# Patient Record
Sex: Female | Born: 1935
Health system: Southern US, Community
[De-identification: ages and names within clinical notes are randomized; demographics above are authoritative.]

## PROBLEM LIST (undated history)

## (undated) DIAGNOSIS — I4891 Unspecified atrial fibrillation: Secondary | ICD-10-CM

## (undated) DIAGNOSIS — I639 Cerebral infarction, unspecified: Secondary | ICD-10-CM

## (undated) DIAGNOSIS — K2981 Duodenitis with bleeding: Secondary | ICD-10-CM

## (undated) DIAGNOSIS — I63411 Cerebral infarction due to embolism of right middle cerebral artery: Secondary | ICD-10-CM

## (undated) DIAGNOSIS — I63422 Cerebral infarction due to embolism of left anterior cerebral artery: Secondary | ICD-10-CM

## (undated) DIAGNOSIS — I10 Essential (primary) hypertension: Secondary | ICD-10-CM

## (undated) DIAGNOSIS — C50912 Malignant neoplasm of unspecified site of left female breast: Secondary | ICD-10-CM

## (undated) DIAGNOSIS — S42201A Unspecified fracture of upper end of right humerus, initial encounter for closed fracture: Secondary | ICD-10-CM

## (undated) DIAGNOSIS — F0390 Unspecified dementia without behavioral disturbance: Secondary | ICD-10-CM

## (undated) DIAGNOSIS — I63433 Cerebral infarction due to embolism of bilateral posterior cerebral arteries: Secondary | ICD-10-CM

## (undated) DIAGNOSIS — N183 Chronic kidney disease, stage 3 unspecified: Secondary | ICD-10-CM

## (undated) DIAGNOSIS — A048 Other specified bacterial intestinal infections: Secondary | ICD-10-CM

## (undated) DIAGNOSIS — K254 Chronic or unspecified gastric ulcer with hemorrhage: Secondary | ICD-10-CM

## (undated) DIAGNOSIS — D649 Anemia, unspecified: Secondary | ICD-10-CM

## (undated) DIAGNOSIS — M1990 Unspecified osteoarthritis, unspecified site: Secondary | ICD-10-CM

## (undated) HISTORY — DX: Cerebral infarction, unspecified: I63.9

## (undated) HISTORY — DX: Cerebral infarction due to embolism of left anterior cerebral artery: I63.422

## (undated) HISTORY — DX: Unspecified atrial fibrillation: I48.91

## (undated) HISTORY — PX: HIP SURGERY: SHX245

## (undated) HISTORY — DX: Cerebral infarction due to embolism of bilateral posterior cerebral arteries: I63.433

## (undated) HISTORY — PX: KNEE ARTHROSCOPY: SUR90

## (undated) HISTORY — DX: Cerebral infarction due to embolism of right middle cerebral artery: I63.411

## (undated) HISTORY — DX: Unspecified fracture of upper end of right humerus, initial encounter for closed fracture: S42.201A

## (undated) HISTORY — DX: Other specified bacterial intestinal infections: A04.8

## (undated) HISTORY — PX: MASTECTOMY: SHX3

## (undated) HISTORY — PX: ABDOMINAL HYSTERECTOMY: SHX81

## (undated) HISTORY — DX: Malignant neoplasm of unspecified site of left female breast: C50.912

---

## 2001-09-07 DIAGNOSIS — C50912 Malignant neoplasm of unspecified site of left female breast: Secondary | ICD-10-CM

## 2001-09-07 HISTORY — DX: Malignant neoplasm of unspecified site of left female breast: C50.912

## 2002-03-03 ENCOUNTER — Other Ambulatory Visit: Admission: RE | Admit: 2002-03-03 | Discharge: 2002-03-03 | Payer: Self-pay | Admitting: Radiology

## 2002-03-03 ENCOUNTER — Encounter: Payer: Self-pay | Admitting: General Surgery

## 2002-03-03 ENCOUNTER — Encounter (INDEPENDENT_AMBULATORY_CARE_PROVIDER_SITE_OTHER): Payer: Self-pay | Admitting: Specialist

## 2002-03-03 ENCOUNTER — Encounter: Admission: RE | Admit: 2002-03-03 | Discharge: 2002-03-03 | Payer: Self-pay | Admitting: General Surgery

## 2002-03-14 ENCOUNTER — Encounter: Payer: Self-pay | Admitting: General Surgery

## 2002-03-16 ENCOUNTER — Inpatient Hospital Stay (HOSPITAL_COMMUNITY): Admission: RE | Admit: 2002-03-16 | Discharge: 2002-03-17 | Payer: Self-pay | Admitting: General Surgery

## 2002-03-16 ENCOUNTER — Encounter: Payer: Self-pay | Admitting: General Surgery

## 2002-03-16 ENCOUNTER — Encounter (INDEPENDENT_AMBULATORY_CARE_PROVIDER_SITE_OTHER): Payer: Self-pay | Admitting: *Deleted

## 2002-03-28 ENCOUNTER — Encounter (INDEPENDENT_AMBULATORY_CARE_PROVIDER_SITE_OTHER): Payer: Self-pay | Admitting: *Deleted

## 2002-03-28 ENCOUNTER — Ambulatory Visit (HOSPITAL_BASED_OUTPATIENT_CLINIC_OR_DEPARTMENT_OTHER): Admission: RE | Admit: 2002-03-28 | Discharge: 2002-03-29 | Payer: Self-pay | Admitting: General Surgery

## 2004-12-05 ENCOUNTER — Ambulatory Visit: Payer: Self-pay | Admitting: Oncology

## 2005-06-08 ENCOUNTER — Ambulatory Visit: Payer: Self-pay | Admitting: Oncology

## 2006-01-15 ENCOUNTER — Ambulatory Visit: Payer: Self-pay | Admitting: Oncology

## 2006-01-18 LAB — COMPREHENSIVE METABOLIC PANEL
ALT: 16 U/L (ref 0–40)
Alkaline Phosphatase: 44 U/L (ref 39–117)
CO2: 28 mEq/L (ref 19–32)
Creatinine, Ser: 1.1 mg/dL (ref 0.4–1.2)
Total Bilirubin: 0.4 mg/dL (ref 0.3–1.2)

## 2006-01-18 LAB — CBC WITH DIFFERENTIAL (CANCER CENTER ONLY)
BASO#: 0.1 10*3/uL (ref 0.0–0.2)
Eosinophils Absolute: 0.1 10*3/uL (ref 0.0–0.5)
HGB: 14.2 g/dL (ref 11.6–15.9)
LYMPH#: 2.4 10*3/uL (ref 0.9–3.3)
MONO#: 0.8 10*3/uL (ref 0.1–0.9)
MONO%: 11.2 % (ref 0.0–13.0)
NEUT#: 3.7 10*3/uL (ref 1.5–6.5)
Platelets: 245 10*3/uL (ref 145–400)
RBC: 5.11 10*6/uL (ref 3.70–5.32)
WBC: 7 10*3/uL (ref 3.9–10.0)

## 2006-01-18 LAB — LACTATE DEHYDROGENASE: LDH: 218 U/L (ref 94–250)

## 2006-01-26 ENCOUNTER — Ambulatory Visit: Payer: Self-pay | Admitting: Internal Medicine

## 2006-01-26 ENCOUNTER — Encounter: Admission: RE | Admit: 2006-01-26 | Discharge: 2006-01-26 | Payer: Self-pay | Admitting: Oncology

## 2006-02-09 ENCOUNTER — Ambulatory Visit: Payer: Self-pay | Admitting: Internal Medicine

## 2006-03-25 ENCOUNTER — Ambulatory Visit: Payer: Self-pay | Admitting: Internal Medicine

## 2006-07-22 ENCOUNTER — Ambulatory Visit: Payer: Self-pay | Admitting: Oncology

## 2006-07-26 LAB — COMPREHENSIVE METABOLIC PANEL
ALT: 17 U/L (ref 0–35)
Albumin: 4.4 g/dL (ref 3.5–5.2)
CO2: 22 mEq/L (ref 19–32)
Calcium: 9.5 mg/dL (ref 8.4–10.5)
Chloride: 108 mEq/L (ref 96–112)
Glucose, Bld: 90 mg/dL (ref 70–99)
Potassium: 4.7 mEq/L (ref 3.5–5.3)
Sodium: 140 mEq/L (ref 135–145)
Total Bilirubin: 0.4 mg/dL (ref 0.3–1.2)
Total Protein: 6.4 g/dL (ref 6.0–8.3)

## 2006-07-26 LAB — CBC WITH DIFFERENTIAL (CANCER CENTER ONLY)
Eosinophils Absolute: 0.1 10*3/uL (ref 0.0–0.5)
LYMPH%: 40.6 % (ref 14.0–48.0)
MCH: 27.3 pg (ref 26.0–34.0)
MCV: 84 fL (ref 81–101)
MONO%: 9.5 % (ref 0.0–13.0)
Platelets: 230 10*3/uL (ref 145–400)
RBC: 4.96 10*6/uL (ref 3.70–5.32)
RDW: 13.1 % (ref 10.5–14.6)

## 2006-07-26 LAB — LACTATE DEHYDROGENASE: LDH: 210 U/L (ref 94–250)

## 2006-07-28 ENCOUNTER — Ambulatory Visit: Payer: Self-pay | Admitting: Internal Medicine

## 2006-09-08 ENCOUNTER — Ambulatory Visit: Payer: Self-pay | Admitting: *Deleted

## 2006-11-09 ENCOUNTER — Ambulatory Visit: Payer: Self-pay | Admitting: *Deleted

## 2007-01-21 ENCOUNTER — Ambulatory Visit: Payer: Self-pay | Admitting: Oncology

## 2007-02-03 LAB — CBC WITH DIFFERENTIAL (CANCER CENTER ONLY)
BASO#: 0 10*3/uL (ref 0.0–0.2)
Eosinophils Absolute: 0.1 10*3/uL (ref 0.0–0.5)
HGB: 13.5 g/dL (ref 11.6–15.9)
LYMPH%: 37.8 % (ref 14.0–48.0)
MCH: 27 pg (ref 26.0–34.0)
MCV: 83 fL (ref 81–101)
MONO#: 0.6 10*3/uL (ref 0.1–0.9)
MONO%: 8.2 % (ref 0.0–13.0)
Platelets: 262 10*3/uL (ref 145–400)
RBC: 4.99 10*6/uL (ref 3.70–5.32)
WBC: 7 10*3/uL (ref 3.9–10.0)

## 2007-02-03 LAB — COMPREHENSIVE METABOLIC PANEL
ALT: 21 U/L (ref 0–35)
AST: 22 U/L (ref 0–37)
Albumin: 4.2 g/dL (ref 3.5–5.2)
BUN: 18 mg/dL (ref 6–23)
CO2: 25 mEq/L (ref 19–32)
Calcium: 9.3 mg/dL (ref 8.4–10.5)
Chloride: 107 mEq/L (ref 96–112)
Potassium: 4.4 mEq/L (ref 3.5–5.3)

## 2007-02-08 ENCOUNTER — Encounter (INDEPENDENT_AMBULATORY_CARE_PROVIDER_SITE_OTHER): Payer: Self-pay | Admitting: *Deleted

## 2007-08-02 ENCOUNTER — Ambulatory Visit: Payer: Self-pay | Admitting: Oncology

## 2007-08-03 ENCOUNTER — Encounter: Payer: Self-pay | Admitting: Family Medicine

## 2007-08-03 LAB — COMPREHENSIVE METABOLIC PANEL
AST: 22 U/L (ref 0–37)
Alkaline Phosphatase: 33 U/L — ABNORMAL LOW (ref 39–117)
BUN: 17 mg/dL (ref 6–23)
Creatinine, Ser: 1.11 mg/dL (ref 0.40–1.20)
Potassium: 4.3 mEq/L (ref 3.5–5.3)

## 2007-08-03 LAB — CBC WITH DIFFERENTIAL (CANCER CENTER ONLY)
Eosinophils Absolute: 0.1 10*3/uL (ref 0.0–0.5)
HCT: 39.8 % (ref 34.8–46.6)
LYMPH%: 33.8 % (ref 14.0–48.0)
MCH: 27.7 pg (ref 26.0–34.0)
MCV: 83 fL (ref 81–101)
MONO#: 0.6 10*3/uL (ref 0.1–0.9)
MONO%: 9 % (ref 0.0–13.0)
NEUT%: 54.6 % (ref 39.6–80.0)
RBC: 4.76 10*6/uL (ref 3.70–5.32)
RDW: 13.1 % (ref 10.5–14.6)
WBC: 6.2 10*3/uL (ref 3.9–10.0)

## 2007-08-16 ENCOUNTER — Encounter: Admission: RE | Admit: 2007-08-16 | Discharge: 2007-08-16 | Payer: Self-pay | Admitting: Orthopedic Surgery

## 2007-08-23 ENCOUNTER — Encounter: Admission: RE | Admit: 2007-08-23 | Discharge: 2007-08-23 | Payer: Self-pay | Admitting: Orthopedic Surgery

## 2007-08-26 ENCOUNTER — Ambulatory Visit (HOSPITAL_BASED_OUTPATIENT_CLINIC_OR_DEPARTMENT_OTHER): Admission: RE | Admit: 2007-08-26 | Discharge: 2007-08-26 | Payer: Self-pay | Admitting: Orthopedic Surgery

## 2007-10-13 ENCOUNTER — Encounter: Payer: Self-pay | Admitting: Family Medicine

## 2007-10-31 ENCOUNTER — Encounter: Admission: RE | Admit: 2007-10-31 | Discharge: 2007-10-31 | Payer: Self-pay | Admitting: Oncology

## 2007-10-31 ENCOUNTER — Encounter (INDEPENDENT_AMBULATORY_CARE_PROVIDER_SITE_OTHER): Payer: Self-pay | Admitting: Diagnostic Radiology

## 2008-01-31 ENCOUNTER — Ambulatory Visit: Payer: Self-pay | Admitting: Oncology

## 2008-02-01 ENCOUNTER — Encounter: Payer: Self-pay | Admitting: Family Medicine

## 2008-10-30 ENCOUNTER — Encounter: Payer: Self-pay | Admitting: Internal Medicine

## 2008-11-05 ENCOUNTER — Encounter (INDEPENDENT_AMBULATORY_CARE_PROVIDER_SITE_OTHER): Payer: Self-pay | Admitting: *Deleted

## 2009-01-28 ENCOUNTER — Ambulatory Visit: Payer: Self-pay | Admitting: Oncology

## 2009-01-31 ENCOUNTER — Encounter: Payer: Self-pay | Admitting: Family Medicine

## 2009-01-31 LAB — CBC WITH DIFFERENTIAL (CANCER CENTER ONLY)
BASO#: 0.1 10*3/uL (ref 0.0–0.2)
Eosinophils Absolute: 0.2 10*3/uL (ref 0.0–0.5)
HGB: 13.6 g/dL (ref 11.6–15.9)
LYMPH%: 34.9 % (ref 14.0–48.0)
MCH: 27.8 pg (ref 26.0–34.0)
MCV: 81 fL (ref 81–101)
MONO%: 8.9 % (ref 0.0–13.0)
RBC: 4.88 10*6/uL (ref 3.70–5.32)

## 2009-01-31 LAB — CMP (CANCER CENTER ONLY)
ALT(SGPT): 20 U/L (ref 10–47)
AST: 25 U/L (ref 11–38)
Alkaline Phosphatase: 40 U/L (ref 26–84)
Sodium: 144 mEq/L (ref 128–145)
Total Bilirubin: 0.6 mg/dl (ref 0.20–1.60)
Total Protein: 6.6 g/dL (ref 6.4–8.1)

## 2009-10-08 ENCOUNTER — Emergency Department (HOSPITAL_COMMUNITY): Admission: EM | Admit: 2009-10-08 | Discharge: 2009-10-08 | Payer: Self-pay | Admitting: Emergency Medicine

## 2009-10-08 ENCOUNTER — Emergency Department (HOSPITAL_COMMUNITY): Admission: EM | Admit: 2009-10-08 | Discharge: 2009-10-08 | Payer: Self-pay | Admitting: Family Medicine

## 2009-10-09 ENCOUNTER — Encounter: Payer: Self-pay | Admitting: Family Medicine

## 2009-10-31 ENCOUNTER — Encounter: Payer: Self-pay | Admitting: Family Medicine

## 2009-11-06 ENCOUNTER — Encounter (INDEPENDENT_AMBULATORY_CARE_PROVIDER_SITE_OTHER): Payer: Self-pay | Admitting: *Deleted

## 2010-01-24 ENCOUNTER — Ambulatory Visit: Payer: Self-pay | Admitting: Oncology

## 2010-01-30 ENCOUNTER — Encounter: Payer: Self-pay | Admitting: Family Medicine

## 2010-01-30 LAB — CBC WITH DIFFERENTIAL/PLATELET
Basophils Absolute: 0 10*3/uL (ref 0.0–0.1)
Eosinophils Absolute: 0.1 10*3/uL (ref 0.0–0.5)
HCT: 40.8 % (ref 34.8–46.6)
LYMPH%: 32.2 % (ref 14.0–49.7)
MCV: 84.2 fL (ref 79.5–101.0)
MONO%: 11.6 % (ref 0.0–14.0)
NEUT#: 4.4 10*3/uL (ref 1.5–6.5)
NEUT%: 54.9 % (ref 38.4–76.8)
Platelets: 230 10*3/uL (ref 145–400)
RBC: 4.85 10*6/uL (ref 3.70–5.45)

## 2010-01-30 LAB — CMP (CANCER CENTER ONLY)
Alkaline Phosphatase: 42 U/L (ref 26–84)
Creat: 1.1 mg/dl (ref 0.6–1.2)
Glucose, Bld: 93 mg/dL (ref 73–118)
Sodium: 136 mEq/L (ref 128–145)
Total Bilirubin: 0.6 mg/dl (ref 0.20–1.60)
Total Protein: 6.5 g/dL (ref 6.4–8.1)

## 2010-07-08 DIAGNOSIS — S42201A Unspecified fracture of upper end of right humerus, initial encounter for closed fracture: Secondary | ICD-10-CM

## 2010-07-08 HISTORY — PX: SHOULDER HEMI-ARTHROPLASTY: SHX5049

## 2010-07-08 HISTORY — DX: Unspecified fracture of upper end of right humerus, initial encounter for closed fracture: S42.201A

## 2010-07-24 ENCOUNTER — Emergency Department (HOSPITAL_COMMUNITY): Admission: EM | Admit: 2010-07-24 | Discharge: 2010-07-24 | Payer: Self-pay | Admitting: Emergency Medicine

## 2010-07-25 ENCOUNTER — Encounter: Admission: RE | Admit: 2010-07-25 | Discharge: 2010-07-25 | Payer: Self-pay | Admitting: Orthopedic Surgery

## 2010-07-26 ENCOUNTER — Inpatient Hospital Stay (HOSPITAL_COMMUNITY): Admission: RE | Admit: 2010-07-26 | Discharge: 2010-07-28 | Payer: Self-pay | Admitting: Orthopedic Surgery

## 2010-07-26 ENCOUNTER — Ambulatory Visit: Payer: Self-pay | Admitting: Internal Medicine

## 2010-07-26 ENCOUNTER — Ambulatory Visit: Payer: Self-pay | Admitting: Cardiology

## 2010-07-27 ENCOUNTER — Encounter (INDEPENDENT_AMBULATORY_CARE_PROVIDER_SITE_OTHER): Payer: Self-pay | Admitting: Internal Medicine

## 2010-08-13 ENCOUNTER — Encounter: Payer: Self-pay | Admitting: Family Medicine

## 2010-08-18 ENCOUNTER — Encounter
Admission: RE | Admit: 2010-08-18 | Discharge: 2010-08-18 | Payer: Self-pay | Source: Home / Self Care | Attending: Orthopedic Surgery | Admitting: Orthopedic Surgery

## 2010-09-03 ENCOUNTER — Encounter: Payer: Self-pay | Admitting: Family Medicine

## 2010-09-10 ENCOUNTER — Encounter
Admission: RE | Admit: 2010-09-10 | Discharge: 2010-10-07 | Payer: Self-pay | Source: Home / Self Care | Attending: Orthopedic Surgery | Admitting: Orthopedic Surgery

## 2010-09-24 ENCOUNTER — Ambulatory Visit: Admit: 2010-09-24 | Payer: Self-pay | Admitting: Internal Medicine

## 2010-09-28 ENCOUNTER — Encounter: Payer: Self-pay | Admitting: Sports Medicine

## 2010-10-01 ENCOUNTER — Encounter: Payer: Self-pay | Admitting: Family Medicine

## 2010-10-07 NOTE — Letter (Signed)
Summary: [facility]  [facility]   Imported By: Lanelle Bal 10/17/2009 10:33:00  _____________________________________________________________________  External Attachment:    Type:   Image     Comment:   External Document

## 2010-10-07 NOTE — Letter (Signed)
[facility]   Imported By: Lester Slaughters 02/22/2010 08:21:38  _____________________________________________________________________  External Attachment:    Type:   Image     Comment:   External Document

## 2010-10-07 NOTE — Letter (Signed)
Summary: Results Follow up Letter  [facility]  [address] [place], [place] [address]   Phone: [phone]  Fax: [phone]    11/06/2009 MRN: [id number]    [facility] [address] [place], [place]  [address]    Dear Katie Silva,  The following are the results of your recent test(s):  Test         Result    Pap Smear:        Normal _____  Not Normal _____ Comments: ______________________________________________________ Cholesterol: LDL(Bad cholesterol):         Your goal is less than:         HDL (Good cholesterol):       Your goal is more than: Comments:  ______________________________________________________ Mammogram:        Normal __X___  Not Normal _____ Comments:  Yearly follow up is recommended.   ___________________________________________________________________ Hemoccult:        Normal _____  Not normal _______ Comments:    _____________________________________________________________________ Other Tests:    We routinely do not discuss normal results over the telephone.  If you desire a copy of the results, or you have any questions about this information we can discuss them at your next office visit.   Sincerely,   Marne A. Milinda Antis, M.D.  MAT:lsf

## 2010-10-07 NOTE — Miscellaneous (Signed)
  Clinical Lists Changes  Observations: Added new observation of MAMMO DUE: 10/31/2010 (10/31/2009 16:45) Added new observation of MAMMOGRAM: Normal (10/31/2009 16:45)

## 2010-10-09 NOTE — Letter (Signed)
Summary: [facility]  [facility]   Imported By: Lanelle Bal 08/19/2010 14:04:45  _____________________________________________________________________  External Attachment:    Type:   Image     Comment:   External Document  Appended Document: [facility]    Clinical Lists Changes  Observations: Added new observation of PAST MED HX: R proximal humerus fx with surgery  [facility] (08/24/2010 20:51)       Past Medical History:    R proximal humerus fx with surgery        [facility]

## 2010-10-09 NOTE — Letter (Signed)
Summary: [facility]  [facility]   Imported By: Maryln Gottron 09/10/2010 10:12:08  _____________________________________________________________________  External Attachment:    Type:   Image     Comment:   External Document

## 2010-10-14 ENCOUNTER — Ambulatory Visit: Payer: Medicare PPO | Attending: Orthopedic Surgery | Admitting: Physical Therapy

## 2010-10-14 DIAGNOSIS — IMO0001 Reserved for inherently not codable concepts without codable children: Secondary | ICD-10-CM | POA: Insufficient documentation

## 2010-10-14 DIAGNOSIS — M25619 Stiffness of unspecified shoulder, not elsewhere classified: Secondary | ICD-10-CM | POA: Insufficient documentation

## 2010-10-14 DIAGNOSIS — M25519 Pain in unspecified shoulder: Secondary | ICD-10-CM | POA: Insufficient documentation

## 2010-10-16 ENCOUNTER — Encounter: Payer: Medicare PPO | Admitting: Physical Therapy

## 2010-10-21 ENCOUNTER — Ambulatory Visit: Payer: Medicare PPO | Admitting: Physical Therapy

## 2010-10-23 NOTE — Letter (Signed)
Summary: [facility]  [facility]   Imported By: Kassie Mends 10/10/2010 11:02:03  _____________________________________________________________________  External Attachment:    Type:   Image     Comment:   External Document

## 2010-10-28 ENCOUNTER — Ambulatory Visit: Payer: Medicare PPO | Admitting: Physical Therapy

## 2010-10-29 ENCOUNTER — Encounter: Payer: Self-pay | Admitting: Family Medicine

## 2010-11-07 ENCOUNTER — Encounter: Payer: Self-pay | Admitting: Family Medicine

## 2010-11-18 LAB — MAGNESIUM: Magnesium: 1.9 mg/dL (ref 1.5–2.5)

## 2010-11-18 LAB — BASIC METABOLIC PANEL
BUN: 11 mg/dL (ref 6–23)
BUN: 12 mg/dL (ref 6–23)
BUN: 16 mg/dL (ref 6–23)
CO2: 26 mEq/L (ref 19–32)
Calcium: 9.1 mg/dL (ref 8.4–10.5)
Chloride: 105 mEq/L (ref 96–112)
Chloride: 106 mEq/L (ref 96–112)
Chloride: 107 mEq/L (ref 96–112)
Creatinine, Ser: 0.9 mg/dL (ref 0.4–1.2)
Creatinine, Ser: 0.92 mg/dL (ref 0.4–1.2)
GFR calc Af Amer: >60 mL/min (ref >60)
GFR calc non Af Amer: 53 mL/min — ABNORMAL LOW (ref >60)
GFR calc non Af Amer: 58 mL/min — ABNORMAL LOW (ref >60)
Glucose, Bld: 102 mg/dL — ABNORMAL HIGH (ref 70–99)
Glucose, Bld: 113 mg/dL — ABNORMAL HIGH (ref 70–99)
Potassium: 4.2 mEq/L (ref 3.5–5.1)
Potassium: 4.2 mEq/L (ref 3.5–5.1)
Sodium: 137 mEq/L (ref 135–145)

## 2010-11-18 LAB — CARDIAC PANEL(CRET KIN+CKTOT+MB+TROPI)
CK, MB: 1.6 ng/mL (ref 0.3–4.0)
CK, MB: 2.3 ng/mL (ref 0.3–4.0)
CK, MB: 3.1 ng/mL (ref 0.3–4.0)
Relative Index: 0.5 (ref 0.0–2.5)
Relative Index: 0.6 (ref 0.0–2.5)
Relative Index: 0.7 (ref 0.0–2.5)
Total CK: 300 U/L — ABNORMAL HIGH (ref 7–177)
Total CK: 372 U/L — ABNORMAL HIGH (ref 7–177)
Total CK: 451 U/L — ABNORMAL HIGH (ref 7–177)
Troponin I: 0.02 ng/mL (ref 0.00–0.06)
Troponin I: 0.02 ng/mL (ref 0.00–0.06)
Troponin I: 0.03 ng/mL (ref 0.00–0.06)

## 2010-11-18 LAB — PROTIME-INR: Prothrombin Time: 14.4 seconds (ref 11.6–15.2)

## 2010-11-18 LAB — CBC
HCT: 34.1 % — ABNORMAL LOW (ref 36.0–46.0)
HCT: 34.3 % — ABNORMAL LOW (ref 36.0–46.0)
Hemoglobin: 10.7 g/dL — ABNORMAL LOW (ref 12.0–15.0)
MCH: 28.7 pg (ref 26.0–34.0)
MCHC: 31.7 g/dL (ref 30.0–36.0)
MCHC: 33.2 g/dL (ref 30.0–36.0)
MCV: 86.4 fL (ref 78.0–100.0)
MCV: 86.8 fL (ref 78.0–100.0)
MCV: 86.8 fL (ref 78.0–100.0)
Platelets: 204 10*3/uL (ref 150–400)
Platelets: 219 10*3/uL (ref 150–400)
RBC: 3.95 MIL/uL (ref 3.87–5.11)
RDW: 14.3 % (ref 11.5–15.5)
RDW: 14.3 % (ref 11.5–15.5)
WBC: 8.6 10*3/uL (ref 4.0–10.5)
WBC: 9.5 10*3/uL (ref 4.0–10.5)

## 2010-11-18 LAB — BRAIN NATRIURETIC PEPTIDE: Pro B Natriuretic peptide (BNP): 118 pg/mL — ABNORMAL HIGH (ref 0.0–100.0)

## 2010-11-18 LAB — TSH: TSH: 1.907 u[IU]/mL (ref 0.350–4.500)

## 2010-11-18 NOTE — Letter (Signed)
Summary: [facility]  [facility]   Imported By: Kassie Mends 11/04/2010 10:26:37  _____________________________________________________________________  External Attachment:    Type:   Image     Comment:   External Document

## 2010-11-26 LAB — URINALYSIS, ROUTINE W REFLEX MICROSCOPIC
Specific Gravity, Urine: 1.005 (ref 1.005–1.030)
Urobilinogen, UA: 0.2 mg/dL (ref 0.0–1.0)
pH: 5.5 (ref 5.0–8.0)

## 2010-11-26 LAB — COMPREHENSIVE METABOLIC PANEL
ALT: 39 U/L — ABNORMAL HIGH (ref 0–35)
Alkaline Phosphatase: 44 U/L (ref 39–117)
CO2: 26 mEq/L (ref 19–32)
GFR calc non Af Amer: 56 mL/min — ABNORMAL LOW (ref >60)
Glucose, Bld: 100 mg/dL — ABNORMAL HIGH (ref 70–99)
Potassium: 4.1 mEq/L (ref 3.5–5.1)
Sodium: 139 mEq/L (ref 135–145)

## 2010-11-26 LAB — URINE MICROSCOPIC-ADD ON

## 2010-11-26 LAB — CBC
Hemoglobin: 13.5 g/dL (ref 12.0–15.0)
MCHC: 33.1 g/dL (ref 30.0–36.0)
MCV: 83.9 fL (ref 78.0–100.0)
RBC: 4.84 MIL/uL (ref 3.87–5.11)
RDW: 14 % (ref 11.5–15.5)

## 2010-11-26 LAB — DIFFERENTIAL
Basophils Absolute: 0 10*3/uL (ref 0.0–0.1)
Basophils Relative: 0 % (ref 0–1)
Eosinophils Absolute: 0 10*3/uL (ref 0.0–0.7)
Neutrophils Relative %: 61 % (ref 43–77)

## 2010-11-26 LAB — PROTIME-INR
INR: 1.04 (ref 0.00–1.49)
Prothrombin Time: 13.5 seconds (ref 11.6–15.2)

## 2010-11-26 LAB — URINE CULTURE: Colony Count: 5000

## 2010-11-26 LAB — CK TOTAL AND CKMB (NOT AT ARMC): Relative Index: INVALID (ref 0.0–2.5)

## 2011-01-20 NOTE — Op Note (Signed)
NAMEMICHELINA, Katie Silva              ACCOUNT NO.:  [id number]   MEDICAL RECORD NO.:  [id number]          PATIENT TYPE:  AMB   LOCATION:  DSC                          FACILITY:  [facility]   PHYSICIAN:  John L. Rendall, M.D.  DATE OF BIRTH:  1925-11-25   DATE OF PROCEDURE:  08/26/2007  DATE OF DISCHARGE:                               OPERATIVE REPORT   PREOPERATIVE DIAGNOSIS:  Torn lateral meniscus with loose bodies.   POSTOPERATIVE DIAGNOSIS:  Torn lateral meniscus with loose bodies.   SURGICAL PROCEDURE:  Lateral meniscectomy and removal of calcified loose  body.   SURGEON:  John L. Rendall, M.D.   ANESTHESIA:  General.   PROCEDURE:  Under brief general anesthetic, the left leg was prepared  with DuraPrep and draped as a sterile field.  Portals were injected with  0.25% Xylocaine with epinephrine.  Diagnostic arthroscopy reveals a  complex tear of the lateral meniscus. Using a combination of basket  forceps and intra-articular shaver, this was resected back to a stable  meniscal rim.  The medial meniscus was intact.  The patellofemoral  articulation shows very little degenerative change, loose bodies found  in the inner condylar notch in front of the ACL, this was grasped with  the pituitary and withdrawn from the knee.  The remainder of the knee is  carefully examined and the recesses looking for other loose bodies, none  were seen.  The knee is then infiltrated with Marcaine -.25% plain with  epinephrine, 10 mL, and a sterile compression bandage was applied.  The  patient returned to recovery in good condition.      John L. Rendall, M.D.  Electronically Signed     JLR/MEDQ  D:  08/26/2007  T:  11-28-2006  Job:  [id number]

## 2011-01-23 NOTE — Op Note (Signed)
NAMEJUANITA, STREIGHT              ACCOUNT NO.:  [id number]   MEDICAL RECORD NO.:  [id number]          PATIENT TYPE:  AMB   LOCATION:  DSC                          FACILITY:  [facility]   PHYSICIAN:  John L. Rendall, M.D.  DATE OF BIRTH:  09/18/1925   DATE OF PROCEDURE:  DATE OF DISCHARGE:  08/26/2007                               OPERATIVE REPORT   ADDENDUM   OPERATIVE REPORT:  Multiple loose bodies were encountered in the knee.  Majority of them were removed with the irrigation system and intra-  articular shaver.  The largest was grasped with a pituitary and  withdrawn from the knee measuring approximately 6-7 mm in size.      John L. Rendall, M.D.  Electronically Signed     JLR/MEDQ  D:  01/05/2008  T:  01/06/2008  Job:  [id number]

## 2011-01-23 NOTE — Assessment & Plan Note (Signed)
[facility]                                 ON-CALL NOTE   JOHNICA, ARMWOOD                       MRN:          [id number]  DATE:10/22/2006                            DOB:          [birth year]    Phone number [phone].   SUBJECTIVE:  [age]-year-old female with headache, stuffed nose,  some shortness of breath earlier today, but is somewhat improved now.  She does have cough and subjective fever.  She feels very ill.   ASSESSMENT AND PLAN:  Given shortness of breath earlier as well as  patient's age, I recommended she at least be seen by an urgent care  clinic this evening.  If she has more shortness of breath she would go  to the emergency room.     Kerby Nora, MD  Electronically Signed    AB/MedQ  DD: 10/22/2006  DT: 10/23/2006  Job #: [id number]

## 2011-01-30 ENCOUNTER — Encounter: Payer: Self-pay | Admitting: Family Medicine

## 2011-01-30 ENCOUNTER — Ambulatory Visit: Payer: Medicare PPO | Admitting: Family Medicine

## 2011-01-30 ENCOUNTER — Ambulatory Visit (INDEPENDENT_AMBULATORY_CARE_PROVIDER_SITE_OTHER): Payer: Medicare PPO | Admitting: Family Medicine

## 2011-01-30 VITALS — BP 150/90 | HR 64 | Temp 98.2°F | Wt 149.8 lb

## 2011-01-30 DIAGNOSIS — I10 Essential (primary) hypertension: Secondary | ICD-10-CM | POA: Insufficient documentation

## 2011-01-30 DIAGNOSIS — R634 Abnormal weight loss: Secondary | ICD-10-CM

## 2011-01-30 DIAGNOSIS — Z136 Encounter for screening for cardiovascular disorders: Secondary | ICD-10-CM

## 2011-01-30 DIAGNOSIS — S42201A Unspecified fracture of upper end of right humerus, initial encounter for closed fracture: Secondary | ICD-10-CM

## 2011-01-30 DIAGNOSIS — C50912 Malignant neoplasm of unspecified site of left female breast: Secondary | ICD-10-CM

## 2011-01-30 DIAGNOSIS — M810 Age-related osteoporosis without current pathological fracture: Secondary | ICD-10-CM | POA: Insufficient documentation

## 2011-01-30 DIAGNOSIS — C50919 Malignant neoplasm of unspecified site of unspecified female breast: Secondary | ICD-10-CM

## 2011-01-30 DIAGNOSIS — S42209A Unspecified fracture of upper end of unspecified humerus, initial encounter for closed fracture: Secondary | ICD-10-CM

## 2011-01-30 LAB — LIPID PANEL
HDL: 59 mg/dL (ref >39)
LDL Cholesterol: 99 mg/dL (ref 0–99)
Total CHOL/HDL Ratio: 3.2 Ratio

## 2011-01-30 LAB — BASIC METABOLIC PANEL
Calcium: 9.7 mg/dL (ref 8.4–10.5)
Glucose, Bld: 83 mg/dL (ref 70–99)
Sodium: 141 mEq/L (ref 135–145)

## 2011-01-30 NOTE — Progress Notes (Signed)
77 yo pleasant female here to establish care. Here with her daughter today.  Only complaint is weight loss. Since she got false teeth several months ago, she has lost approximately 20 pounds. She says the fit is good, does not hurt to chew but she has to eat slower. Feels like her weight is better now but daughter is concerned. Denies fatigue, SOB, or LE edema.    H/o left breast CA- s/p mastectomy and chemotherapy. Followed by Dr. Park Breed. Had neg mammogram in 11/2010.  Very independent. Lives alone.  Larey Seat and fractured her humerus in 11/11 and since then, her daughter lives behind her. She is very involved in her care but Ms. Goodpasture is independent for all ADLs. On Fosamax since her fall.  The PMH, PSH, Social History, Family History, Medications, and allergies have been reviewed in [facility], and have been updated if relevant.  ROS: See HPI Patient reports no  vision/ hearing changes,anorexia, fever ,adenopathy, persistant / recurrent hoarseness, swallowing issues, chest pain, edema,persistant / recurrent cough, hemoptysis, dyspnea(rest, exertional, paroxysmal nocturnal), gastrointestinal  bleeding (melena, rectal bleeding), abdominal pain, excessive heart burn, GU symptoms(dysuria, hematuria, pyuria, voiding/incontinence  Issues) syncope, focal weakness, severe memory loss, concerning skin lesions, depression, anxiety, abnormal bruising/bleeding, major joint swelling, breast masses or abnormal vaginal bleeding.    Physical exam: BP 150/90  Pulse 64  Temp(Src) 98.2 F (36.8 C) (Oral)  Wt 149 lb 12.8 oz (67.949 kg) Gen:  Alert, very pleasant, appears younger than stated age. HEENT:  Supple, nontender, no adenopathy Dentures appear to fit well Resp:  CTA bilaterally CVS:  RRR, no MRG Ext:  No edema.  Decreased ROM of right shoulder, no pain.  A/P- 1.  Weight loss- appears to be secondary to her dentures, although she denies that her dentures are a poor fit or causing discomfort. Will  order some labs today to rule out other possible causes of weight loss. Orders Placed This Encounter  . TSH  . Basic Metabolic Panel (BMET)  . Lipid panel   2.  Breast CA- stable, followed by Dr. Park Breed.  3.  HTN- BP elevated today but admits to white coat HTN. Reviewed BPs from her hospitalization in November 2011 and they appeared normal. Will not start antihypertensives today but I did suggest purchasing a home blood pressure cuff.  4.  S/p fracture of right humerus- doing well, no pain and reasonable ROM. Released from Dr. Shelba Flake care this week.

## 2011-01-30 NOTE — Patient Instructions (Signed)
Great to meet you. Please make an appointment for you medicare physical at your convenience.

## 2011-01-30 NOTE — Progress Notes (Signed)
Addended by: Jobie Quaker on: 01/30/2011 04:05 PM   Modules accepted: Orders

## 2011-03-30 ENCOUNTER — Encounter (HOSPITAL_BASED_OUTPATIENT_CLINIC_OR_DEPARTMENT_OTHER): Payer: Medicare PPO | Admitting: Oncology

## 2011-03-30 ENCOUNTER — Other Ambulatory Visit: Payer: Self-pay | Admitting: Oncology

## 2011-03-30 DIAGNOSIS — C50919 Malignant neoplasm of unspecified site of unspecified female breast: Secondary | ICD-10-CM

## 2011-03-30 DIAGNOSIS — Z170 Estrogen receptor positive status [ER+]: Secondary | ICD-10-CM

## 2011-03-30 LAB — COMPREHENSIVE METABOLIC PANEL
ALT: 15 U/L (ref 0–35)
Albumin: 4.2 g/dL (ref 3.5–5.2)
CO2: 25 mEq/L (ref 19–32)
Calcium: 9.3 mg/dL (ref 8.4–10.5)
Chloride: 105 mEq/L (ref 96–112)
Creatinine, Ser: 0.99 mg/dL (ref 0.50–1.10)
Sodium: 139 mEq/L (ref 135–145)
Total Protein: 6.6 g/dL (ref 6.0–8.3)

## 2011-03-30 LAB — CBC WITH DIFFERENTIAL/PLATELET
BASO%: 0.3 % (ref 0.0–2.0)
HCT: 41.4 % (ref 34.8–46.6)
MCHC: 32.5 g/dL (ref 31.5–36.0)
MONO#: 0.7 10*3/uL (ref 0.1–0.9)
NEUT%: 57.4 % (ref 38.4–76.8)
WBC: 7.4 10*3/uL (ref 3.9–10.3)
lymph#: 2.3 10*3/uL (ref 0.9–3.3)

## 2011-08-05 ENCOUNTER — Ambulatory Visit (INDEPENDENT_AMBULATORY_CARE_PROVIDER_SITE_OTHER): Payer: Medicare PPO | Admitting: Family Medicine

## 2011-08-05 ENCOUNTER — Encounter: Payer: Self-pay | Admitting: Family Medicine

## 2011-08-05 VITALS — BP 142/90 | HR 80 | Temp 97.8°F | Wt 146.0 lb

## 2011-08-05 DIAGNOSIS — J069 Acute upper respiratory infection, unspecified: Secondary | ICD-10-CM

## 2011-08-05 DIAGNOSIS — I10 Essential (primary) hypertension: Secondary | ICD-10-CM

## 2011-08-05 MED ORDER — AZITHROMYCIN 250 MG PO TABS: ORAL_TABLET | ORAL | Status: AC

## 2011-08-05 NOTE — Patient Instructions (Addendum)
Take antibiotic as directed.  Drink lots of fluids.  Treat sympotmatically with Mucinex, nasal saline irrigation, and Tylenol/Ibuprofen. Call if not improving as expected in 5-7 days.   Keep me posted with your BP readings- BP looks good today.

## 2011-08-05 NOTE — Progress Notes (Signed)
SUBJECTIVE:  Katie Silva is a 77 y.o. female who complains of coryza, congestion, sneezing, sore throat and bilateral sinus pain for 14 days. She denies a history of anorexia, chest pain, fatigue and fevers and denies a history of asthma. Patient denies smoke cigarettes.   Has been under great deal of stress lately. Her oldest daughter is dying of Stomach cancer. Her BP was a little elevated over the weekend- systolic in 160s but feels better now. No CP, SOB, LE edema, or blurred vision.  Patient Active Problem List  Diagnoses  . Breast cancer, left breast  . Fracture of humerus, proximal, right, closed  . Osteoporosis  . HTN (hypertension)  . URI (upper respiratory infection)   Past Medical History  Diagnosis Date  . Breast cancer, left breast 2003    s/p mastectomy, chemo.  Dr. Park Breed oncologist  . Fracture of humerus, proximal, right, closed 07/2010    s/p hemiarthroplasty  . Osteoporosis    Past Surgical History  Procedure Date  . Shoulder hemi-arthroplasty 07/2010    Dr. Dion Saucier   History  Substance Use Topics  . Smoking status: Never Smoker   . Smokeless tobacco: Not on file  . Alcohol Use: Not on file   Family History  Problem Relation Age of Onset  . Myasthenia gravis Mother    Allergies  Allergen Reactions  . Allegra Other (See Comments)    Doesn't agree with patient   Current Outpatient Prescriptions on File Prior to Visit  Medication Sig Dispense Refill  . alendronate (FOSAMAX) 70 MG tablet Take 70 mg by mouth every 7 (seven) days. Take with a full glass of water on an empty stomach.       [name] aspirin 325 MG tablet Take 325 mg by mouth daily.        . Calcium Carbonate-Vitamin D (CALTRATE 600+D) 600-400 MG-UNIT per tablet Take 1 tablet by mouth daily.        [name] L-Lysine 500 MG CAPS Take 1 capsule by mouth daily.        . vitamin B-12 (CYANOCOBALAMIN) 100 MCG tablet Take 50 mcg by mouth daily.        . vitamin C (ASCORBIC ACID) 500 MG tablet Take 500 mg by  mouth daily.         The PMH, PSH, Social History, Family History, Medications, and allergies have been reviewed in [facility], and have been updated if relevant.  OBJECTIVE: BP 142/90  Pulse 80  Temp(Src) 97.8 F (36.6 C) (Oral)  Wt 146 lb (66.225 kg)  She appears well, vital signs are as noted. Ears normal.  Throat and pharynx normal.  Neck supple. No adenopathy in the neck. Nose is congested. Sinuses TTP throughout.  The chest is clear, without wheezes or rales.  ASSESSMENT:  sinusitis  PLAN: Given duration and progression of symptoms, will treat for bacterial sinusitis. Symptomatic therapy suggested: push fluids, rest and return office visit prn if symptoms persist or worsen.Call or return to clinic prn if these symptoms worsen or fail to improve as anticipated.

## 2011-08-22 ENCOUNTER — Telehealth: Payer: Self-pay | Admitting: Oncology

## 2011-08-22 NOTE — Telephone Encounter (Signed)
S/w the pt and she is aware of her July 2013 appts °

## 2011-09-21 ENCOUNTER — Ambulatory Visit (INDEPENDENT_AMBULATORY_CARE_PROVIDER_SITE_OTHER): Payer: Medicare PPO | Admitting: Family Medicine

## 2011-09-21 ENCOUNTER — Encounter: Payer: Self-pay | Admitting: Family Medicine

## 2011-09-21 VITALS — BP 142/84 | HR 56 | Temp 97.9°F | Wt 152.5 lb

## 2011-09-21 DIAGNOSIS — J069 Acute upper respiratory infection, unspecified: Secondary | ICD-10-CM

## 2011-09-21 MED ORDER — AZITHROMYCIN 250 MG PO TABS: ORAL_TABLET | ORAL | Status: DC

## 2011-09-21 NOTE — Patient Instructions (Signed)
Take antibiotic as directed.  Drink lots of fluids.  Treat sympotmatically with Mucinex, nasal saline irrigation, and Tylenol/Ibuprofen. Also try claritin D or zyrtec D over the counter- two times a day as needed ( have to sign for them at pharmacy). You can use warm compresses.  Cough suppressant at night. Call if not improving as expected in 5-7 days.    

## 2011-09-21 NOTE — Progress Notes (Signed)
SUBJECTIVE:  Katie Silva is a 78 y.o. female who complains of coryza, congestion and dry cough for 12 days. She denies a history of anorexia, chest pain and fatigue and denies a history of asthma. Patient denies smoke cigarettes.   Patient Active Problem List  Diagnoses  . Breast cancer, left breast  . Fracture of humerus, proximal, right, closed  . Osteoporosis  . HTN (hypertension)  . URI (upper respiratory infection)   Past Medical History  Diagnosis Date  . Breast cancer, left breast 2003    s/p mastectomy, chemo.  Dr. Park Breed oncologist  . Fracture of humerus, proximal, right, closed 07/2010    s/p hemiarthroplasty  . Osteoporosis    Past Surgical History  Procedure Date  . Shoulder hemi-arthroplasty 07/2010    Dr. Dion Saucier   History  Substance Use Topics  . Smoking status: Never Smoker   . Smokeless tobacco: Not on file  . Alcohol Use: Not on file   Family History  Problem Relation Age of Onset  . Myasthenia gravis Mother    Allergies  Allergen Reactions  . Allegra Other (See Comments)    Doesn't agree with patient   Current Outpatient Prescriptions on File Prior to Visit  Medication Sig Dispense Refill  . alendronate (FOSAMAX) 70 MG tablet Take 70 mg by mouth every 7 (seven) days. Take with a full glass of water on an empty stomach.       [name] aspirin 325 MG tablet Take 325 mg by mouth daily.        . Calcium Carbonate-Vitamin D (CALTRATE 600+D) 600-400 MG-UNIT per tablet Take 1 tablet by mouth daily.        [name] L-Lysine 500 MG CAPS Take 1 capsule by mouth daily.        . vitamin B-12 (CYANOCOBALAMIN) 100 MCG tablet Take 50 mcg by mouth daily.        . vitamin C (ASCORBIC ACID) 500 MG tablet Take 500 mg by mouth daily.         The PMH, PSH, Social History, Family History, Medications, and allergies have been reviewed in [facility], and have been updated if relevant.  OBJECTIVE: BP 142/84  Pulse 56  Temp(Src) 97.9 F (36.6 C) (Oral)  Wt 152 lb 8 oz (69.174 kg)  She  appears well, vital signs are as noted. Ears normal.  Throat and pharynx normal.  Neck supple. No adenopathy in the neck. Nose is congested. Sinuses tender. The chest is clear, without wheezes or rales.  ASSESSMENT:  sinusitis  PLAN: Symptomatic therapy suggested: push fluids, rest and return office visit prn if symptoms persist or worsen.  Call or return to clinic prn if these symptoms worsen or fail to improve as anticipated.

## 2011-09-26 ENCOUNTER — Ambulatory Visit (INDEPENDENT_AMBULATORY_CARE_PROVIDER_SITE_OTHER): Payer: Medicare PPO

## 2011-09-26 DIAGNOSIS — R233 Spontaneous ecchymoses: Secondary | ICD-10-CM

## 2011-09-26 DIAGNOSIS — S1093XA Contusion of unspecified part of neck, initial encounter: Secondary | ICD-10-CM

## 2011-09-26 DIAGNOSIS — S61409A Unspecified open wound of unspecified hand, initial encounter: Secondary | ICD-10-CM

## 2011-09-26 DIAGNOSIS — S0003XA Contusion of scalp, initial encounter: Secondary | ICD-10-CM

## 2011-10-06 ENCOUNTER — Ambulatory Visit (INDEPENDENT_AMBULATORY_CARE_PROVIDER_SITE_OTHER): Payer: Medicare PPO | Admitting: Family Medicine

## 2011-10-06 VITALS — BP 120/76 | HR 80 | Temp 98.0°F | Resp 16 | Ht 63.0 in | Wt 150.0 lb

## 2011-10-06 DIAGNOSIS — IMO0002 Reserved for concepts with insufficient information to code with codable children: Secondary | ICD-10-CM

## 2011-10-06 DIAGNOSIS — Z4802 Encounter for removal of sutures: Secondary | ICD-10-CM

## 2011-10-06 NOTE — Progress Notes (Signed)
  Subjective:    Patient ID: Katie Silva, female    DOB: [birth year], 78 y.o.   MRN: [id number]  Suture / Staple Removal The sutures were placed 7 to 10 days ago. She tried regular soap and water washings since the wound repair. The treatment provided significant relief. The maximum temperature noted was less than 100.4 F. There has been no drainage from the wound. The redness has improved. There is no swelling present. The pain has no pain. She has no difficulty moving the affected extremity or digit.      Review of Systems  Constitutional: Negative.   Respiratory: Negative.   Cardiovascular: Negative.   Skin: Positive for wound. Negative for color change, pallor and rash.       Objective:   Physical Exam  Constitutional: She is oriented to person, place, and time. She appears well-developed and well-nourished.  HENT:  Head: Normocephalic.  Eyes: Pupils are equal, round, and reactive to light.  Neck: Normal range of motion. Neck supple. No thyromegaly present.  Cardiovascular: Normal rate and regular rhythm.   Murmur heard. Pulmonary/Chest: Effort normal.  Abdominal: Soft. Bowel sounds are normal.  Musculoskeletal: Normal range of motion.  Neurological: She is alert and oriented to person, place, and time.  Skin: Skin is warm. No rash noted. There is erythema. No pallor.  Psychiatric: She has a normal mood and affect.   Systolic heart murmur at upper right and left sternal border       Assessment & Plan:  1. Left hand wound laceration  S/p suture placement-wound check good, good approximation, no signs of infection, cont with wound care soap and water BID. 3 Suture removal without complication.  2. H/o heart murmur-no treatment.  3. F/u prn

## 2011-10-06 NOTE — Patient Instructions (Signed)
Wound care BID

## 2011-11-09 ENCOUNTER — Other Ambulatory Visit: Payer: Self-pay | Admitting: Oncology

## 2011-11-09 DIAGNOSIS — C50919 Malignant neoplasm of unspecified site of unspecified female breast: Secondary | ICD-10-CM

## 2011-11-09 DIAGNOSIS — M810 Age-related osteoporosis without current pathological fracture: Secondary | ICD-10-CM

## 2011-11-10 ENCOUNTER — Encounter: Payer: Self-pay | Admitting: Family Medicine

## 2011-11-12 ENCOUNTER — Encounter: Payer: Self-pay | Admitting: Family Medicine

## 2011-11-12 ENCOUNTER — Encounter: Payer: Self-pay | Admitting: *Deleted

## 2012-03-30 ENCOUNTER — Other Ambulatory Visit: Payer: Self-pay | Admitting: Medical Oncology

## 2012-03-30 DIAGNOSIS — C50919 Malignant neoplasm of unspecified site of unspecified female breast: Secondary | ICD-10-CM

## 2012-03-31 ENCOUNTER — Encounter: Payer: Self-pay | Admitting: Oncology

## 2012-03-31 ENCOUNTER — Other Ambulatory Visit (HOSPITAL_BASED_OUTPATIENT_CLINIC_OR_DEPARTMENT_OTHER): Payer: Medicare PPO | Admitting: Lab

## 2012-03-31 ENCOUNTER — Telehealth: Payer: Self-pay | Admitting: Oncology

## 2012-03-31 ENCOUNTER — Ambulatory Visit (HOSPITAL_BASED_OUTPATIENT_CLINIC_OR_DEPARTMENT_OTHER): Payer: Medicare PPO | Admitting: Oncology

## 2012-03-31 VITALS — BP 192/82 | HR 58 | Temp 97.9°F | Ht 63.0 in | Wt 142.1 lb

## 2012-03-31 DIAGNOSIS — C50912 Malignant neoplasm of unspecified site of left female breast: Secondary | ICD-10-CM

## 2012-03-31 DIAGNOSIS — C50919 Malignant neoplasm of unspecified site of unspecified female breast: Secondary | ICD-10-CM

## 2012-03-31 LAB — COMPREHENSIVE METABOLIC PANEL
ALT: 16 U/L (ref 0–35)
AST: 23 U/L (ref 0–37)
CO2: 26 mEq/L (ref 19–32)
Calcium: 9.7 mg/dL (ref 8.4–10.5)
Chloride: 106 mEq/L (ref 96–112)
Creatinine, Ser: 0.99 mg/dL (ref 0.50–1.10)
Potassium: 4.3 mEq/L (ref 3.5–5.3)
Sodium: 140 mEq/L (ref 135–145)
Total Protein: 6.1 g/dL (ref 6.0–8.3)

## 2012-03-31 LAB — CBC WITH DIFFERENTIAL/PLATELET
BASO%: 0.3 % (ref 0.0–2.0)
EOS%: 1.2 % (ref 0.0–7.0)
HCT: 39.6 % (ref 34.8–46.6)
LYMPH%: 36.9 % (ref 14.0–49.7)
MCH: 26 pg (ref 25.1–34.0)
MCHC: 32.8 g/dL (ref 31.5–36.0)
NEUT%: 51.1 % (ref 38.4–76.8)
RBC: 5 10*6/uL (ref 3.70–5.45)
WBC: 6.6 10*3/uL (ref 3.9–10.3)
lymph#: 2.4 10*3/uL (ref 0.9–3.3)
nRBC: 0 % (ref 0–0)

## 2012-03-31 NOTE — Telephone Encounter (Signed)
gve the pt her July 2014 appt calendar °

## 2012-03-31 NOTE — Progress Notes (Signed)
OFFICE PROGRESS NOTE  CC  Katie Mannan, MD [address]  DIAGNOSIS: 78 year old female with history of stage II invasive breast cancer of the left breast status post mastectomy in 2003.  PRIOR THERAPY:  #1 patient was originally diagnosed with stage II invasive ductal carcinoma of the left breast she underwent a mastectomy in 2003.  #2 after her mastectomy she received 6 cycles of adjuvant chemotherapy consisting of CMF.  #3 she was then treated with tamoxifen 20 mg daily for 2 years and then switch to letrozole for a total of 5 years of therapy. She completed all of her active treatment in January 2009.  CURRENT THERAPY:Observation  INTERVAL HISTORY: Katie Silva 78 y.o. female returns for Followup visit today she is accompanied by her daughter. She was last seen by me about a year ago. Clinically she seems to be doing quite well. She does have arthritic type of pains. She otherwise denies any headaches double vision blurring of vision no fevers chills night sweats she does have aches and pains and myalgias and arthralgias she has no bleeding problems she has not noticed any masses in her right breast. Remainder of the 10 point review of systems is unremarkable.  MEDICAL HISTORY: Past Medical History  Diagnosis Date  . Breast cancer, left breast 2003    s/p mastectomy, chemo.  Dr. Park Breed oncologist  . Fracture of humerus, proximal, right, closed 07/2010    s/p hemiarthroplasty  . Osteoporosis   . Cancer     ALLERGIES:  is allergic to fexofenadine hcl and penicillins.  MEDICATIONS:  Current Outpatient Prescriptions  Medication Sig Dispense Refill  . alendronate (FOSAMAX) 70 MG tablet TAKE 1 TABLET BY MOUTH ONCE WEEKLY  4 tablet  5  . aspirin 325 MG tablet Take 325 mg by mouth daily.        . Calcium Carbonate-Vitamin D (CALTRATE 600+D) 600-400 MG-UNIT per tablet Take 1 tablet by mouth daily.        [name] glucosamine-chondroitin 500-400 MG tablet Take 1 tablet by mouth  1 day or 1 dose.      [name] L-Lysine 500 MG CAPS Take 1 capsule by mouth daily.        . vitamin B-12 (CYANOCOBALAMIN) 100 MCG tablet Take 50 mcg by mouth daily.        . vitamin C (ASCORBIC ACID) 500 MG tablet Take 500 mg by mouth daily.          SURGICAL HISTORY:  Past Surgical History  Procedure Date  . Shoulder hemi-arthroplasty 07/2010    Dr. Dion Saucier  . Abdominal hysterectomy   . Knee arthroscopy     [facility]    REVIEW OF SYSTEMS:  Pertinent items are noted in HPI.   PHYSICAL EXAMINATION:  Gen.: Patient is awake alert in no acute distress elderly female HEENT exam the: EOMI PERRLA sclerae anicteric no conjunctival pallor oral mucosa is moist neck is supple no palpable adenopathy. Lungs: Clear to auscultation and percussion Cardiovascular: Regular rate rhythm Abdomen: Soft nontender nondistended bowel sounds are present no palpable masses or hepatosplenomegaly Extremities: No edema Neuro: Alert oriented otherwise nonfocal Breast exam: Right breast in no masses nipple discharge or skin changes, left mastectomy scar well healed no evidence of local recurrence  ECOG PERFORMANCE STATUS: 1 - Symptomatic but completely ambulatory  Blood pressure 192/82, pulse 58, temperature 97.9 F (36.6 C), temperature source Oral, height 5\' 3"  (1.6 m), weight 142 lb 1.6 oz (64.456 kg).  LABORATORY  DATA: Lab Results  Component Value Date   WBC 6.6 03/31/2012   HGB 13.0 03/31/2012   HCT 39.6 03/31/2012   MCV 79.2* 03/31/2012   PLT 221 03/31/2012      Chemistry      Component Value Date/Time   NA 139 03/30/2011 1013   NA 139 03/30/2011 1013   NA 136 01/30/2010 1126   K 4.2 03/30/2011 1013   K 4.2 03/30/2011 1013   K 4.5 01/30/2010 1126   CL 105 03/30/2011 1013   CL 105 03/30/2011 1013   CL 98 01/30/2010 1126   CO2 25 03/30/2011 1013   CO2 25 03/30/2011 1013   CO2 28 01/30/2010 1126   BUN 19 03/30/2011 1013   BUN 19 03/30/2011 1013   BUN 20 01/30/2010 1126   CREATININE 0.99 03/30/2011  1013   CREATININE 0.99 03/30/2011 1013   CREATININE 1.05 01/30/2011 1605      Component Value Date/Time   CALCIUM 9.3 03/30/2011 1013   CALCIUM 9.3 03/30/2011 1013   CALCIUM 9.4 01/30/2010 1126   ALKPHOS 34* 03/30/2011 1013   ALKPHOS 34* 03/30/2011 1013   ALKPHOS 42 01/30/2010 1126   AST 19 03/30/2011 1013   AST 19 03/30/2011 1013   AST 25 01/30/2010 1126   ALT 15 03/30/2011 1013   ALT 15 03/30/2011 1013   BILITOT 0.5 03/30/2011 1013   BILITOT 0.5 03/30/2011 1013   BILITOT 0.60 01/30/2010 1126       RADIOGRAPHIC STUDIES:  No results found.  ASSESSMENT: 78 year old female with history of stage II ER positive PR positive HER-2/neu negative breast cancer of the left breast. She initially underwent a mastectomy in 2003 followed by adjuvant chemotherapy consisting of 6 cycles of CMF followed by antiestrogen therapy. She completed all of her treatments January 2009 she is now on observation only she has no evidence of recurrent disease.Patient continues to take Fosamax for osteoporosis/osteopenia   PLAN: I will continue to see the patient on a yearly basis. She certainly could be followed by Dr. Clifton Custard as well but patient would like to at least continue seeing me on a yearly timeframe.   All questions were answered. The patient knows to call the clinic with any problems, questions or concerns. We can certainly see the patient much sooner if necessary.  I spent 15 minutes counseling the patient face to face. The total time spent in the appointment was 30 minutes.    Drue Second, MD Medical/Oncology [facility] [phone] (beeper) [phone] (Office)  03/31/2012, 10:26 AM

## 2012-03-31 NOTE — Patient Instructions (Addendum)
1. You are doing great!  2. I will see you back in 1 year

## 2012-04-13 ENCOUNTER — Encounter (HOSPITAL_COMMUNITY)
Admission: EM | Disposition: A | Discharge status: Skilled Nursing Facility | Payer: Self-pay | Source: Home / Self Care | Attending: Family Medicine

## 2012-04-13 ENCOUNTER — Emergency Department (HOSPITAL_COMMUNITY): Payer: Medicare PPO

## 2012-04-13 ENCOUNTER — Inpatient Hospital Stay (HOSPITAL_COMMUNITY): Payer: Medicare PPO

## 2012-04-13 ENCOUNTER — Encounter (HOSPITAL_COMMUNITY): Payer: Self-pay | Admitting: *Deleted

## 2012-04-13 ENCOUNTER — Encounter (HOSPITAL_COMMUNITY): Payer: Self-pay | Admitting: Emergency Medicine

## 2012-04-13 ENCOUNTER — Inpatient Hospital Stay (HOSPITAL_COMMUNITY)
Admission: EM | Admit: 2012-04-13 | Discharge: 2012-04-18 | DRG: 481 | Disposition: A | Discharge status: Skilled Nursing Facility | Payer: Medicare PPO | Attending: Family Medicine | Admitting: Family Medicine

## 2012-04-13 ENCOUNTER — Inpatient Hospital Stay (HOSPITAL_COMMUNITY): Payer: Medicare PPO | Admitting: *Deleted

## 2012-04-13 DIAGNOSIS — S72002A Fracture of unspecified part of neck of left femur, initial encounter for closed fracture: Secondary | ICD-10-CM | POA: Diagnosis present

## 2012-04-13 DIAGNOSIS — Z9221 Personal history of antineoplastic chemotherapy: Secondary | ICD-10-CM

## 2012-04-13 DIAGNOSIS — R52 Pain, unspecified: Secondary | ICD-10-CM | POA: Insufficient documentation

## 2012-04-13 DIAGNOSIS — I10 Essential (primary) hypertension: Secondary | ICD-10-CM

## 2012-04-13 DIAGNOSIS — I498 Other specified cardiac arrhythmias: Secondary | ICD-10-CM | POA: Diagnosis present

## 2012-04-13 DIAGNOSIS — D649 Anemia, unspecified: Secondary | ICD-10-CM

## 2012-04-13 DIAGNOSIS — S7292XA Unspecified fracture of left femur, initial encounter for closed fracture: Secondary | ICD-10-CM

## 2012-04-13 DIAGNOSIS — M810 Age-related osteoporosis without current pathological fracture: Secondary | ICD-10-CM

## 2012-04-13 DIAGNOSIS — I471 Supraventricular tachycardia, unspecified: Secondary | ICD-10-CM

## 2012-04-13 DIAGNOSIS — S72143A Displaced intertrochanteric fracture of unspecified femur, initial encounter for closed fracture: Principal | ICD-10-CM | POA: Diagnosis present

## 2012-04-13 DIAGNOSIS — Z96619 Presence of unspecified artificial shoulder joint: Secondary | ICD-10-CM

## 2012-04-13 DIAGNOSIS — W010XXA Fall on same level from slipping, tripping and stumbling without subsequent striking against object, initial encounter: Secondary | ICD-10-CM | POA: Diagnosis present

## 2012-04-13 DIAGNOSIS — Z9010 Acquired absence of unspecified breast and nipple: Secondary | ICD-10-CM

## 2012-04-13 DIAGNOSIS — Z79899 Other long term (current) drug therapy: Secondary | ICD-10-CM

## 2012-04-13 DIAGNOSIS — S72009A Fracture of unspecified part of neck of unspecified femur, initial encounter for closed fracture: Secondary | ICD-10-CM | POA: Diagnosis present

## 2012-04-13 DIAGNOSIS — Z853 Personal history of malignant neoplasm of breast: Secondary | ICD-10-CM

## 2012-04-13 DIAGNOSIS — W19XXXA Unspecified fall, initial encounter: Secondary | ICD-10-CM | POA: Diagnosis present

## 2012-04-13 DIAGNOSIS — Z7982 Long term (current) use of aspirin: Secondary | ICD-10-CM

## 2012-04-13 DIAGNOSIS — D62 Acute posthemorrhagic anemia: Secondary | ICD-10-CM | POA: Diagnosis not present

## 2012-04-13 HISTORY — PX: FEMUR IM NAIL: SHX1597

## 2012-04-13 LAB — CBC WITH DIFFERENTIAL/PLATELET
Eosinophils Absolute: 0.1 10*3/uL (ref 0.0–0.7)
Eosinophils Relative: 1 % (ref 0–5)
HCT: 38.4 % (ref 36.0–46.0)
Hemoglobin: 12.5 g/dL (ref 12.0–15.0)
Lymphocytes Relative: 35 % (ref 12–46)
Lymphs Abs: 2.9 10*3/uL (ref 0.7–4.0)
MCH: 26.3 pg (ref 26.0–34.0)
MCV: 80.7 fL (ref 78.0–100.0)
Monocytes Absolute: 0.9 10*3/uL (ref 0.1–1.0)
Monocytes Relative: 11 % (ref 3–12)
RBC: 4.76 MIL/uL (ref 3.87–5.11)
WBC: 8.3 10*3/uL (ref 4.0–10.5)

## 2012-04-13 LAB — BASIC METABOLIC PANEL
CO2: 25 mEq/L (ref 19–32)
Calcium: 9.1 mg/dL (ref 8.4–10.5)
Chloride: 105 mEq/L (ref 96–112)
Glucose, Bld: 96 mg/dL (ref 70–99)
Potassium: 4 mEq/L (ref 3.5–5.1)
Sodium: 141 mEq/L (ref 135–145)

## 2012-04-13 LAB — PROTIME-INR: INR: 1.04 (ref 0.00–1.49)

## 2012-04-13 SURGERY — INSERTION, INTRAMEDULLARY ROD, FEMUR
Anesthesia: General | Site: Hip | Laterality: Left | Wound class: Clean

## 2012-04-13 MED ORDER — ETOMIDATE 2 MG/ML IV SOLN
INTRAVENOUS | Status: DC | PRN
  Administered 2012-04-13: 14 mg via INTRAVENOUS

## 2012-04-13 MED ORDER — SUCCINYLCHOLINE CHLORIDE 20 MG/ML IJ SOLN
INTRAMUSCULAR | Status: DC | PRN
  Administered 2012-04-13: 100 mg via INTRAVENOUS

## 2012-04-13 MED ORDER — LACTATED RINGERS IV SOLN
INTRAVENOUS | Status: DC | PRN
  Administered 2012-04-13 (×2): via INTRAVENOUS

## 2012-04-13 MED ORDER — HYDROCODONE-ACETAMINOPHEN 5-325 MG PO TABS: 1.0000 | ORAL_TABLET | Freq: Four times a day (QID) | ORAL | Status: DC | PRN

## 2012-04-13 MED ORDER — MORPHINE SULFATE 2 MG/ML IJ SOLN: 0.5000 mg | INTRAMUSCULAR | Status: DC | PRN

## 2012-04-13 MED ORDER — ACETAMINOPHEN 10 MG/ML IV SOLN
INTRAVENOUS | Status: AC
  Filled 2012-04-13: qty 100

## 2012-04-13 MED ORDER — LIDOCAINE HCL (CARDIAC) 20 MG/ML IV SOLN
INTRAVENOUS | Status: DC | PRN
  Administered 2012-04-13: 50 mg via INTRAVENOUS

## 2012-04-13 MED ORDER — PHENYLEPHRINE HCL 10 MG/ML IJ SOLN
INTRAMUSCULAR | Status: DC | PRN
  Administered 2012-04-13 (×5): 20 ug via INTRAVENOUS

## 2012-04-13 MED ORDER — VANCOMYCIN HCL IN DEXTROSE 1-5 GM/200ML-% IV SOLN
INTRAVENOUS | Status: AC
  Filled 2012-04-13: qty 200

## 2012-04-13 MED ORDER — VANCOMYCIN HCL 1000 MG IV SOLR
1000.0000 mg | INTRAVENOUS | Status: DC | PRN
  Administered 2012-04-13: 1000 mg via INTRAVENOUS

## 2012-04-13 MED ORDER — FENTANYL CITRATE 0.05 MG/ML IJ SOLN
INTRAMUSCULAR | Status: DC | PRN
  Administered 2012-04-13 (×2): 100 ug via INTRAVENOUS
  Administered 2012-04-13: 50 ug via INTRAVENOUS

## 2012-04-13 MED ORDER — ACETAMINOPHEN 10 MG/ML IV SOLN
INTRAVENOUS | Status: DC | PRN
  Administered 2012-04-13: 1000 mg via INTRAVENOUS

## 2012-04-13 MED ORDER — ONDANSETRON HCL 4 MG/2ML IJ SOLN
4.0000 mg | Freq: Once | INTRAMUSCULAR | Status: AC
  Administered 2012-04-13: 4 mg via INTRAVENOUS
  Filled 2012-04-13: qty 2

## 2012-04-13 MED ORDER — BUPIVACAINE HCL (PF) 0.25 % IJ SOLN
INTRAMUSCULAR | Status: AC
  Filled 2012-04-13: qty 30

## 2012-04-13 MED ORDER — EPHEDRINE SULFATE 50 MG/ML IJ SOLN
INTRAMUSCULAR | Status: DC | PRN
  Administered 2012-04-13: 2.5 mg via INTRAVENOUS

## 2012-04-13 MED ORDER — 0.9 % SODIUM CHLORIDE (POUR BTL) OPTIME
TOPICAL | Status: DC | PRN
  Administered 2012-04-13: 1000 mL

## 2012-04-13 MED ORDER — FENTANYL CITRATE 0.05 MG/ML IJ SOLN
50.0000 ug | INTRAMUSCULAR | Status: AC | PRN
  Administered 2012-04-13 (×2): 50 ug via NASAL
  Filled 2012-04-13 (×2): qty 2

## 2012-04-13 MED ORDER — DEXAMETHASONE SODIUM PHOSPHATE 10 MG/ML IJ SOLN
INTRAMUSCULAR | Status: DC | PRN
  Administered 2012-04-13: 10 mg via INTRAVENOUS

## 2012-04-13 MED ORDER — ONDANSETRON HCL 4 MG/2ML IJ SOLN
INTRAMUSCULAR | Status: DC | PRN
  Administered 2012-04-13 (×2): 2 mg via INTRAVENOUS

## 2012-04-13 MED ORDER — MORPHINE SULFATE 4 MG/ML IJ SOLN
4.0000 mg | Freq: Once | INTRAMUSCULAR | Status: AC
  Administered 2012-04-13: 4 mg via INTRAVENOUS
  Filled 2012-04-13: qty 1

## 2012-04-13 MED ORDER — BUPIVACAINE HCL (PF) 0.25 % IJ SOLN
INTRAMUSCULAR | Status: DC | PRN
  Administered 2012-04-13: 10 mL

## 2012-04-13 SURGICAL SUPPLY — 59 items
BAG SPEC THK2 15X12 ZIP CLS (MISCELLANEOUS)
BAG ZIPLOCK 12X15 (MISCELLANEOUS) ×1 IMPLANT
BIT DRILL 4.3MMS DISTAL GRDTED (BIT) IMPLANT
BIT DRILL CANN LG 4.3MM (BIT) IMPLANT
BNDG COHESIVE 4X5 TAN STRL (GAUZE/BANDAGES/DRESSINGS) ×2 IMPLANT
CLOTH BEACON ORANGE TIMEOUT ST (SAFETY) ×2 IMPLANT
COVER MAYO STAND STRL (DRAPES) ×2 IMPLANT
COVER SURGICAL LIGHT HANDLE (MISCELLANEOUS) ×2 IMPLANT
DRAPE BACK TABLE (DRAPES) ×1 IMPLANT
DRAPE C-ARM 42X72 X-RAY (DRAPES) ×1 IMPLANT
DRAPE INCISE IOBAN 66X45 STRL (DRAPES) ×1 IMPLANT
DRAPE STERI IOBAN 125X83 (DRAPES) ×2 IMPLANT
DRILL 4.3MMS DISTAL GRADUATED (BIT) ×2
DRILL BIT CANN LG 4.3MM (BIT) ×2
DRSG ADAPTIC 3X8 NADH LF (GAUZE/BANDAGES/DRESSINGS) ×1 IMPLANT
DRSG MEPILEX BORDER 4X4 (GAUZE/BANDAGES/DRESSINGS) ×1 IMPLANT
DRSG MEPILEX BORDER 4X8 (GAUZE/BANDAGES/DRESSINGS) IMPLANT
DRSG PAD ABDOMINAL 8X10 ST (GAUZE/BANDAGES/DRESSINGS) ×6 IMPLANT
DURAPREP 26ML APPLICATOR (WOUND CARE) ×2 IMPLANT
ELECT REM PT RETURN 9FT ADLT (ELECTROSURGICAL) ×2
ELECTRODE REM PT RTRN 9FT ADLT (ELECTROSURGICAL) ×1 IMPLANT
GAUZE SPONGE 2X2 8PLY STRL LF (GAUZE/BANDAGES/DRESSINGS) IMPLANT
GLOVE BIOGEL PI IND STRL 6.5 (GLOVE) IMPLANT
GLOVE BIOGEL PI IND STRL 8.5 (GLOVE) IMPLANT
GLOVE BIOGEL PI INDICATOR 6.5 (GLOVE) ×1
GLOVE BIOGEL PI INDICATOR 8.5 (GLOVE) ×1
GLOVE ECLIPSE 7.0 STRL STRAW (GLOVE) ×1 IMPLANT
GLOVE ECLIPSE 8.5 STRL (GLOVE) ×2 IMPLANT
GLOVE SURG ORTHO 8.5 STRL (GLOVE) ×1 IMPLANT
GLOVE SURG SS PI 6.0 STRL IVOR (GLOVE) ×1 IMPLANT
GLOVE SURG SS PI 8.5 STRL IVOR (GLOVE) ×2
GLOVE SURG SS PI 8.5 STRL STRW (GLOVE) IMPLANT
GOWN STRL NON-REIN LRG LVL3 (GOWN DISPOSABLE) ×1 IMPLANT
GOWN STRL REIN XL XLG (GOWN DISPOSABLE) ×3 IMPLANT
GUIDE PIN 3.2MM (PIN) ×1 IMPLANT
GUIDEPIN 3.2X17.5 THRD DISP (PIN) ×2 IMPLANT
GUIDEWIRE BALL NOSE 80CM (WIRE) ×1 IMPLANT
HIP FRAC NAIL LEFT 11X360MM (Orthopedic Implant) ×2 IMPLANT
KIT BASIN OR (CUSTOM PROCEDURE TRAY) ×2 IMPLANT
MANIFOLD NEPTUNE II (INSTRUMENTS) ×2 IMPLANT
NAIL HIP FRAC LEFT 11X360MM (Orthopedic Implant) IMPLANT
NEEDLE HYPO 22GX1.5 SAFETY (NEEDLE) ×1 IMPLANT
PACK GENERAL/GYN (CUSTOM PROCEDURE TRAY) ×2 IMPLANT
PAD CAST 4YDX4 CTTN HI CHSV (CAST SUPPLIES) ×1 IMPLANT
PADDING CAST COTTON 4X4 STRL (CAST SUPPLIES) ×2
PADDING CAST SYN 6 (CAST SUPPLIES) ×1
PADDING CAST SYNTHETIC 6X4 NS (CAST SUPPLIES) ×1 IMPLANT
POSITIONER SURGICAL ARM (MISCELLANEOUS) ×4 IMPLANT
SCREW BONE CORTICAL 5.0X40 (Screw) ×1 IMPLANT
SCREW LAG 10.5MMX105MM HFN (Screw) ×1 IMPLANT
SPONGE GAUZE 2X2 STER 10/PKG (GAUZE/BANDAGES/DRESSINGS) ×2
SPONGE GAUZE 4X4 12PLY (GAUZE/BANDAGES/DRESSINGS) ×1 IMPLANT
STAPLER VISISTAT 35W (STAPLE) ×2 IMPLANT
SUT VIC AB 1 CT1 36 (SUTURE) ×2 IMPLANT
SUT VIC AB 2-0 CT1 18 (SUTURE) ×1 IMPLANT
SUT VIC AB 2-0 CT1 27 (SUTURE) ×4
SUT VIC AB 2-0 CT1 TAPERPNT 27 (SUTURE) IMPLANT
SYR CONTROL 10ML LL (SYRINGE) ×1 IMPLANT
TOWEL OR 17X26 10 PK STRL BLUE (TOWEL DISPOSABLE) ×6 IMPLANT

## 2012-04-13 NOTE — Consult Note (Signed)
Katie Mannan, MD Chief Complaint: S/p fall with left hip pain History: 78 yr old otherwise healthy woman who fell earlier today at church.  Complains on significant hip pain and unable to ambulate.  Brought to ER for further evaluation.  Xrays demonstrate left IT hip fx - ortho consult requested.  Past Medical History  Diagnosis Date  . Breast cancer, left breast 2003    s/p mastectomy, chemo.  Katie Silva oncologist  . Fracture of humerus, proximal, right, closed 07/2010    s/p hemiarthroplasty  . Osteoporosis   . Cancer     Allergies  Allergen Reactions  . Fexofenadine Hcl Other (See Comments)    Doesn't agree with patient  . Penicillins     No current facility-administered medications on file prior to encounter.   Current Outpatient Prescriptions on File Prior to Encounter  Medication Sig Dispense Refill  . Calcium Carbonate-Vitamin D (CALTRATE 600+D) 600-400 MG-UNIT per tablet Take 1 tablet by mouth 2 (two) times daily.       [name] glucosamine-chondroitin 500-400 MG tablet Take 2 tablets by mouth daily.       [name] L-Lysine 500 MG CAPS Take 1 capsule by mouth daily.        . vitamin B-12 (CYANOCOBALAMIN) 100 MCG tablet Take 50 mcg by mouth daily.        . vitamin C (ASCORBIC ACID) 500 MG tablet Take 500 mg by mouth daily.          Physical Exam: Filed Vitals:   04/13/12 1923  BP: 147/74  Pulse: 54  Temp: 98.8 F (37.1 C)  Resp: 13  A+O X3 No sob/cp abd soft/NT NVI - 1+ DP/PT pulses.  EHL/GA/TA intact - 5/5   Sensation to LT intact Compartments soft/NT Positive left hip pain.  No abrasion/laceration noted   Image: Dg Shoulder Right  04/13/2012  *RADIOLOGY REPORT*  Clinical Data: Fall, right shoulder pain  RIGHT SHOULDER - 2+ VIEW  Comparison: 07/26/2010  Findings: Four views of the right shoulder submitted. No acute fracture is noted.  Again noted right humeral prosthesis.  There is high-riding right humerus suspicious for superior subluxation from the glenohumeral joint.  Rotator cuff injury cannot be excluded. Stable probable post surgical resection of distal right clavicle.  IMPRESSION: No acute fracture is noted.  Again noted right humeral prosthesis. There is high-riding right humerus suspicious for superior subluxation from the glenohumeral joint. Rotator cuff injury cannot be excluded.  Stable probable post surgical resection of distal right clavicle.  Original Report Authenticated By: Katie Silva, M.D.   Dg Hip Complete Left  04/13/2012  *RADIOLOGY REPORT*  Clinical Data: Fall, hip pain  LEFT HIP - COMPLETE 2+ VIEW  Comparison: None.  Findings: Four views of the left hip submitted.  There is displaced intertrochanteric fracture of the proximal left femur.  Diffuse osteopenia is noted.  IMPRESSION: Displaced fracture of proximal left femur.  Diffuse osteopenia.  Per CMS PQRS reporting requirements (PQRS Measure 24): Given the patient's age of greater than 50 and the fracture site (hip, distal radius, or spine), the patient should be tested for osteoporosis using DXA, and the appropriate treatment considered based on the DXA results.  Original Report Authenticated By: Katie Silva, M.D.   Ct Head Wo Contrast  04/13/2012  *RADIOLOGY REPORT*  Clinical Data: Hypertension, fall  CT HEAD WITHOUT CONTRAST  Technique:  Contiguous axial images were obtained from the base of the skull through the vertex without contrast.  Comparison: 10/08/2009  Findings: Age-related brain  atrophy and white matter microvascular ischemic changes.  No acute intracranial hemorrhage, definite fraction, mass lesion, midline shift, herniation, hydrocephalus, or extra-axial fluid collection.  Cisterns patent.  No cerebellar abnormality.  Mastoid sinuses clear.  Calvarial thickening noted.  IMPRESSION: Stable exam.  No acute intracranial finding  Original Report Authenticated By: Katie Silva, M.D.    A/P: 78 year old s/p fall at church. Denies LOC, HA, nausea.  Complains primarily of left hip/groin pain.   Unable to ambulate.  Xrays demonstrate inter-troch hip fracture. Plan:  Medical evaluation completed - cleared for surgery  Admit to medicine per hip fracture protocol  Surgery tonight - IM nail fixation  Reviewed risks/benifits with patient and her 2 daughters  All questions addressed

## 2012-04-13 NOTE — Transfer of Care (Signed)
Immediate Anesthesia Transfer of Care Note  Patient: Katie Silva  Procedure(s) Performed: Procedure(s) (LRB): INTRAMEDULLARY (IM) NAIL FEMORAL (Left)  Patient Location: PACU  Anesthesia Type: General  Level of Consciousness: awake, oriented, patient cooperative, lethargic and responds to stimulation  Airway & Oxygen Therapy: Patient Spontanous Breathing and Patient connected to face mask oxygen  Post-op Assessment: Report given to PACU RN, Post -op Vital signs reviewed and stable and Patient moving all extremities  Post vital signs: Reviewed and stable  Complications: cardiovascular complications

## 2012-04-13 NOTE — Anesthesia Procedure Notes (Signed)
Procedure Name: Intubation Date/Time: 04/13/2012 9:36 PM Performed by: Edison Pace Pre-anesthesia Checklist: Patient identified, Timeout performed, Emergency Drugs available, Suction available and Patient being monitored Patient Re-evaluated:Patient Re-evaluated prior to inductionOxygen Delivery Method: Circle system utilized Preoxygenation: Pre-oxygenation with 100% oxygen Intubation Type: IV induction and Rapid sequence Laryngoscope Size: Mac and 3 Grade View: Grade I Tube type: Oral Tube size: 7.5 mm Number of attempts: 1 Airway Equipment and Method: Stylet Secured at: 21 cm Tube secured with: Tape Dental Injury: Teeth and Oropharynx as per pre-operative assessment

## 2012-04-13 NOTE — Anesthesia Postprocedure Evaluation (Signed)
  Anesthesia Post-op Note  Patient: Katie Silva  Procedure(s) Performed: Procedure(s) (LRB): INTRAMEDULLARY (IM) NAIL FEMORAL (Left)  Patient Location: PACU  Anesthesia Type: General  Level of Consciousness: oriented and sedated  Airway and Oxygen Therapy: Patient Spontanous Breathing and Patient connected to nasal cannula oxygen  Post-op Pain: mild  Post-op Assessment: Post-op Vital signs reviewed, Patient's Cardiovascular Status Stable, Respiratory Function Stable and Patent Airway  Post-op Vital Signs: stable  Complications: No apparent anesthesia complications, step-down unit for monitoring

## 2012-04-13 NOTE — H&P (Signed)
[name] History and Physical  GENAVIVE KUBICKI ZOX:[id number] DOB: 1926/07/14 DOA: 04/13/2012  Referring physician: Derwood Kaplan PCP: [name], MD   Chief Complaint: Left hip fracture  HPI:  Katie Silva is 78 year old Caucasian female with past medical history of left sided breast cancer, patient has osteoporosis and history of right shoulder hemiarthroplasty secondary to proximal humeral fracture. Patient brought to the hospital after she fell and broke her left hip. Patient reported that she was walking out of her church, she tripped on the sidewalk and she fell, she was not able to stand after that and she felt a lot of pain. Patient brought to the hospital, in the emergency department x-rays showed left hip fracture. Patient has CT scan of the head showed no acute findings.  Review of Systems:  Constitutional: negative for anorexia, fevers and sweats Eyes: negative for irritation, redness and visual disturbance Ears, nose, mouth, throat, and face: negative for earaches, epistaxis, nasal congestion and sore throat Respiratory: negative for cough, dyspnea on exertion, sputum and wheezing Cardiovascular: negative for chest pain, dyspnea, lower extremity edema, orthopnea, palpitations and syncope Gastrointestinal: negative for abdominal pain, constipation, diarrhea, melena, nausea and vomiting Genitourinary:negative for dysuria, frequency and hematuria Hematologic/lymphatic: negative for bleeding, easy bruising and lymphadenopathy Musculoskeletal:negative for arthralgias, muscle weakness and stiff joints Neurological: negative for coordination problems, gait problems, headaches and weakness Endocrine: negative for diabetic symptoms including polydipsia, polyuria and weight loss Allergic/Immunologic: negative for anaphylaxis, hay fever and urticaria  Past Medical History  Diagnosis Date  . Breast cancer, left breast 2003    s/p mastectomy, chemo.  Dr. Park Breed oncologist    . Fracture of humerus, proximal, right, closed 07/2010    s/p hemiarthroplasty  . Osteoporosis   . Cancer    Past Surgical History  Procedure Date  . Shoulder hemi-arthroplasty 07/2010    Dr. Dion Saucier  . Abdominal hysterectomy   . Knee arthroscopy     [facility] knees [facility]   Social History:  reports that she has never smoked. She has never used smokeless tobacco. She reports that she does not drink alcohol or use illicit drugs. Patient lives at home, her daughter reported that she fell 3 times this summer.   Allergies  Allergen Reactions  . Fexofenadine Hcl Other (See Comments)    Doesn't agree with patient  . Penicillins     Family History  Problem Relation Age of Onset  . Myasthenia gravis Mother   . Coronary artery disease Father     Prior to Admission medications   Medication Sig Start Date End Date Taking? Authorizing Provider  alendronate (FOSAMAX) 70 MG tablet Take 70 mg by mouth every 7 (seven) days. Take with a full glass of water on an empty stomach every Friday.   Yes Historical Provider, MD  aspirin 81 MG tablet Take 81 mg by mouth daily.   Yes Historical Provider, MD  Calcium Carbonate-Vitamin D (CALTRATE 600+D) 600-400 MG-UNIT per tablet Take 1 tablet by mouth 2 (two) times daily.    Yes Historical Provider, MD  glucosamine-chondroitin 500-400 MG tablet Take 2 tablets by mouth daily.  10/05/98  Yes Historical Provider, MD  L-Lysine 500 MG CAPS Take 1 capsule by mouth daily.     Yes Historical Provider, MD  Misc Natural Products (APPLE CIDER VINEGAR DIET PO) Take 4 oz by mouth daily as needed. As needed for pain. Mix 16 oz grape juice,  4 oz apple cider vinegar, 16 oz apple juice, and honey to taste.  Drink 4 ounces daily.   Yes Historical Provider, MD  vitamin B-12 (CYANOCOBALAMIN) 100 MCG tablet Take 50 mcg by mouth daily.     Yes Historical Provider, MD  vitamin C (ASCORBIC ACID) 500 MG tablet Take 500 mg by mouth daily.     Yes Historical Provider, MD  vitamin  E 400 UNIT capsule Take 400 Units by mouth daily.   Yes Historical Provider, MD   Physical Exam: Filed Vitals:   04/13/12 1658 04/13/12 1923  BP: 190/88 147/74  Pulse: 80 54  Temp: 97.7 F (36.5 C) 98.8 F (37.1 C)  TempSrc: Oral Oral  Resp: 20 13  SpO2: 100% 95%   General appearance: alert, cooperative and no distress  Head: Normocephalic, without obvious abnormality, atraumatic  Eyes: conjunctivae/corneas clear. PERRL, EOM's intact. Fundi benign.  Nose: Nares normal. Septum midline. Mucosa normal. No drainage or sinus tenderness.  Throat: lips, mucosa, and tongue normal; teeth and gums normal  Neck: Supple, no masses, no cervical lymphadenopathy, no JVD appreciated, no meningeal signs Resp: clear to auscultation bilaterally  Chest wall: no tenderness  Cardio: regular rate and rhythm, S1, S2 normal, no murmur, click, rub or gallop  GI: soft, non-tender; bowel sounds normal; no masses, no organomegaly  Extremities: extremities normal, atraumatic, no cyanosis or edema  Skin: Skin color, texture, turgor normal. No rashes or lesions  Neurologic: Alert and oriented X 3, normal strength and tone. Normal symmetric reflexes. Normal coordination and gait  Labs on Admission:  Basic Metabolic Panel:  Lab 04/13/12 1610  NA 141  K 4.0  CL 105  CO2 25  GLUCOSE 96  BUN 19  CREATININE 1.03  CALCIUM 9.1  MG --  PHOS --   Liver Function Tests: No results found for this basename: AST:5,ALT:5,ALKPHOS:5,BILITOT:5,PROT:5,ALBUMIN:5 in the last 168 hours No results found for this basename: LIPASE:5,AMYLASE:5 in the last 168 hours No results found for this basename: AMMONIA:5 in the last 168 hours CBC:  Lab 04/13/12 1902  WBC 8.3  NEUTROABS 4.4  HGB 12.5  HCT 38.4  MCV 80.7  PLT 260   Cardiac Enzymes: No results found for this basename: CKTOTAL:5,CKMB:5,CKMBINDEX:5,TROPONINI:5 in the last 168 hours  BNP (last 3 results) No results found for this basename: PROBNP:3 in the last  8760 hours CBG: No results found for this basename: GLUCAP:5 in the last 168 hours  Radiological Exams on Admission: Dg Shoulder Right  04/13/2012  *RADIOLOGY REPORT*  Clinical Data: Fall, right shoulder pain  RIGHT SHOULDER - 2+ VIEW  Comparison: 07/26/2010  Findings: Four views of the right shoulder submitted. No acute fracture is noted.  Again noted right humeral prosthesis.  There is high-riding right humerus suspicious for superior subluxation from the glenohumeral joint. Rotator cuff injury cannot be excluded. Stable probable post surgical resection of distal right clavicle.  IMPRESSION: No acute fracture is noted.  Again noted right humeral prosthesis. There is high-riding right humerus suspicious for superior subluxation from the glenohumeral joint. Rotator cuff injury cannot be excluded.  Stable probable post surgical resection of distal right clavicle.  Original Report Authenticated By: Natasha Mead, M.D.   Dg Hip Complete Left  04/13/2012  *RADIOLOGY REPORT*  Clinical Data: Fall, hip pain  LEFT HIP - COMPLETE 2+ VIEW  Comparison: None.  Findings: Four views of the left hip submitted.  There is displaced intertrochanteric fracture of the proximal left femur.  Diffuse osteopenia is noted.  IMPRESSION: Displaced fracture of proximal left femur.  Diffuse osteopenia.  Per CMS PQRS reporting  requirements (PQRS Measure 24): Given the patient's age of greater than 50 and the fracture site (hip, distal radius, or spine), the patient should be tested for osteoporosis using DXA, and the appropriate treatment considered based on the DXA results.  Original Report Authenticated By: Natasha Mead, M.D.   Ct Head Wo Contrast  04/13/2012  *RADIOLOGY REPORT*  Clinical Data: Hypertension, fall  CT HEAD WITHOUT CONTRAST  Technique:  Contiguous axial images were obtained from the base of the skull through the vertex without contrast.  Comparison: 10/08/2009  Findings: Age-related brain atrophy and white matter microvascular  ischemic changes.  No acute intracranial hemorrhage, definite fraction, mass lesion, midline shift, herniation, hydrocephalus, or extra-axial fluid collection.  Cisterns patent.  No cerebellar abnormality.  Mastoid sinuses clear.  Calvarial thickening noted.  IMPRESSION: Stable exam.  No acute intracranial finding  Original Report Authenticated By: Judie Petit. Ruel Favors, M.D.    EKG: Independently reviewed.  Assessment/Plan Principal Problem:  *Closed left hip fracture Active Problems:  Osteoporosis  Acute pain  Fall   Left hip fracture Dr. Shon Baton with orthopedics notified, he evaluated the patient already, patient is to go for surgery tonight. Patient is on SCDs for DVT prophylaxis, orthopedics will specify what type of DVT prophylaxis after surgery. Pain control with IV narcotics. For cardiac risk stratification, patient does not have DM, CVA, CKD, CHF or arrhythmia. She can walk a block without being short of breath. She is low to moderate risk to go for surgery, I recommended to proceed with surgery without any aggressive testing.  Fall Appears to be a mechanical fall, patient said she tripped over the sidewalk, she denies any type of prodrome before the fall, denies loss of consciousness. Patient will be on telemetry to rule out any arrhythmias or ACS.  Osteoporosis Patient is taking a bisphosphonate, she had previous right proximal humeral fracture.  Remote history of his cancer Is left-sided mastectomy with adjuvant chemotherapy and hormonal therapy afterwards.  Code Status: Full Family Communication: 2 daughters at bedside, updated on the plan. Disposition Plan: To telemetry, likely she will need skilled nursing facility after the surgery.  Time spent: 60 minutes  [name] Pager [phone]  If 7PM-7AM, please contact night-coverage [email or web address] Password [email or web address] 04/13/2012, 8:40 PM

## 2012-04-13 NOTE — Brief Op Note (Signed)
04/13/2012  11:20 PM  PATIENT:  Katie Silva  78 y.o. female  PRE-OPERATIVE DIAGNOSIS:  fractured left hip  POST-OPERATIVE DIAGNOSIS:  fractured left hip  PROCEDURE:  Procedure(s) (LRB): INTRAMEDULLARY (IM) NAIL FEMORAL (Left)  SURGEON:  Surgeon(s) and Role:    * Venita Lick, MD - Primary  PHYSICIAN ASSISTANT:   ASSISTANTS: Norval Gable   ANESTHESIA:   general  EBL:  Total I/O In: 1000 [I.V.:1000] Out: 300 [Urine:250; Blood:50]  BLOOD ADMINISTERED:none  DRAINS: none   LOCAL MEDICATIONS USED:  MARCAINE     SPECIMEN:  No Specimen  DISPOSITION OF SPECIMEN:  N/A  COUNTS:  YES  TOURNIQUET:  * No tourniquets in log *  DICTATION: .Other Dictation: Dictation Number [id number]  PLAN OF CARE: Admit to inpatient   PATIENT DISPOSITION:  PACU - guarded condition.  SVT during case - admit to telemetry/ICU

## 2012-04-13 NOTE — Anesthesia Preprocedure Evaluation (Signed)
Anesthesia Evaluation  Patient identified by MRN, date of birth, ID band Patient awake  General Assessment Comment:Ate lunch 13:30 Advanced yrs  Reviewed: Allergy & Precautions, H&P , NPO status , Patient's Chart, lab work & pertinent test results, reviewed documented beta blocker date and time   Airway  TM Distance: >3 FB Neck ROM: Full    Dental  (+) Upper Dentures and Lower Dentures   Pulmonary neg pulmonary ROS,  breath sounds clear to auscultation        Cardiovascular negative cardio ROS  Rhythm:Regular Rate:Normal  Denies HTN Denies cardiac symptoms   Neuro/Psych negative neurological ROS  negative psych ROS   GI/Hepatic negative GI ROS, Neg liver ROS,   Endo/Other  negative endocrine ROS  Renal/GU negative Renal ROS  negative genitourinary   Musculoskeletal   Abdominal   Peds negative pediatric ROS (+)  Hematology negative hematology ROS (+)   Anesthesia Other Findings   Reproductive/Obstetrics negative OB ROS Hx breast cancer                           Anesthesia Physical Anesthesia Plan  ASA: III  Anesthesia Plan: General   Post-op Pain Management:    Induction: Intravenous, Rapid sequence and Cricoid pressure planned  Airway Management Planned: Oral ETT  Additional Equipment:   Intra-op Plan:   Post-operative Plan: Extubation in OR  Informed Consent: I have reviewed the patients History and Physical, chart, labs and discussed the procedure including the risks, benefits and alternatives for the proposed anesthesia with the patient or authorized representative who has indicated his/her understanding and acceptance.     Plan Discussed with: CRNA and Surgeon  Anesthesia Plan Comments:         Anesthesia Quick Evaluation

## 2012-04-13 NOTE — ED Notes (Signed)
Family at bedside. 

## 2012-04-13 NOTE — Preoperative (Signed)
Beta Blockers   Reason not to administer Beta Blockers:Not Applicable 

## 2012-04-13 NOTE — ED Provider Notes (Addendum)
History     CSN: [id number]  Arrival date & time 04/13/12  1651   First MD Initiated Contact with Patient 04/13/12 1802      Chief Complaint  Patient presents with  . Fall    (Consider location/radiation/quality/duration/timing/severity/associated sxs/prior treatment) HPI Comments: Pt comes in after a fall, on to a concrete surface.  Complains primarily of hip pain. No ambulation since the fall. No n/v/f/c. Not on blood thinner.  The history is provided by the patient.    Past Medical History  Diagnosis Date  . Breast cancer, left breast 2003    s/p mastectomy, chemo.  Dr. Park Breed oncologist  . Fracture of humerus, proximal, right, closed 07/2010    s/p hemiarthroplasty  . Osteoporosis   . Cancer     Past Surgical History  Procedure Date  . Shoulder hemi-arthroplasty 07/2010    Dr. Dion Saucier  . Abdominal hysterectomy   . Knee arthroscopy     [facility] at [facility]    Family History  Problem Relation Age of Onset  . Myasthenia gravis Mother   . Coronary artery disease Father     History  Substance Use Topics  . Smoking status: Never Smoker   . Smokeless tobacco: Never Used  . Alcohol Use: No    OB History    Grav Para Term Preterm Abortions TAB SAB Ect Mult Living                  Review of Systems  Constitutional: Positive for activity change.  HENT: Negative for neck pain.   Respiratory: Negative for shortness of breath.   Cardiovascular: Negative for chest pain.  Gastrointestinal: Negative for nausea, vomiting and abdominal pain.  Genitourinary: Negative for dysuria.  Musculoskeletal: Positive for myalgias and arthralgias.  Neurological: Negative for headaches.    Allergies  Fexofenadine hcl and Penicillins  Home Medications   Current Outpatient Rx  Name Route Sig Dispense Refill  . ALENDRONATE SODIUM 70 MG PO TABS Oral Take 70 mg by mouth every 7 (seven) days. Take with a full glass of water on an empty stomach every Friday.    . ASPIRIN 81  MG PO TABS Oral Take 81 mg by mouth daily.    [name] CALCIUM CARBONATE-VITAMIN D 600-400 MG-UNIT PO TABS Oral Take 1 tablet by mouth 2 (two) times daily.     [name] GLUCOSAMINE-CHONDROITIN 500-400 MG PO TABS Oral Take 2 tablets by mouth daily.     [name] L-LYSINE 500 MG PO CAPS Oral Take 1 capsule by mouth daily.      . APPLE CIDER VINEGAR DIET PO Oral Take 4 oz by mouth daily as needed. As needed for pain. Mix 16 oz grape juice,  4 oz apple cider vinegar, 16 oz apple juice, and honey to taste.  Drink 4 ounces daily.    [name] VITAMIN B-12 100 MCG PO TABS Oral Take 50 mcg by mouth daily.      [name] VITAMIN C 500 MG PO TABS Oral Take 500 mg by mouth daily.      [name] VITAMIN E 400 UNITS PO CAPS Oral Take 400 Units by mouth daily.      BP 147/74  Pulse 54  Temp 98.8 F (37.1 C) (Oral)  Resp 13  SpO2 95%  Physical Exam  Nursing note and vitals reviewed. Constitutional: She is oriented to person, place, and time. She appears well-developed and well-nourished.  HENT:  Head: Normocephalic and atraumatic.  Eyes: EOM are normal. Pupils are equal, round,  and reactive to light.  Neck: Neck supple.  Cardiovascular: Normal rate, regular rhythm and normal heart sounds.   No murmur heard. Pulmonary/Chest: Effort normal. No respiratory distress.  Abdominal: Soft. She exhibits no distension. There is no tenderness. There is no rebound and no guarding.  Musculoskeletal:       Gross swelling of the affected femur, with tenderness to palpation and mild deformity. Neurovascularly intact distally.  Neurological: She is alert and oriented to person, place, and time.  Skin: Skin is warm and dry.    ED Course  Procedures (including critical care time)  Labs Reviewed  CBC WITH DIFFERENTIAL - Abnormal; Notable for the following:    RDW 17.7 (*)     All other components within normal limits  APTT  PROTIME-INR  BASIC METABOLIC PANEL   Dg Hip Complete Left  04/13/2012  *RADIOLOGY REPORT*  Clinical Data: Fall, hip pain  LEFT  HIP - COMPLETE 2+ VIEW  Comparison: None.  Findings: Four views of the left hip submitted.  There is displaced intertrochanteric fracture of the proximal left femur.  Diffuse osteopenia is noted.  IMPRESSION: Displaced fracture of proximal left femur.  Diffuse osteopenia.  Per CMS PQRS reporting requirements (PQRS Measure 24): Given the patient's age of greater than 50 and the fracture site (hip, distal radius, or spine), the patient should be tested for osteoporosis using DXA, and the appropriate treatment considered based on the DXA results.  Original Report Authenticated By: Natasha Mead, M.D.     1. Femur fracture, left   2. Fall       MDM   Date: 04/13/2012  Rate: 75  Rhythm: normal sinus rhythm  QRS Axis: normal  Intervals: normal  ST/T Wave abnormalities: nonspecific ST/T changes  Conduction Disutrbances:none  Narrative Interpretation:   Old EKG Reviewed: unchanged  Pt comes in post mechanical fall onto concrete surface. Pt has left femur pain. No obvious deformity, but she has some swelling. Pt is neurovascularly intact. Xray shows displaced femur fracuture. Dr. Shon Baton from orthopedics informed, and medicine service consulted for admission and clearance.  Additionally, pt is having some shoulder pain and has a mld hematoma on the face, will get CT head and shoulder radiographs as well.           Derwood Kaplan, MD 04/13/12 1945  Derwood Kaplan, MD 06/19/12 1610

## 2012-04-13 NOTE — ED Notes (Signed)
Pt fell on concrete, struck L hip and R elbow.

## 2012-04-14 DIAGNOSIS — D649 Anemia, unspecified: Secondary | ICD-10-CM

## 2012-04-14 DIAGNOSIS — I498 Other specified cardiac arrhythmias: Secondary | ICD-10-CM

## 2012-04-14 DIAGNOSIS — I471 Supraventricular tachycardia: Secondary | ICD-10-CM

## 2012-04-14 LAB — BASIC METABOLIC PANEL
GFR calc Af Amer: 70 mL/min — ABNORMAL LOW (ref >90)
GFR calc non Af Amer: 60 mL/min — ABNORMAL LOW (ref >90)
Potassium: 4.2 mEq/L (ref 3.5–5.1)
Sodium: 137 mEq/L (ref 135–145)

## 2012-04-14 LAB — CBC
Hemoglobin: 10.1 g/dL — ABNORMAL LOW (ref 12.0–15.0)
MCHC: 32.3 g/dL (ref 30.0–36.0)
Platelets: 201 10*3/uL (ref 150–400)
RDW: 17.8 % — ABNORMAL HIGH (ref 11.5–15.5)

## 2012-04-14 LAB — MRSA PCR SCREENING: MRSA by PCR: NEGATIVE

## 2012-04-14 LAB — TROPONIN I
Troponin I: <0.3 ng/mL (ref <0.30)
Troponin I: <0.3 ng/mL (ref <0.30)

## 2012-04-14 LAB — PROTIME-INR
INR: 1.12 (ref 0.00–1.49)
Prothrombin Time: 14.6 seconds (ref 11.6–15.2)

## 2012-04-14 LAB — ABO/RH: ABO/RH(D): A POS

## 2012-04-14 MED ORDER — LACTATED RINGERS IV SOLN: INTRAVENOUS | Status: DC

## 2012-04-14 MED ORDER — HYDROCODONE-ACETAMINOPHEN 5-325 MG PO TABS
1.0000 | ORAL_TABLET | Freq: Four times a day (QID) | ORAL | Status: DC | PRN
  Administered 2012-04-14 – 2012-04-18 (×12): 1 via ORAL
  Filled 2012-04-14 (×14): qty 1

## 2012-04-14 MED ORDER — MENTHOL 3 MG MT LOZG
1.0000 | LOZENGE | OROMUCOSAL | Status: DC | PRN
  Filled 2012-04-14: qty 9

## 2012-04-14 MED ORDER — WARFARIN VIDEO
Freq: Once | Status: AC
  Administered 2012-04-15: 13:00:00

## 2012-04-14 MED ORDER — ONDANSETRON HCL 4 MG PO TABS: 4.0000 mg | ORAL_TABLET | Freq: Four times a day (QID) | ORAL | Status: DC | PRN

## 2012-04-14 MED ORDER — METHOCARBAMOL 100 MG/ML IJ SOLN
500.0000 mg | Freq: Four times a day (QID) | INTRAVENOUS | Status: DC | PRN
  Filled 2012-04-14: qty 5

## 2012-04-14 MED ORDER — ENOXAPARIN SODIUM 40 MG/0.4ML ~~LOC~~ SOLN
40.0000 mg | SUBCUTANEOUS | Status: DC
  Administered 2012-04-14 – 2012-04-17 (×4): 40 mg via SUBCUTANEOUS
  Filled 2012-04-14 (×5): qty 0.4

## 2012-04-14 MED ORDER — METHOCARBAMOL 500 MG PO TABS: 500.0000 mg | ORAL_TABLET | Freq: Four times a day (QID) | ORAL | Status: DC | PRN

## 2012-04-14 MED ORDER — WARFARIN - PHARMACIST DOSING INPATIENT: Freq: Every day | Status: DC

## 2012-04-14 MED ORDER — HYDROMORPHONE HCL PF 1 MG/ML IJ SOLN: 0.2500 mg | INTRAMUSCULAR | Status: DC | PRN

## 2012-04-14 MED ORDER — DOCUSATE SODIUM 100 MG PO CAPS
100.0000 mg | ORAL_CAPSULE | Freq: Two times a day (BID) | ORAL | Status: DC
Start: 2012-04-14 — End: 2012-04-18
  Administered 2012-04-14 – 2012-04-18 (×8): 100 mg via ORAL
  Filled 2012-04-14 (×2): qty 1

## 2012-04-14 MED ORDER — ACETAMINOPHEN 650 MG RE SUPP: 650.0000 mg | Freq: Four times a day (QID) | RECTAL | Status: DC | PRN

## 2012-04-14 MED ORDER — ONDANSETRON HCL 4 MG/2ML IJ SOLN: 4.0000 mg | Freq: Four times a day (QID) | INTRAMUSCULAR | Status: DC | PRN

## 2012-04-14 MED ORDER — PHENOL 1.4 % MT LIQD
1.0000 | OROMUCOSAL | Status: DC | PRN
  Filled 2012-04-14: qty 177

## 2012-04-14 MED ORDER — MORPHINE SULFATE 2 MG/ML IJ SOLN: 0.5000 mg | INTRAMUSCULAR | Status: DC | PRN

## 2012-04-14 MED ORDER — COUMADIN BOOK
1.0000 | Freq: Once | Status: DC
  Filled 2012-04-14: qty 1

## 2012-04-14 MED ORDER — FLEET ENEMA 7-19 GM/118ML RE ENEM: 1.0000 | ENEMA | Freq: Once | RECTAL | Status: AC | PRN

## 2012-04-14 MED ORDER — HYDROCODONE-ACETAMINOPHEN 5-325 MG PO TABS
1.0000 | ORAL_TABLET | Freq: Four times a day (QID) | ORAL | Status: DC | PRN
  Administered 2012-04-14 (×2): 1 via ORAL
  Filled 2012-04-14 (×2): qty 1

## 2012-04-14 MED ORDER — ACETAMINOPHEN 325 MG PO TABS: 650.0000 mg | ORAL_TABLET | Freq: Four times a day (QID) | ORAL | Status: DC | PRN

## 2012-04-14 MED ORDER — VANCOMYCIN HCL IN DEXTROSE 1-5 GM/200ML-% IV SOLN: 1000.0000 mg | INTRAVENOUS | Status: DC

## 2012-04-14 MED ORDER — METOCLOPRAMIDE HCL 10 MG PO TABS: 5.0000 mg | ORAL_TABLET | Freq: Three times a day (TID) | ORAL | Status: DC | PRN

## 2012-04-14 MED ORDER — VANCOMYCIN HCL IN DEXTROSE 1-5 GM/200ML-% IV SOLN
1000.0000 mg | Freq: Two times a day (BID) | INTRAVENOUS | Status: AC
Start: 2012-04-14 — End: 2012-04-14
  Administered 2012-04-14: 1000 mg via INTRAVENOUS
  Filled 2012-04-14: qty 200

## 2012-04-14 MED ORDER — WARFARIN SODIUM 5 MG PO TABS
5.0000 mg | ORAL_TABLET | Freq: Once | ORAL | Status: DC
  Filled 2012-04-14: qty 1

## 2012-04-14 MED ORDER — METOCLOPRAMIDE HCL 5 MG/ML IJ SOLN: 5.0000 mg | Freq: Three times a day (TID) | INTRAMUSCULAR | Status: DC | PRN

## 2012-04-14 MED ORDER — WARFARIN SODIUM 5 MG PO TABS
5.0000 mg | ORAL_TABLET | Freq: Once | ORAL | Status: AC
  Administered 2012-04-14: 5 mg via ORAL
  Filled 2012-04-14: qty 1

## 2012-04-14 MED ORDER — METHOCARBAMOL 100 MG/ML IJ SOLN: 500.0000 mg | Freq: Four times a day (QID) | INTRAMUSCULAR | Status: DC | PRN

## 2012-04-14 MED ORDER — METHOCARBAMOL 500 MG PO TABS
500.0000 mg | ORAL_TABLET | Freq: Four times a day (QID) | ORAL | Status: DC | PRN
  Administered 2012-04-14 – 2012-04-18 (×8): 500 mg via ORAL
  Filled 2012-04-14 (×8): qty 1

## 2012-04-14 MED ORDER — SODIUM CHLORIDE 0.9 % IV SOLN: INTRAVENOUS | Status: DC

## 2012-04-14 MED ORDER — SODIUM CHLORIDE 0.9 % IV SOLN
INTRAVENOUS | Status: DC
  Administered 2012-04-14: 03:00:00 via INTRAVENOUS

## 2012-04-14 NOTE — Progress Notes (Signed)
INITIAL ADULT NUTRITION ASSESSMENT Date: 04/14/2012   Time: 11:09 AM Reason for Assessment: Consult for hip fracture  ASSESSMENT: Female 78 y.o.  Dx: Closed left hip fracture  Hx:  Past Medical History  Diagnosis Date  . Breast cancer, left breast 2003    s/p mastectomy, chemo.  Dr. Park Breed oncologist  . Fracture of humerus, proximal, right, closed 07/2010    s/p hemiarthroplasty  . Osteoporosis   . Cancer     Related Meds:  Scheduled Meds:   . coumadin book  1 each Does not apply Once  . docusate sodium  100 mg Oral BID  . enoxaparin (LOVENOX) injection  40 mg Subcutaneous Q24H  .  morphine injection  4 mg Intravenous Once  . ondansetron  4 mg Intravenous Once  . vancomycin  1,000 mg Intravenous Q12H  . warfarin  5 mg Oral ONCE-1800  . warfarin   Does not apply Once  . Warfarin - Pharmacist Dosing Inpatient   Does not apply q1800  . DISCONTD: vancomycin  1,000 mg Intravenous 60 min Pre-Op   Continuous Infusions:   . sodium chloride    . sodium chloride    . DISCONTD: sodium chloride 75 mL/hr at 04/14/12 0307  . DISCONTD: lactated ringers     PRN Meds:.acetaminophen, acetaminophen, fentaNYL, HYDROcodone-acetaminophen, menthol-cetylpyridinium, methocarbamol (ROBAXIN) IV, methocarbamol, metoCLOPramide (REGLAN) injection, metoCLOPramide, morphine injection, ondansetron (ZOFRAN) IV, ondansetron, phenol, sodium phosphate, DISCONTD: 0.9 % irrigation (POUR BTL), DISCONTD: bupivacaine, DISCONTD: HYDROcodone-acetaminophen, DISCONTD:  HYDROmorphone (DILAUDID) injection, DISCONTD: methocarbamol (ROBAXIN) IV DISCONTD: methocarbamol, DISCONTD:  morphine injection   Ht: 5\' 2"  (157.5 cm)  Wt: 140 lb (63.504 kg)  Ideal Wt: 50.1 kg % Ideal Wt: 127% Wt Readings from Last 10 Encounters:  04/14/12 140 lb (63.504 kg)  04/14/12 140 lb (63.504 kg)  03/31/12 142 lb 1.6 oz (64.456 kg)  10/06/11 150 lb (68.04 kg)  09/21/11 152 lb 8 oz (69.174 kg)  08/05/11 146 lb (66.225 kg)  01/30/11 149  lb 12.8 oz (67.949 kg)    Usual Wt: 140 lb per pt.  % Usual Wt: 100%  Body mass index is 25.61 kg/(m^2). (Overweight)  Food/Nutrition Related Hx: Patient reported her appetite and intake have been well. She reported good PO intake PTA. Patient reported she sometimes has trouble chewing her food but denies the need for softer foods. Patient voiced her snack preferences.   Labs:  CMP     Component Value Date/Time   NA 137 04/14/2012 0319   NA 136 01/30/2010 1126   K 4.2 04/14/2012 0319   K 4.5 01/30/2010 1126   CL 104 04/14/2012 0319   CL 98 01/30/2010 1126   CO2 24 04/14/2012 0319   CO2 28 01/30/2010 1126   GLUCOSE 173* 04/14/2012 0319   GLUCOSE 93 01/30/2010 1126   BUN 19 04/14/2012 0319   BUN 20 01/30/2010 1126   CREATININE 0.86 04/14/2012 0319   CREATININE 1.05 01/30/2011 1605   CALCIUM 8.0* 04/14/2012 0319   CALCIUM 9.4 01/30/2010 1126   PROT 6.1 03/31/2012 0914   PROT 6.5 01/30/2010 1126   ALBUMIN 4.1 03/31/2012 0914   AST 23 03/31/2012 0914   AST 25 01/30/2010 1126   ALT 16 03/31/2012 0914   ALKPHOS 30* 03/31/2012 0914   ALKPHOS 42 01/30/2010 1126   BILITOT 0.6 03/31/2012 0914   BILITOT 0.60 01/30/2010 1126   GFRNONAA 60* 04/14/2012 0319   GFRAA 70* 04/14/2012 0319    Intake/Output Summary (Last 24 hours) at 04/14/12 1110 Last data filed  at 04/14/12 4098  Gross per 24 hour  Intake 2691.25 ml  Output    840 ml  Net 1851.25 ml     Diet Order: General  Supplements/Tube Feeding: none at this time  IVF:    sodium chloride   sodium chloride   DISCONTD: sodium chloride Last Rate: 75 mL/hr at 04/14/12 0307  DISCONTD: lactated ringers     Estimated Nutritional Needs:   Kcal: 1250-1500 Protein: 76-95 grams Fluid: 1 ml per kcal intake  NUTRITION DIAGNOSIS: -Increased nutrient needs.  Status: Ongoing  RELATED TO: healing  AS EVIDENCE BY: pt with recent hip fracture  MONITORING/EVALUATION(Goals): PO intake, weights, labs 1. PO intake > 75% at meals and snacks.   EDUCATION  NEEDS: -No education needs identified at this time  INTERVENTION: 1. Will order patient high protein snacks BID.  2. RD to follow for nutrition plan of care.   Dietitian [phone]  DOCUMENTATION CODES Per approved criteria  -Not Applicable    Iven Finn [facility] 04/14/2012, 11:09 AM

## 2012-04-14 NOTE — Progress Notes (Signed)
    Subjective: 1 Day Post-Op Procedure(s) (LRB): INTRAMEDULLARY (IM) NAIL FEMORAL (Left) Patient reports pain as 5 on 0-10 scale.   Denies CP or SOB.  Foley in place with positive output. Positive flatus.  Objective: Vital signs in last 24 hours: Temp:  [97.4 F (36.3 C)-98.8 F (37.1 C)] 98.4 F (36.9 C) (08/08 1200) Pulse Rate:  [54-89] 69  (08/08 1240) Resp:  [10-20] 18  (08/08 1240) BP: (91-190)/(45-88) 99/51 mmHg (08/08 1240) SpO2:  [95 %-100 %] 97 % (08/08 1240) Weight:  [63.504 kg (140 lb)] 63.504 kg (140 lb) (08/08 0220)  Intake/Output from previous day: 08/07 0701 - 08/08 0700 In: 2691.3 [I.V.:2691.3] Out: 740 [Urine:690; Blood:50] Intake/Output this shift: Total I/O In: -  Out: 260 [Urine:260]  Labs:  [facility] 04/14/12 0319 04/13/12 1902  HGB 10.1* 12.5    Basename 04/14/12 0319 04/13/12 1902  WBC 14.5* 8.3  RBC 3.90 4.76  HCT 31.3* 38.4  PLT 201 260    Basename 04/14/12 0319 04/13/12 1902  NA 137 141  K 4.2 4.0  CL 104 105  CO2 24 25  BUN 19 19  CREATININE 0.86 1.03  GLUCOSE 173* 96  CALCIUM 8.0* 9.1    Basename 04/14/12 0319 04/13/12 1902  LABPT -- --  INR 1.12 1.04    Physical Exam: Neurologically intact Neurovascular intact Dorsiflexion/Plantar flexion intact Incision: moderate drainage from most proximal incision; not active bleeding but blood soaked dressing Moderate amount of bruising about the left hip Compartment soft  Assessment/Plan: 1 Day Post-Op Procedure(s) (LRB): INTRAMEDULLARY (IM) NAIL FEMORAL (Left) Patient doing well s/p surgery Will have nursing change bandage Medicine has written her for bed rest; will clarify reason otherwise she can begin therapy as tolerated per protocol Patients family requests SNF at discharge: [facility] being their first choice.  Have informed them that she needs to clear therapy prior to DC. Continue care at this time.  Gwinda Maine for Dr. Venita Lick [facility] [phone] 04/14/2012, 2:05 PM

## 2012-04-14 NOTE — Care Management Note (Addendum)
    Page 1 of 2   04/15/2012     4:30:31 PM   CARE MANAGEMENT NOTE 04/15/2012  Patient:  Katie Silva, Katie Silva   Account Number:  [id number]  Date Initiated:  04/14/2012  Documentation initiated by:  Lorenda Ishihara  Subjective/Objective Assessment:   78 yo female admitted s/p fall with hip fracture, ORIF with SVT post op. PTA lived at home alone.     Action/Plan:   Awaiting PT/OT input   Anticipated DC Date:  04/18/2012   Anticipated DC Plan:  SKILLED NURSING FACILITY  In-house referral  Clinical Social Worker      DC Planning Services  CM consult      [facility] Choice  NA   Choice offered to / List presented to:  NA   DME arranged  NA      DME agency  NA     HH arranged  NA      HH agency  NA   Status of service:  Completed, signed off Medicare Important Message given?   (If response is "NO", the following Medicare IM given date fields will be blank) Date Medicare IM given:   Date Additional Medicare IM given:    Discharge Disposition:    Per UR Regulation:  Reviewed for med. necessity/level of care/duration of stay  If discussed at Long Length of Stay Meetings, dates discussed:    Comments:  04/15/2012 Raynelle Bring BSN CCM [phone] Pt is currently receiving blood today. CSW working on [employer] [facility]. CM signing off.

## 2012-04-14 NOTE — Progress Notes (Signed)
Report to cheryl denny rn. Lt hip dsgs remain cdi

## 2012-04-14 NOTE — Op Note (Signed)
PHYSICIAN:  Alvy Beal, MD    DATE OF BIRTH:  05/31/1956  DATE OF PROCEDURE: DATE OF DISCHARGE:                              OPERATIVE REPORT   PREOPERATIVE DIAGNOSIS:  Left intertrochanteric hip fracture, comminuted.  POSTOPERATIVE DIAGNOSIS:  Left intertrochanteric hip fracture, comminuted.  OPERATIVE PROCEDURE:  Troch Entry IM nail fixation of the left hip fracture.  FIRST ASSISTANT:  Norval Gable, [place]  INTRAOPERATIVE FINDINGS:  Significant combination of the fracture site with displacement of the lesser and greater troch.  Utilized Curator Entry nail with a 105 lag screw and a 40 mm distal static locking screw.  Intraoperative findings were the patient developed a runs of SVT.  As a result of this, the decision was made to admit the patient either to tele or to the ICU.  Minimal blood loss.  HISTORY:  This is a very pleasant elderly woman who is quite active and otherwise healthy, who fell earlier today at church.  She notes significant left hip pain and inability to ambulate.  As a result, the decision was made to take her to the operating room for an IM nail fixation after appropriate preoperative medical clearance was obtained.  All appropriate risks, benefits, and alternatives were explained to the patient and her daughter and consent was obtained.  OPERATIVE NOTE:  The patient was brought to the operating room, placed supine on the operating room table.  After successful induction of general anesthesia and endotracheal intubation, the patient was placed onto the fracture table.  The left lower extremity which was identified and marked by me in the holding area was placed into the traction device and the right was placed in the flexed and gently abducted position.  A gentle reduction maneuver was performed.  In the lateral plane I noted the inferior displacement of the distal fragment.  This was a highly comminuted fracture.  Because of that, I  elected to use a long standard nail instead of a short cephalomedullary nail.  After the extremity was prepped and draped, a time-out was done confirming patient, procedure, and all other pertinent important data.  An inch incision was made just proximal to the greater troch and a guidepin was advanced down to the tip of the troch.  Using a crutch and gentle traction, I reduced the fracture and then advanced the guidepin across the fracture site into the distal frag.  I confirmed satisfactory position in both the AP and lateral planes.  At this point, I used the all in 1 step drill to ream proximally.  I then sequentially reamed up to a 13.  There was chatter in the isthmus and in the proximal fragment. Once I had the reaming done, I then obtained a 360 mm long 11 cephalomedullary nail and advanced it over the guidepin into the distal fragment.  The fracture itself reduced adequately.  Once the nail was properly down, I removed the central guidepin.  I then made a second incision in order to place the lag screw.  I placed the trocar and then drilled across the femur through the nail and into the proximal frag.  I had satisfactory position of this guidepin.  Because I was concerned about the proximal fragment rotating, I placed a derotation pin as well. At this point with the derotation pin in place, I then measured and elected to use  a 105 screw.  I drilled and tapped and then gently started inserting a lag screw.  Despite holding a retraction and the derotational pain, there were still some rotations of the proximal fragment.  In the AP plane now I had locked in and was well reduced.  I then decided that I have the best reduction that I could get and I released the traction on the foot and then applied a compression at the fracture site.  I then locked the nail via lag screw and backed off a quarter turn.  I then removed the inserting device of the nail and obtained a lateral view of  the distal locking screw.  I then made an incision and then drilled across the femur through the distal static locking hole and into the contralateral cortex.  I measured to be 40, obtained 40 distal locking screw, and I then placed into position.  I then took final x-rays, AP and laterals at the hip and at the knee and the alignment was satisfactory.  All wounds were copiously irrigated. The deep fascia was closed with interrupted #1 Vicryl suture and superficial with 2-0 Vicryl sutures and staples were used for the skin and the other 2 wounds were washed out.  Deep layers were closed with interrupted #1 Vicryl suture and 2-0 Vicryl sutures and staples.  Dry dressings were applied.  The patient was extubated, transferred to PACU without incident.  At the end of the case, needle and sponge counts were correct.  The patient did have runs of SVT and so the anesthesiologist and CRNA requested either tele or ICU monitoring.  This will be discussed with the medical admission team.     Alvy Beal, MD     DDB/MEDQ  D:  04/13/2012  T:  04/14/2012  Job:  [id number]  cc:   [facility]

## 2012-04-14 NOTE — Progress Notes (Signed)
ANTICOAGULATION CONSULT NOTE - Initial Consult  Pharmacy Consult for warfarin Indication: VTE prophylaxis  Allergies  Allergen Reactions  . Fexofenadine Hcl Other (See Comments)    Doesn't agree with patient  . Penicillins     Patient Measurements: Height: 5\' 2"  (157.5 cm) Weight: 140 lb (63.504 kg) IBW/kg (Calculated) : 50.1  Heparin Dosing Weight:   Vital Signs: Temp: 97.8 F (36.6 C) (08/08 0208) Temp src: Oral (08/07 1923) BP: 120/75 mmHg (08/08 0208) Pulse Rate: 77  (08/08 0220)  Labs:  Basename 04/14/12 0319 04/13/12 1902  HGB 10.1* 12.5  HCT 31.3* 38.4  PLT 201 260  APTT -- 31  LABPROT 14.6 13.8  INR 1.12 1.04  HEPARINUNFRC -- --  CREATININE 0.86 1.03  CKTOTAL -- --  CKMB -- --  TROPONINI <0.30 --    Estimated Creatinine Clearance: 42.7 ml/min (by C-G formula based on Cr of 0.86).   Medical History: Past Medical History  Diagnosis Date  . Breast cancer, left breast 2003    s/p mastectomy, chemo.  Dr. Park Breed oncologist  . Fracture of humerus, proximal, right, closed 07/2010    s/p hemiarthroplasty  . Osteoporosis   . Cancer     Medications:  Scheduled:    . coumadin book  1 each Does not apply Once  . docusate sodium  100 mg Oral BID  . enoxaparin (LOVENOX) injection  40 mg Subcutaneous Q24H  .  morphine injection  4 mg Intravenous Once  . ondansetron  4 mg Intravenous Once  . vancomycin  1,000 mg Intravenous Q12H  . warfarin  5 mg Oral ONCE-1800  . warfarin   Does not apply Once  . Warfarin - Pharmacist Dosing Inpatient   Does not apply q1800  . DISCONTD: vancomycin  1,000 mg Intravenous 60 min Pre-Op    Assessment: Patient is s/p Left intertrochanteric hip fracture repair.  Warfarin per pharmacy ordered for VTE prophylaxis.  Goal of Therapy:  INR 2-3    Plan:  Daily PT/INR, Coumadin Book/video Warfarin 5mg  po x1 at [address] [place] 04/14/2012,4:25 AM

## 2012-04-14 NOTE — Progress Notes (Signed)
Clinical Social Work Department CLINICAL SOCIAL WORK PLACEMENT NOTE 04/14/2012  Patient:  Katie Silva, Katie Silva  Account Number:  [id number] Admit date:  04/13/2012  Clinical Social Worker:  Cori Razor, LCSW  Date/time:  04/14/2012 05:02 PM  Clinical Social Work is seeking post-discharge placement for this patient at the following level of care:   SKILLED NURSING   (*CSW will update this form in Epic as items are completed)   04/14/2012  Patient/family provided with [facility] Department of Clinical Social Work's list of facilities offering this level of care within the geographic area requested by the patient (or if unable, by the patient's family).  04/14/2012  Patient/family informed of their freedom to choose among providers that offer the needed level of care, that participate in Medicare, Medicaid or managed care program needed by the patient, have an available bed and are willing to accept the patient.    Patient/family informed of [facility]' ownership interest in [facility], as well as of the fact that they are under no obligation to receive care at this facility.  PASARR submitted to EDS on 04/14/2012 PASARR number received from EDS on 04/14/2012  FL2 transmitted to all facilities in geographic area requested by pt/family on  04/14/2012 FL2 transmitted to all facilities within larger geographic area on   Patient informed that his/her managed care company has contracts with or will negotiate with  certain facilities, including the following:     Patient/family informed of bed offers received:  04/14/2012 Patient chooses bed at  Physician recommends and patient chooses bed at    Patient to be transferred to  on   Patient to be transferred to facility by   The following physician request were entered in Epic:   Additional Comments:  Cori Razor LCSW [phone]

## 2012-04-14 NOTE — Progress Notes (Signed)
[name] PROGRESS NOTE  Katie Silva WUJ:[id number] DOB: 07-May-1926 DOA: 04/13/2012 PCP: [name], MD  Assessment/Plan: 1. Left hip fracture/mechanical fall: Doing well. Status-post surgery 8/7. VTE prophylaxis, activity per orthopedics. Case management and CSW consulted. 2. Post-operative anemia: Asymptomatic. Check CBC in AM. No bleeding noted. 3. SVT: Long-standing history of same. Patient well-acquainted with vagal maneuvers. Occurs at home in context of severe pain. Check TSH. No other evaluation is suggested. No need for further telemetry or cardiology consultation.  4. History of left breast cancer  Code Status: Full Family Communication: discussed with daughters at bedside Disposition Plan: Pending therapy evaluations  [name], MD  [name] Team 4 Pager [phone]. If 8PM-8AM, please contact night-coverage at [email or web address], password [facility] 04/14/2012, 8:28 AM  LOS: 1 day   Brief narrative: 78 year old woman suffered left hip fracture s/p mechanical fall. Brief episodes of SVT perioperatively.  Consultants:  Orthopedics  PT  OT  Procedures:  Troch Entry IM nail fixation of the left hip fracture.  HPI/Subjective: Feels well. No pain. No n/v.  Objective: Filed Vitals:   04/14/12 0600 04/14/12 0625 04/14/12 0700 04/14/12 0800  BP:  104/45 105/60   Pulse: 66 64 62   Temp:    97.4 F (36.3 C)  TempSrc:    Oral  Resp: 15 13 15    Height:      Weight:      SpO2: 99% 100% 100%     Intake/Output Summary (Last 24 hours) at 04/14/12 0828 Last data filed at 04/14/12 0700  Gross per 24 hour  Intake 2691.25 ml  Output    740 ml  Net 1951.25 ml   Wt Readings from Last 3 Encounters:  04/14/12 63.504 kg (140 lb)  04/14/12 63.504 kg (140 lb)  03/31/12 64.456 kg (142 lb 1.6 oz)    Exam:   General:  Appears well.  Cardiovascular: RRR, no m/r/g. No LE edema.  Telemetry: SR, brief episodes of SVT  Respiratory: CTA bilaterally, no  w/r/r. Normal respiratory effort.  Data Reviewed: Basic Metabolic Panel:  Lab 04/14/12 8657 04/13/12 1902  NA 137 141  K 4.2 4.0  CL 104 105  CO2 24 25  GLUCOSE 173* 96  BUN 19 19  CREATININE 0.86 1.03  CALCIUM 8.0* 9.1  MG -- --  PHOS -- --   CBC:  Lab 04/14/12 0319 04/13/12 1902  WBC 14.5* 8.3  NEUTROABS -- 4.4  HGB 10.1* 12.5  HCT 31.3* 38.4  MCV 80.3 80.7  PLT 201 260   Cardiac Enzymes:  Lab 04/14/12 0319  CKTOTAL --  CKMB --  CKMBINDEX --  TROPONINI <0.30   Recent Results (from the past 240 hour(s))  MRSA PCR SCREENING     Status: Normal   Collection Time   04/14/12  2:15 AM      Component Value Range Status Comment   MRSA by PCR NEGATIVE  NEGATIVE Final     Studies: Dg Femur Left  04/13/2012  *RADIOLOGY REPORT*  Clinical Data: Left hip intertrochanteric fracture, operative fixation  LEFT FEMUR - 2 VIEW  Comparison: 04/13/2012  Findings: Left hip IM rod and screw fixation through the intertrochanteric fracture with improved alignment.  Fracture line remains visible medially.  Single distal fixation pin for the IM rod.  IMPRESSION: Status post left femur IM rod and screw fixation for a the left hip intertrochanteric fracture.  Improved alignment.  Original Report Authenticated By: Judie Petit. Ruel Favors, M.D.   Ct Head Wo Contrast  04/13/2012  *  RADIOLOGY REPORT*  Clinical Data: Hypertension, fall  CT HEAD WITHOUT CONTRAST  Technique:  Contiguous axial images were obtained from the base of the skull through the vertex without contrast.  Comparison: 10/08/2009  Findings: Age-related brain atrophy and white matter microvascular ischemic changes.  No acute intracranial hemorrhage, definite fraction, mass lesion, midline shift, herniation, hydrocephalus, or extra-axial fluid collection.  Cisterns patent.  No cerebellar abnormality.  Mastoid sinuses clear.  Calvarial thickening noted.  IMPRESSION: Stable exam.  No acute intracranial finding  Original Report Authenticated By: Judie Petit.  Ruel Favors, M.D.   Scheduled Meds:   . coumadin book  1 each Does not apply Once  . docusate sodium  100 mg Oral BID  . enoxaparin (LOVENOX) injection  40 mg Subcutaneous Q24H  .  morphine injection  4 mg Intravenous Once  . ondansetron  4 mg Intravenous Once  . vancomycin  1,000 mg Intravenous Q12H  . warfarin  5 mg Oral ONCE-1800  . warfarin   Does not apply Once  . Warfarin - Pharmacist Dosing Inpatient   Does not apply q1800  . DISCONTD: vancomycin  1,000 mg Intravenous 60 min Pre-Op   Continuous Infusions:   . sodium chloride    . sodium chloride    . DISCONTD: sodium chloride 75 mL/hr at 04/14/12 0307  . DISCONTD: lactated ringers      Principal Problem:  *Closed left hip fracture Active Problems:  Fall  Postoperative anemia  SVT (supraventricular tachycardia)     [name], MD  [name] Team 4 Pager [phone]. If 8PM-8AM, please contact night-coverage at [email or web address], password [facility] 04/14/2012, 8:28 AM  LOS: 1 day   Time spent: 20 minutes

## 2012-04-14 NOTE — Evaluation (Signed)
Physical Therapy Evaluation Patient Details Name: Katie Silva MRN: [id number] DOB: 1926-04-20 Today's Date: 04/14/2012 Time: 5621-3086 PT Time Calculation (min): 23 min  PT Assessment / Plan / Recommendation Clinical Impression  pt is s/p IM nail left femur and will benefit from PT in acut venue as well as post acute therapies at some level    PT Assessment  Patient needs continued PT services    Follow Up Recommendations  Skilled nursing facility    Barriers to Discharge        Equipment Recommendations  Defer to next venue    Recommendations for Other Services     Frequency Min 4X/week    Precautions / Restrictions Precautions Precautions: Fall Restrictions Weight Bearing Restrictions: Yes LLE Weight Bearing: Partial weight bearing LLE Partial Weight Bearing Percentage or Pounds: 50%   Pertinent Vitals/Pain       Mobility  Bed Mobility Bed Mobility: Sit to Supine Sit to Supine: 1: +2 Total assist Sit to Supine: Patient Percentage: 60% Details for Bed Mobility Assistance: cues for hand placement, sequence Transfers Transfers: Sit to Stand;Stand to Sit;Stand Pivot Transfers Sit to Stand: 1: +2 Total assist Sit to Stand: Patient Percentage: 50% Stand to Sit: 3: Mod assist Stand Pivot Transfers: 1: +2 Total assist Stand Pivot Transfers: Patient Percentage: 40% Details for Transfer Assistance: multi modal cues for hand placement and safety and PWB Ambulation/Gait Ambulation/Gait Assistance: Not tested (comment)    Exercises     PT Diagnosis: Difficulty walking  PT Problem List: Decreased strength;Decreased range of motion;Decreased activity tolerance;Decreased balance;Decreased mobility;Decreased knowledge of use of DME;Decreased knowledge of precautions PT Treatment Interventions: DME instruction;Gait training;Functional mobility training;Therapeutic activities;Therapeutic exercise;Patient/family education   PT Goals Acute Rehab PT Goals PT Goal  Formulation: With patient Time For Goal Achievement: 04/26/12 Potential to Achieve Goals: Good Pt will go Supine/Side to Sit: with supervision PT Goal: Supine/Side to Sit - Progress: Goal set today Pt will go Sit to Stand: with supervision PT Goal: Sit to Stand - Progress: Goal set today Pt will Ambulate: 51 - 150 feet;with supervision;with rolling walker PT Goal: Ambulate - Progress: Goal set today  Visit Information  Last PT Received On: 04/14/12    Subjective Data  Subjective: I don't hurt unless I move. Patient Stated Goal: get stronger   Prior Functioning  Home Living Lives With: Alone Available Help at Discharge: Available PRN/intermittently Type of Home: House Home Access: Stairs to enter Secretary/administrator of Steps: 5 Entrance Stairs-Rails: Right Home Layout: One level Home Adaptive Equipment: Bedside commode/3-in-1 Additional Comments: doesn't use  Prior Function Level of Independence: Independent Able to Take Stairs?: Yes Communication Communication: No difficulties    Cognition  Overall Cognitive Status: Appears within functional limits for tasks assessed/performed Arousal/Alertness: Awake/alert Orientation Level: Appears intact for tasks assessed Behavior During Session: [facility] for tasks performed    Extremity/Trunk Assessment Right Upper Extremity Assessment RUE ROM/Strength/Tone: [facility] for tasks assessed;Deficits RUE ROM/Strength/Tone Deficits: AROM shoulder  flexion  to 90degrees, able to get increased ROM with self assist  Left Upper Extremity Assessment LUE ROM/Strength/Tone: [facility] for tasks assessed Right Lower Extremity Assessment RLE ROM/Strength/Tone: [facility] for tasks assessed Left Lower Extremity Assessment LLE ROM/Strength/Tone: Deficits;Due to pain;Unable to fully assess   Balance    End of Session PT - End of Session Activity Tolerance: Patient tolerated treatment well Patient left: in chair;with call bell/phone within reach;with family/visitor  present Nurse Communication: Mobility status  GP     [facility] 04/14/2012, 4:07 PM

## 2012-04-14 NOTE — Progress Notes (Signed)
Clinical Social Work Department BRIEF PSYCHOSOCIAL ASSESSMENT 04/14/2012  Patient:  Katie Silva, Katie Silva     Account Number:  [id number]     Admit date:  04/13/2012  Clinical Social Worker:  Candie Chroman  Date/Time:  04/14/2012 04:52 PM  Referred by:  Physician  Date Referred:  04/14/2012 Referred for  SNF Placement   Other Referral:   Interview type:   Other interview type:    PSYCHOSOCIAL DATA Living Status:  ALONE Admitted from facility:   Level of care:   Primary support name:  Freddi Starr Primary support relationship to patient:  CHILD, ADULT Degree of support available:   supportive    CURRENT CONCERNS Current Concerns  Post-Acute Placement   Other Concerns:    SOCIAL WORK ASSESSMENT / PLAN Pt is an 78 yr old female living at home prior to hospitalization. CSW met with pt/family to assist with d/c planning.PT has recommended ST SNF placement and pt/family are in agreement with this plan. Pt has [facility] which requires prior approval. SNF search has been initiated and bed offers provided to pt/family. [facility] will contact CSW in am with placement decision. PT/family are considering other SNF's in case [facility] is unable to offer SNF bed.   Assessment/plan status:  Psychosocial Support/Ongoing Assessment of Needs Other assessment/ plan:   Information/referral to community resources:   SNF list provided    PATIENT'S/FAMILY'S RESPONSE TO PLAN OF CARE: Pt would like ST Rehab at [facility] but will consider all SNF options in network with insurance.    Cori Razor LCSW [phone]

## 2012-04-15 ENCOUNTER — Encounter (HOSPITAL_COMMUNITY): Payer: Self-pay | Admitting: Orthopedic Surgery

## 2012-04-15 LAB — CBC
Platelets: 167 10*3/uL (ref 150–400)
RBC: 2.95 MIL/uL — ABNORMAL LOW (ref 3.87–5.11)
RDW: 18.1 % — ABNORMAL HIGH (ref 11.5–15.5)
WBC: 9.2 10*3/uL (ref 4.0–10.5)

## 2012-04-15 LAB — PROTIME-INR
INR: 1.13 (ref 0.00–1.49)
Prothrombin Time: 14.7 seconds (ref 11.6–15.2)

## 2012-04-15 LAB — TSH: TSH: 1.781 u[IU]/mL (ref 0.350–4.500)

## 2012-04-15 LAB — PREPARE RBC (CROSSMATCH)

## 2012-04-15 MED ORDER — WARFARIN SODIUM 5 MG PO TABS
5.0000 mg | ORAL_TABLET | Freq: Once | ORAL | Status: AC
  Administered 2012-04-15: 5 mg via ORAL
  Filled 2012-04-15: qty 1

## 2012-04-15 NOTE — Clinical Documentation Improvement (Signed)
Anemia Blood Loss Clarification  THIS DOCUMENT IS NOT A PERMANENT PART OF THE MEDICAL RECORD  RESPOND TO THE THIS QUERY, FOLLOW THE INSTRUCTIONS BELOW:  1. If needed, update documentation for the patient's encounter via the notes activity.  2. Access this query again and click edit on the In [employer].  3. After updating, or not, click F2 to complete all highlighted (required) fields concerning your review. Select "additional documentation in the medical record" OR "no additional documentation provided".  4. Click Sign note button.  5. The deficiency will fall out of your In Basket *Please let us know if you are not able to complete this workflow by phone or e-mail (listed below).        04/15/12  Dear, Katie Silva, [name]  In an effort to better capture your patient's severity of illness, reflect appropriate length of stay and utilization of resources, a review of the patient medical record has revealed the following indicators.    Based on your clinical judgment, please clarify and document in a progress note and/or discharge summary the clinical condition associated with the following supporting information:  In responding to this query please exercise your independent judgment.  The fact that a query is asked, does not imply that any particular answer is desired or expected.  Pt admitted for INTRAMEDULLARY (IM) NAIL FEMORAL (Left) due to mech fall.  According to [facility] pt with "Postoperative anemia"   Please clarify if the "Postoperative anemia" can be further specified as one of the diagnoses listed below and document in pn or d/c summary.    Possible Clinical Conditions?   " Acute Blood Loss Anemia (expected)  " Acute Blood Loss Anemia  " Acute on chronic blood loss anemia   " Other Condition________________  " Cannot Clinically Determine  Risk Factors: (recent surgery, pre op anemia, EBL in OR)  Supporting Information:  Signs and Symptoms  Closed hip  fracture, Osteoporosis/Post op anemia  Diagnostics: Component     Latest Ref Rng 04/14/2012 04/15/2012         3:19 AM   Hemoglobin     12.0 - 15.0 g/dL 95.6 (L) 7.8 (L)  HCT     36.0 - 46.0 % 31.3 (L) 23.6 (L)   Treatments: IV fluids / NS Lactated ringers Serial H&H monitoring Per PN 04/15/12 Hemoglobin down from 10.1 to 7.8 will type and cross match and transfuse 2 units Order to Transfuse RBC     Reviewed: additional documentation in the medical record  Thank You,  Enis Slipper  RN, BSN, MSN/Inf, CCDS Clinical Documentation Specialist [facility] HIM Dept Pager: [phone] / E-mail: [email or web address]  [facility] [employer]

## 2012-04-15 NOTE — Progress Notes (Signed)
    Subjective: 2 Days Post-Op Procedure(s) (LRB): INTRAMEDULLARY (IM) NAIL FEMORAL (Left) Patient reports pain as 5 on 0-10 scale.   Denies CP or SOB.  Voiding without difficulty. Positive flatus. Reports weakness.  Objective: Vital signs in last 24 hours: Temp:  [98 F (36.7 C)-98.4 F (36.9 C)] 98 F (36.7 C) (08/09 0539) Pulse Rate:  [60-75] 73  (08/09 0539) Resp:  [14-18] 14  (08/09 0739) BP: (99-133)/(51-71) 133/71 mmHg (08/09 0539) SpO2:  [97 %-98 %] 97 % (08/09 0539)  Intake/Output from previous day: 08/08 0701 - 08/09 0700 In: -  Out: 910 [Urine:910] Intake/Output this shift: Total I/O In: 240 [P.O.:240] Out: 150 [Urine:150]  Labs:  [facility] 04/15/12 0401 04/14/12 0319 04/13/12 1902  HGB 7.8* 10.1* 12.5    Basename 04/15/12 0401 04/14/12 0319  WBC 9.2 14.5*  RBC 2.95* 3.90  HCT 23.6* 31.3*  PLT 167 201    Basename 04/14/12 0319 04/13/12 1902  NA 137 141  K 4.2 4.0  CL 104 105  CO2 24 25  BUN 19 19  CREATININE 0.86 1.03  GLUCOSE 173* 96  CALCIUM 8.0* 9.1    Basename 04/15/12 0401 04/14/12 0319  LABPT -- --  INR 1.13 1.12    Physical Exam: Neurologically intact ABD soft Neurovascular intact Dorsiflexion/Plantar flexion intact Incision: dressing C/D/I Compartment soft  Assessment/Plan: 2 Days Post-Op Procedure(s) (LRB): INTRAMEDULLARY (IM) NAIL FEMORAL (Left) Hemoglobin down from 10.1 to 7.8 will type and cross match and transfuse 2 units Up with therapy as she can tolerate May begin planning to discharge to SNF likely Monday  [place]a [place] for Dr. Venita Lick [facility] [phone] 04/15/2012, 8:50 AM

## 2012-04-15 NOTE — Progress Notes (Signed)
Physical Therapy Treatment Patient Details Name: Katie Silva MRN: [id number] DOB: 1926/03/25 Today's Date: 04/15/2012 Time: 1040-1100 PT Time Calculation (min): 20 min  PT Assessment / Plan / Recommendation Comments on Treatment Session  pt to get blood today    Follow Up Recommendations  Skilled nursing facility    Barriers to Discharge        Equipment Recommendations  Defer to next venue    Recommendations for Other Services    Frequency Min 4X/week   Plan Discharge plan remains appropriate;Frequency remains appropriate    Precautions / Restrictions Precautions Precautions: Fall Restrictions LLE Weight Bearing: Partial weight bearing LLE Partial Weight Bearing Percentage or Pounds: 50%   Pertinent Vitals/Pain     Mobility       Exercises Total Joint Exercises Ankle Circles/Pumps: AROM;Both;10 reps Quad Sets: AROM;Both;10 reps Short Arc Quad: AAROM;10 reps;Left Heel Slides: AAROM;Left;10 reps   PT Diagnosis:    PT Problem List:   PT Treatment Interventions:     PT Goals Acute Rehab PT Goals Time For Goal Achievement: 04/26/12 Potential to Achieve Goals: Good  Visit Information  Last PT Received On: 04/15/12    Subjective Data  Subjective: i can only do one thing at once   Cognition  Overall Cognitive Status: Appears within functional limits for tasks assessed/performed Arousal/Alertness: Awake/alert Orientation Level: Appears intact for tasks assessed Behavior During Session: [facility] for tasks performed    Balance     End of Session PT - End of Session Activity Tolerance: Patient tolerated treatment well Patient left: in bed;with call bell/phone within reach;with family/visitor present   GP     [facility] 04/15/2012, 11:53 AM

## 2012-04-15 NOTE — Progress Notes (Signed)
CSW assisting with d/c planning. Pt/family have chosen [facility] ( [facility] ) for ST SNF placement. Pt/family realize [facility] is out of network with insurance which will result in additional out of pocket costs. SNF began [facility] authorization process earlier this afternoon. Approval has not yet been received. Pt/family/MD have been updated. CSW will continue to follow to assist with d/c planning to SNF.  Cori Razor LCSW [phone]

## 2012-04-15 NOTE — Progress Notes (Signed)
ANTICOAGULATION CONSULT NOTE   Pharmacy Consult for warfarin Indication: VTE prophylaxis  Allergies  Allergen Reactions  . Fexofenadine Hcl Other (See Comments)    Doesn't agree with patient  . Penicillins     Patient Measurements: Height: 5\' 2"  (157.5 cm) Weight: 140 lb (63.504 kg) IBW/kg (Calculated) : 50.1   Vital Signs: Temp: 98 F (36.7 C) (08/09 0539) Temp src: Oral (08/09 0539) BP: 133/71 mmHg (08/09 0539) Pulse Rate: 73  (08/09 0539)  Labs:  Basename 04/15/12 0401 04/14/12 1031 04/14/12 0319 04/13/12 1902  HGB 7.8* -- 10.1* --  HCT 23.6* -- 31.3* 38.4  PLT 167 -- 201 260  APTT -- -- -- 31  LABPROT 14.7 -- 14.6 13.8  INR 1.13 -- 1.12 1.04  HEPARINUNFRC -- -- -- --  CREATININE -- -- 0.86 1.03  CKTOTAL -- -- -- --  CKMB -- -- -- --  TROPONINI -- <0.30 <0.30 --    Estimated Creatinine Clearance: 42.7 ml/min (by C-G formula based on Cr of 0.86).   Medical History: Past Medical History  Diagnosis Date  . Breast cancer, left breast 2003    s/p mastectomy, chemo.  Dr. Park Breed oncologist  . Fracture of humerus, proximal, right, closed 07/2010    s/p hemiarthroplasty  . Osteoporosis   . Cancer     Medications:  Scheduled:     . coumadin book  1 each Does not apply Once  . docusate sodium  100 mg Oral BID  . enoxaparin (LOVENOX) injection  40 mg Subcutaneous Q24H  . vancomycin  1,000 mg Intravenous Q12H  . warfarin  5 mg Oral Once  . warfarin   Does not apply Once  . Warfarin - Pharmacist Dosing Inpatient   Does not apply q1800  . DISCONTD: warfarin  5 mg Oral ONCE-1800    Assessment:  86 YOF s/p Left intertrochanteric hip fracture repair 8/7.  Warfarin and Lovenox for VTE prophylaxis ordered 8/8.  Post-op anemia, Hgb down to 7.8, no bleeding reported, 2 units PRBC ordered  INR remains subtherapeutic as expected after only one dose, increasing slowly  Goal of Therapy:  INR 2-3   Plan:   Warfarin 5mg  PO at 1800 x1  Continue Lovenox as  ordered until INR 1.8  Daily PT/INR  Warfarin education prior to discharge  Lynann Beaver PharmD, BCPS Pager [phone] 04/15/2012 9:45 AM

## 2012-04-15 NOTE — Progress Notes (Signed)
04/15/12 1500  PT Visit Information  Last PT Received On 04/15/12  Assistance Needed +2  PT Time Calculation  PT Start Time 1445  PT Stop Time 1518  PT Time Calculation (min) 33 min  Subjective Data  Subjective i was taking a nap  Precautions  Precautions Fall  Restrictions  LLE Weight Bearing PWB  LLE Partial Weight Bearing Percentage or Pounds 50%  Cognition  Overall Cognitive Status Appears within functional limits for tasks assessed/performed  Arousal/Alertness Awake/alert  Orientation Level Appears intact for tasks assessed  Behavior During Session [facility] for tasks performed  Bed Mobility  Bed Mobility Sit to Supine  Sit to Supine 1: +2 Total assist  Sit to Supine: Patient Percentage 50%  Details for Bed Mobility Assistance cues for hand placement, sequence  Transfers  Transfers Sit to Stand;Stand to Sit  Sit to Stand 1: +2 Total assist  Sit to Stand: Patient Percentage 50%  Stand to Sit 1: +2 Total assist  Stand to Sit: Patient Percentage 50%  Details for Transfer Assistance multi modal cues for hand placement and safety and PWB  Ambulation/Gait  Ambulation/Gait Assistance 1: +2 Total assist  Ambulation/Gait: Patient Percentage 50%  Ambulation Distance (Feet) 10 Feet  Assistive device Rolling walker  Ambulation/Gait Assistance Details multi modal cues for sequence, wt shift, PWB, posture;  General Gait Details pt still dizzy with amb, between units of blood; BP 150/66  PT - End of Session  Equipment Utilized During Treatment Gait belt  Activity Tolerance Patient limited by fatigue  Patient left in chair;with call bell/phone within reach;with family/visitor present;with nursing in room  Nurse Communication Patient requests pain meds  PT - Assessment/Plan  Comments on Treatment Session progressing slowly, will benefit from SNF post acute  PT Plan Discharge plan remains appropriate;Frequency remains appropriate  PT Frequency Min 4X/week  Follow Up Recommendations  Skilled nursing facility  Equipment Recommended Defer to next venue  Acute Rehab PT Goals  Time For Goal Achievement 04/26/12  Potential to Achieve Goals Good  Pt will go Supine/Side to Sit with supervision  PT Goal: Supine/Side to Sit - Progress Progressing toward goal  Pt will go Sit to Stand with supervision  PT Goal: Sit to Stand - Progress Progressing toward goal  Pt will Ambulate 51 - 150 feet;with supervision;with rolling walker  PT Goal: Ambulate - Progress Progressing toward goal

## 2012-04-15 NOTE — Progress Notes (Signed)
[name] PROGRESS NOTE  Katie Silva UXL:[id number] DOB: 03/15/1926 DOA: 04/13/2012 PCP: [name], MD  Assessment/Plan: 1. Left hip fracture/mechanical fall: Doing well status-post surgery 8/7. VTE prophylaxis, activity per orthopedics. SNF  8/9. 2. Post-operative anemia: Asymptomatic but significantly lower today. Receiving 2 units PRBC today. Check CBC in AM. 3. SVT: Long-standing history of same. Patient well-acquainted with vagal maneuvers. Occurs at home in context of severe pain. Check TSH. No other evaluation is suggested. No need for further telemetry or cardiology consultation.  4. History of left breast cancer  Code Status: Full Family Communication: discussed with daughter at bedside Disposition Plan: SNF when stable.  [name], MD  [name] Team 4 Pager [phone]. If 8PM-8AM, please contact night-coverage at [email or web address], password [facility] 04/15/2012, 5:06 PM  LOS: 2 days   Brief narrative: 78 year old woman suffered left hip fracture s/p mechanical fall. Brief episodes of SVT perioperatively.  Consultants:  Orthopedics  PT--SNF  OT  Procedures:  Troch Entry IM nail fixation of the left hip fracture.  HPI/Subjective: Hemoglobin down to 7.8. Being transfused two units PRBCs. No complaints.  Objective: Filed Vitals:   04/15/12 1516 04/15/12 1527 04/15/12 1602 04/15/12 1648  BP:  146/65 146/62 159/79  Pulse:  74 78 76  Temp:  98.4 F (36.9 C) 98.6 F (37 C) 98.7 F (37.1 C)  TempSrc:  Oral Oral Oral  Resp: 18 20 18 20   Height:      Weight:      SpO2:        Intake/Output Summary (Last 24 hours) at 04/15/12 1706 Last data filed at 04/15/12 1653  Gross per 24 hour  Intake    505 ml  Output   1375 ml  Net   -870 ml   Wt Readings from Last 3 Encounters:  04/14/12 63.504 kg (140 lb)  04/14/12 63.504 kg (140 lb)  03/31/12 64.456 kg (142 lb 1.6 oz)    Exam:   General:  Appears well, weak.  Cardiovascular: RRR, no m/r/g.  No LE edema.  Respiratory: CTA bilaterally, no w/r/r. Normal respiratory effort.  Data Reviewed: Basic Metabolic Panel:  Lab 04/14/12 3474 04/13/12 1902  NA 137 141  K 4.2 4.0  CL 104 105  CO2 24 25  GLUCOSE 173* 96  BUN 19 19  CREATININE 0.86 1.03  CALCIUM 8.0* 9.1  MG -- --  PHOS -- --   CBC:  Lab 04/15/12 0401 04/14/12 0319 04/13/12 1902  WBC 9.2 14.5* 8.3  NEUTROABS -- -- 4.4  HGB 7.8* 10.1* 12.5  HCT 23.6* 31.3* 38.4  MCV 80.0 80.3 80.7  PLT 167 201 260   Cardiac Enzymes:  Lab 04/14/12 1031 04/14/12 0319  CKTOTAL -- --  CKMB -- --  CKMBINDEX -- --  TROPONINI <0.30 <0.30   Recent Results (from the past 240 hour(s))  MRSA PCR SCREENING     Status: Normal   Collection Time   04/14/12  2:15 AM      Component Value Range Status Comment   MRSA by PCR NEGATIVE  NEGATIVE Final     Studies: Dg Femur Left  04/13/2012  *RADIOLOGY REPORT*  Clinical Data: Left hip intertrochanteric fracture, operative fixation  LEFT FEMUR - 2 VIEW  Comparison: 04/13/2012  Findings: Left hip IM rod and screw fixation through the intertrochanteric fracture with improved alignment.  Fracture line remains visible medially.  Single distal fixation pin for the IM rod.  IMPRESSION: Status post left femur IM rod and screw fixation for  a the left hip intertrochanteric fracture.  Improved alignment.  Original Report Authenticated By: Judie Petit. Ruel Favors, M.D.   Ct Head Wo Contrast  04/13/2012  *RADIOLOGY REPORT*  Clinical Data: Hypertension, fall  CT HEAD WITHOUT CONTRAST  Technique:  Contiguous axial images were obtained from the base of the skull through the vertex without contrast.  Comparison: 10/08/2009  Findings: Age-related brain atrophy and white matter microvascular ischemic changes.  No acute intracranial hemorrhage, definite fraction, mass lesion, midline shift, herniation, hydrocephalus, or extra-axial fluid collection.  Cisterns patent.  No cerebellar abnormality.  Mastoid sinuses clear.   Calvarial thickening noted.  IMPRESSION: Stable exam.  No acute intracranial finding  Original Report Authenticated By: Judie Petit. Ruel Favors, M.D.   Scheduled Meds:    . coumadin book  1 each Does not apply Once  . docusate sodium  100 mg Oral BID  . enoxaparin (LOVENOX) injection  40 mg Subcutaneous Q24H  . warfarin  5 mg Oral ONCE-1800  . warfarin   Does not apply Once  . Warfarin - Pharmacist Dosing Inpatient   Does not apply q1800   Continuous Infusions:   Principal Problem:  *Closed left hip fracture Active Problems:  Fall  Postoperative anemia  SVT (supraventricular tachycardia)     [name], MD  [name] Team 4 Pager [phone]. If 8PM-8AM, please contact night-coverage at [email or web address], password [facility] 04/15/2012, 5:06 PM  LOS: 2 days   Time spent: 20 minutes

## 2012-04-15 NOTE — Progress Notes (Signed)
OT Cancellation Note  Treatment cancelled today due to medical issues with patient which prohibited therapy. Pt with low hgb. To receive 2 units of blood today. Will check back another time.  Dyamon Sosinski A OTR/L [phone] 04/15/2012, 9:31 AM

## 2012-04-16 LAB — CBC
HCT: 29.5 % — ABNORMAL LOW (ref 36.0–46.0)
Hemoglobin: 9.8 g/dL — ABNORMAL LOW (ref 12.0–15.0)
MCH: 27 pg (ref 26.0–34.0)
MCV: 81.3 fL (ref 78.0–100.0)
RBC: 3.63 MIL/uL — ABNORMAL LOW (ref 3.87–5.11)

## 2012-04-16 LAB — TYPE AND SCREEN
Antibody Screen: NEGATIVE
Unit division: 0

## 2012-04-16 MED ORDER — WARFARIN SODIUM 6 MG PO TABS
6.0000 mg | ORAL_TABLET | Freq: Once | ORAL | Status: AC
  Administered 2012-04-16: 6 mg via ORAL
  Filled 2012-04-16: qty 1

## 2012-04-16 NOTE — Progress Notes (Signed)
04/16/12 1400  PT Visit Information  Last PT Received On 04/16/12  Assistance Needed +1 (+2 needed if increased amb distance)  PT Time Calculation  PT Start Time 1400  PT Stop Time 1431  PT Time Calculation (min) 31 min  Subjective Data  Subjective i am pretty comfortable  Precautions  Precautions Fall  Restrictions  LLE Weight Bearing PWB  LLE Partial Weight Bearing Percentage or Pounds 50%  Cognition  Overall Cognitive Status Appears within functional limits for tasks assessed/performed  Arousal/Alertness Awake/alert  Orientation Level Appears intact for tasks assessed  Behavior During Session [facility] for tasks performed  Bed Mobility  Bed Mobility Sit to Supine  Sit to Supine 3: Mod assist;HOB flat  Details for Bed Mobility Assistance cues for sequnce/technique; assist with LEs and positioning   Transfers  Transfers Sit to Stand;Stand to Sit  Sit to Stand 4: Min assist;3: Mod assist;From chair/3-in-1  Stand to Sit 3: Mod assist;4: Min assist;To bed;To chair/3-in-1  Details for Transfer Assistance multimodal cues for hand placement and safety and PWB.   Ambulation/Gait  Ambulation/Gait Assistance 4: Min assist;3: Mod assist  Ambulation Distance (Feet) 6 Feet  Assistive device Rolling walker  Ambulation/Gait Assistance Details multi modal cues for sequence, wt shift, PWB, posture;  Gait Pattern Step-to pattern;Decreased stance time - left;Trunk flexed  Total Joint Exercises  Heel Slides AAROM;Left;10 reps  Hip ABduction/ADduction Left;AAROM;10 reps  PT - End of Session  Equipment Utilized During Treatment Gait belt  Activity Tolerance Patient tolerated treatment well  Patient left in bed;with call bell/phone within reach;with family/visitor present;with nursing in room  PT - Assessment/Plan  Comments on Treatment Session progressing apppropriately  PT Plan Discharge plan remains appropriate;Frequency remains appropriate  PT Frequency Min 4X/week  Follow Up Recommendations  Skilled nursing facility  Equipment Recommended Defer to next venue  Acute Rehab PT Goals  Time For Goal Achievement 04/26/12  Potential to Achieve Goals Good  Pt will go Supine/Side to Sit with supervision  PT Goal: Supine/Side to Sit - Progress Progressing toward goal  Pt will go Sit to Stand with supervision  PT Goal: Sit to Stand - Progress Progressing toward goal  PT General Charges  $$ ACUTE PT VISIT 1 Procedure  PT Treatments  $Gait Training 8-22 mins  $Therapeutic Activity 8-22 mins

## 2012-04-16 NOTE — Progress Notes (Signed)
Physical Therapy Treatment Patient Details Name: Katie Silva MRN: [id number] DOB: [birth year] Today's Date: 04/16/2012 Time: 1012-1042 PT Time Calculation (min): 30 min  PT Assessment / Plan / Recommendation Comments on Treatment Session  progressing apppropriately    Follow Up Recommendations  Skilled nursing facility    Barriers to Discharge        Equipment Recommendations  Defer to next venue    Recommendations for Other Services    Frequency Min 4X/week   Plan Discharge plan remains appropriate;Frequency remains appropriate    Precautions / Restrictions Precautions Precautions: Fall Restrictions LLE Weight Bearing: Partial weight bearing LLE Partial Weight Bearing Percentage or Pounds: 50%   Pertinent Vitals/Pain     Mobility  Bed Mobility Bed Mobility: Not assessed Transfers Transfers: Sit to Stand;Stand to Sit Sit to Stand: 1: +2 Total assist Sit to Stand: Patient Percentage: 60% Stand to Sit: 1: +2 Total assist Stand to Sit: Patient Percentage: 70% Details for Transfer Assistance: multi modal cues for hand placement and safety and PWB Ambulation/Gait Ambulation/Gait Assistance: 1: +2 Total assist Ambulation/Gait: Patient Percentage: 70% Ambulation Distance (Feet): 15 Feet Assistive device: Rolling walker Ambulation/Gait Assistance Details: multi modal cues for sequence, wt shift, PWB, posture; Gait Pattern: Step-to pattern;Decreased stance time - left;Trunk flexed    Exercises Total Joint Exercises Ankle Circles/Pumps: AROM;Both;10 reps Quad Sets: AROM;Both;10 reps   PT Diagnosis:    PT Problem List:   PT Treatment Interventions:     PT Goals Acute Rehab PT Goals Time For Goal Achievement: 04/26/12 Potential to Achieve Goals: Good Pt will go Sit to Stand: with supervision PT Goal: Sit to Stand - Progress: Progressing toward goal Pt will Ambulate: 51 - 150 feet;with supervision;with rolling walker PT Goal: Ambulate - Progress: Progressing  toward goal  Visit Information  Last PT Received On: 04/16/12 Assistance Needed: +2    Subjective Data  Subjective: i was going to take a nap   Cognition  Overall Cognitive Status: Appears within functional limits for tasks assessed/performed Arousal/Alertness: Awake/alert Orientation Level: Appears intact for tasks assessed Behavior During Session: [facility] for tasks performed    Balance     End of Session PT - End of Session Equipment Utilized During Treatment: Gait belt Activity Tolerance: Patient tolerated treatment well Patient left: in chair;with call bell/phone within reach;with family/visitor present   GP     [facility] 04/16/2012, 10:48 AM

## 2012-04-16 NOTE — Progress Notes (Signed)
Subjective:  Patient very nice and talkative. C/O hip pain but appropriate.   Objective: Vital signs in last 24 hours: Temp:  [98 F (36.7 C)-99.5 F (37.5 C)] 98.2 F (36.8 C) (08/10 0650) Pulse Rate:  [71-85] 71  (08/10 0650) Resp:  [14-20] 14  (08/10 0650) BP: (134-175)/(62-79) 153/71 mmHg (08/10 0650) SpO2:  [93 %-97 %] 93 % (08/10 0650)  Intake/Output from previous day: 08/09 0701 - 08/10 0700 In: 865 [P.O.:840; Blood:25] Out: 1725 [Urine:1725] Intake/Output this shift:     Basename 04/16/12 0530 04/15/12 0401 04/14/12 0319 04/13/12 1902  HGB 9.8* 7.8* 10.1* 12.5    Basename 04/16/12 0530 04/15/12 0401  WBC 8.9 9.2  RBC 3.63* 2.95*  HCT 29.5* 23.6*  PLT 172 167    Basename 04/14/12 0319 04/13/12 1902  NA 137 141  K 4.2 4.0  CL 104 105  CO2 24 25  BUN 19 19  CREATININE 0.86 1.03  GLUCOSE 173* 96  CALCIUM 8.0* 9.1    Basename 04/16/12 0530 04/15/12 0401  LABPT -- --  INR 1.18 1.13    Incision: scant drainage  Healing well  DF and PF well  Assessment/Plan:  ortho stable no change in plan of care . SNF [facility]   Maili Shutters ANDREW 04/16/2012, 9:41 AM

## 2012-04-16 NOTE — Progress Notes (Signed)
ANTICOAGULATION CONSULT NOTE   Pharmacy Consult for warfarin Indication: VTE prophylaxis  Allergies  Allergen Reactions  . Fexofenadine Hcl Other (See Comments)    Doesn't agree with patient  . Penicillins     Patient Measurements: Height: 5\' 2"  (157.5 cm) Weight: 140 lb (63.504 kg) IBW/kg (Calculated) : 50.1   Labs:  Basename 04/16/12 0530 04/15/12 0401 04/14/12 1031 04/14/12 0319 04/13/12 1902  HGB 9.8* 7.8* -- -- --  HCT 29.5* 23.6* -- 31.3* --  PLT 172 167 -- 201 --  APTT -- -- -- -- 31  LABPROT 15.3* 14.7 -- 14.6 --  INR 1.18 1.13 -- 1.12 --  HEPARINUNFRC -- -- -- -- --  CREATININE -- -- -- 0.86 1.03  CKTOTAL -- -- -- -- --  CKMB -- -- -- -- --  TROPONINI -- -- <0.30 <0.30 --    Estimated Creatinine Clearance: 42.7 ml/min (by C-G formula based on Cr of 0.86).   Medical History: Past Medical History  Diagnosis Date  . Breast cancer, left breast 2003    s/p mastectomy, chemo.  Dr. Park Breed oncologist  . Fracture of humerus, proximal, right, closed 07/2010    s/p hemiarthroplasty  . Osteoporosis   . Cancer     Medications:  Scheduled:     . coumadin book  1 each Does not apply Once  . docusate sodium  100 mg Oral BID  . enoxaparin (LOVENOX) injection  40 mg Subcutaneous Q24H  . warfarin  5 mg Oral ONCE-1800  . Warfarin - Pharmacist Dosing Inpatient   Does not apply q1800    Assessment:  40 YOF s/p Left intertrochanteric hip fracture repair 8/7.  Warfarin and Lovenox for VTE prophylaxis ordered 8/8.  Post-op anemia, Hgb improved s/p 2 units PRBC on 8/9  INR remains subtherapeutic, but increasing slowly  Warfarin education completed 8/9  Goal of Therapy:  INR 2-3   Plan:   Warfarin 6 mg PO at 1800 x1  Continue Lovenox as ordered until INR 1.8  Daily PT/INR  Lynann Beaver PharmD, BCPS Pager [phone] 04/16/2012 1:03 PM

## 2012-04-16 NOTE — Evaluation (Signed)
Occupational Therapy Evaluation Patient Details Name: Katie Silva MRN: [id number] DOB: Jun 06, 1926 Today's Date: 04/16/2012 Time: 0930-1000 OT Time Calculation (min): 30 min  OT Assessment / Plan / Recommendation Clinical Impression  pt is s/p IM nail left femur and will benefit from OT to help her increase her independence with self care tasks.     OT Assessment  Patient needs continued OT Services    Follow Up Recommendations  Skilled nursing facility    Barriers to Discharge      Equipment Recommendations  Defer to next venue    Recommendations for Other Services    Frequency  Min 2X/week    Precautions / Restrictions Precautions Precautions: Fall Restrictions Weight Bearing Restrictions: Yes LLE Weight Bearing: Partial weight bearing LLE Partial Weight Bearing Percentage or Pounds: 50%        ADL  Eating/Feeding: Simulated;Independent Where Assessed - Eating/Feeding: Bed level Grooming: Simulated;Set up;Wash/dry hands Where Assessed - Grooming: Supine, head of bed up Upper Body Bathing: Simulated;Chest;Right arm;Left arm;Abdomen;Supervision/safety;Set up Where Assessed - Upper Body Bathing: Unsupported sitting Lower Body Bathing: Simulated;+2 Total assistance Lower Body Bathing: Patient Percentage: 40% Where Assessed - Lower Body Bathing: Supported sit to stand Upper Body Dressing: Simulated;Minimal assistance;Other (comment) (with gown) Where Assessed - Upper Body Dressing: Unsupported sitting Lower Body Dressing: +2 Total assistance Lower Body Dressing: Patient Percentage: 10% Where Assessed - Lower Body Dressing: Supported sit to stand Toilet Transfer: Simulated;+2 Total assistance Toilet Transfer: Patient Percentage: 60% Toilet Transfer Method: Stand pivot Toileting - Clothing Manipulation and Hygiene: Simulated;+2 Total assistance Toileting - Architect and Hygiene: Patient Percentage: 0% Where Assessed - Glass blower/designer Manipulation and  Hygiene: Standing Tub/Shower Transfer Method: Not assessed Equipment Used: Rolling walker ADL Comments: Pt needs cues for posture to stand as erect as possible although daughter states pt starting to hunch over more at home. Pt needed frequent cues for hand placement as she tends to grip too far forward on RW.    OT Diagnosis: Generalized weakness;Acute pain  OT Problem List: Decreased strength;Decreased range of motion;Decreased knowledge of use of DME or AE;Decreased knowledge of precautions OT Treatment Interventions: Self-care/ADL training;Therapeutic activities;DME and/or AE instruction;Patient/family education   OT Goals Acute Rehab OT Goals OT Goal Formulation: With patient/family Time For Goal Achievement: 04/23/12 Potential to Achieve Goals: Good ADL Goals Pt Will Perform Lower Body Bathing: with max assist;Sit to stand from chair;Sit to stand from bed;with adaptive equipment ADL Goal: Lower Body Bathing - Progress: Goal set today Pt Will Perform Lower Body Dressing: with max assist;Sit to stand from chair;Sit to stand from bed;with adaptive equipment ADL Goal: Lower Body Dressing - Progress: Goal set today Pt Will Transfer to Toilet: with mod assist;Stand pivot transfer;3-in-1;with DME ADL Goal: Toilet Transfer - Progress: Goal set today Pt Will Perform Toileting - Clothing Manipulation: Standing;with mod assist ADL Goal: Toileting - Clothing Manipulation - Progress: Goal set today Additional ADL Goal #1: Pt will transfer from supine to sit in prep for ADL with mod assist. ADL Goal: Additional Goal #1 - Progress: Goal set today  Visit Information  Last OT Received On: 04/16/12 Assistance Needed: +2    Subjective Data  Subjective: I want to get up and move Patient Stated Goal: to be independent   Prior Functioning  Vision/Perception  Home Living Lives With: Alone Available Help at Discharge: Available PRN/intermittently Type of Home: House Home Access: Stairs to  enter [employer] of Steps: 5 Entrance Stairs-Rails: Right Home Layout: One level Bathroom Shower/Tub: Tub/shower  unit Home Adaptive Equipment: Bedside commode/3-in-1 Additional Comments: doesn't use  Prior Function Level of Independence: Independent Able to Take Stairs?: Yes Communication Communication: No difficulties Dominant Hand: Right      Cognition  Overall Cognitive Status: Appears within functional limits for tasks assessed/performed Arousal/Alertness: Awake/alert Orientation Level: Appears intact for tasks assessed Behavior During Session: [facility] for tasks performed    Extremity/Trunk Assessment Right Upper Extremity Assessment RUE ROM/Strength/Tone: Deficits RUE ROM/Strength/Tone Deficits: AROM to approximately 45 degrees today and increase ROM with self assist.  Left Upper Extremity Assessment LUE ROM/Strength/Tone: [facility] for tasks assessed   Mobility Bed Mobility Bed Mobility: Supine to Sit Supine to Sit: 1: +2 Total assist;With rails;HOB elevated Supine to Sit: Patient Percentage: 50% Details for Bed Mobility Assistance: cues for hand placement and technique. Assist for trunk to upright and assist L LE over to EOB.  Transfers Transfers: Sit to Stand;Stand to Sit Sit to Stand: 1: +2 Total assist;From bed;With upper extremity assist;From elevated surface Sit to Stand: Patient Percentage: 60% Stand to Sit: 1: +2 Total assist;To chair/3-in-1;With upper extremity assist Stand to Sit: Patient Percentage: 60% Details for Transfer Assistance: multimodal cues for hand placement and safety and PWB.       Balance    End of Session OT - End of Session Equipment Utilized During Treatment: Gait belt Activity Tolerance: Patient tolerated treatment well Patient left: in chair;with call bell/phone within reach;with family/visitor present  GO     Lennox Laity [phone] 04/16/2012, 12:37 PM

## 2012-04-16 NOTE — Progress Notes (Signed)
[name] PROGRESS NOTE  Katie Silva WUJ:[id number] DOB: 1926-01-15 DOA: 04/13/2012 PCP: [name], MD  Assessment/Plan: 1. Left hip fracture/mechanical fall: Doing well status-post surgery 8/7. VTE prophylaxis, activity per orthopedics. SNF  8/11. 2. Post-operative anemia: Asymptomatic. Appropriate response to 2 units PRBC 8/9. Check CBC in AM. 3. SVT: Long-standing history of same. Patient well-acquainted with vagal maneuvers. Occurs at home in context of severe pain. TSH within normal limits.  4. History of left breast cancer  Code Status: Full Family Communication:  Disposition Plan: SNF 8/11  [name], MD  [name] Team 4 Pager [phone]. If 8PM-8AM, please contact night-coverage at [email or web address], password [facility] 04/16/2012, 1:02 PM  LOS: 3 days   Brief narrative: 78 year old woman suffered left hip fracture s/p mechanical fall. Brief episodes of SVT perioperatively.  Consultants:  Orthopedics  PT--SNF  OT  Procedures:  Troch Entry IM nail fixation of the left hip fracture.  HPI/Subjective: No complaints.  Objective: Filed Vitals:   04/15/12 1925 04/15/12 2043 04/16/12 0650 04/16/12 1000  BP: 152/71 175/72 153/71 125/77  Pulse: 77 74 71 92  Temp: 98.5 F (36.9 C) 98.6 F (37 C) 98.2 F (36.8 C) 98.2 F (36.8 C)  TempSrc: Oral  Oral Oral  Resp: 16 16 14 16   Height:      Weight:      SpO2:  96% 93% 93%    Intake/Output Summary (Last 24 hours) at 04/16/12 1302 Last data filed at 04/16/12 1000  Gross per 24 hour  Intake  612.5 ml  Output   1475 ml  Net -862.5 ml   Wt Readings from Last 3 Encounters:  04/14/12 63.504 kg (140 lb)  04/14/12 63.504 kg (140 lb)  03/31/12 64.456 kg (142 lb 1.6 oz)    Exam:   General:  Appears well. Talkative.  Cardiovascular: RRR, no m/r/g. No LE edema.  Respiratory: CTA bilaterally, no w/r/r. Normal respiratory effort.  Data Reviewed: Basic Metabolic Panel:  Lab 04/14/12 8657  04/13/12 1902  NA 137 141  K 4.2 4.0  CL 104 105  CO2 24 25  GLUCOSE 173* 96  BUN 19 19  CREATININE 0.86 1.03  CALCIUM 8.0* 9.1  MG -- --  PHOS -- --   CBC:  Lab 04/16/12 0530 04/15/12 0401 04/14/12 0319 04/13/12 1902  WBC 8.9 9.2 14.5* 8.3  NEUTROABS -- -- -- 4.4  HGB 9.8* 7.8* 10.1* 12.5  HCT 29.5* 23.6* 31.3* 38.4  MCV 81.3 80.0 80.3 80.7  PLT 172 167 201 260   Cardiac Enzymes:  Lab 04/14/12 1031 04/14/12 0319  CKTOTAL -- --  CKMB -- --  CKMBINDEX -- --  TROPONINI <0.30 <0.30   Recent Results (from the past 240 hour(s))  MRSA PCR SCREENING     Status: Normal   Collection Time   04/14/12  2:15 AM      Component Value Range Status Comment   MRSA by PCR NEGATIVE  NEGATIVE Final     Studies:  Scheduled Meds:    . coumadin book  1 each Does not apply Once  . docusate sodium  100 mg Oral BID  . enoxaparin (LOVENOX) injection  40 mg Subcutaneous Q24H  . warfarin  5 mg Oral ONCE-1800  . Warfarin - Pharmacist Dosing Inpatient   Does not apply q1800   Continuous Infusions:   Principal Problem:  *Closed left hip fracture Active Problems:  Fall  Postoperative anemia  SVT (supraventricular tachycardia)     [name], MD  [facility] Team 4 Pager [phone]. If 8PM-8AM, please contact night-coverage at [email or web address], password [facility] 04/16/2012, 1:02 PM  LOS: 3 days   Time spent: 10 minutes

## 2012-04-17 LAB — CBC
HCT: 30.6 % — ABNORMAL LOW (ref 36.0–46.0)
Hemoglobin: 10.8 g/dL — ABNORMAL LOW (ref 12.0–15.0)
WBC: 9.4 10*3/uL (ref 4.0–10.5)

## 2012-04-17 LAB — PROTIME-INR
INR: 1.15 (ref 0.00–1.49)
Prothrombin Time: 14.9 seconds (ref 11.6–15.2)

## 2012-04-17 MED ORDER — WARFARIN SODIUM 7.5 MG PO TABS
7.5000 mg | ORAL_TABLET | Freq: Once | ORAL | Status: AC
  Administered 2012-04-17: 7.5 mg via ORAL
  Filled 2012-04-17: qty 1

## 2012-04-17 NOTE — Progress Notes (Signed)
Physical Therapy Treatment Patient Details Name: Katie Silva MRN: [id number] DOB: Jun 27, 1926 Today's Date: 04/17/2012 Time: 4098-1191 PT Time Calculation (min): 27 min  PT Assessment / Plan / Recommendation Comments on Treatment Session  progressing apppropriately    Follow Up Recommendations  Skilled nursing facility    Barriers to Discharge        Equipment Recommendations  Defer to next venue    Recommendations for Other Services    Frequency Min 4X/week   Plan Discharge plan remains appropriate;Frequency remains appropriate    Precautions / Restrictions Precautions Precautions: Fall Restrictions LLE Weight Bearing: Partial weight bearing LLE Partial Weight Bearing Percentage or Pounds: 50%   Pertinent Vitals/Pain Some SOB with amb, resolved quickly with rest    Mobility  Bed Mobility Bed Mobility: Sit to Supine Supine to Sit: 4: Min assist;HOB flat Details for Bed Mobility Assistance: cues for sequnce/technique; assist with LEs and positioning  Transfers Transfers: Sit to Stand;Stand to Sit Sit to Stand: 4: Min assist;From bed;From chair/3-in-1 Stand to Sit: 4: Min assist;To chair/3-in-1 Stand Pivot Transfers: 4: Min assist Details for Transfer Assistance: multimodal cues for hand placement and safety and PWB.  Ambulation/Gait Ambulation/Gait Assistance: 4: Min assist Ambulation Distance (Feet): 25 Feet (10') Assistive device: Rolling walker Ambulation/Gait Assistance Details: multi modal cues for sequence, wt shift, PWB, posture; Gait Pattern: Step-to pattern;Decreased stance time - left;Trunk flexed    Exercises     PT Diagnosis:    PT Problem List:   PT Treatment Interventions:     PT Goals Acute Rehab PT Goals Time For Goal Achievement: 04/26/12 Potential to Achieve Goals: Good Pt will go Supine/Side to Sit: with supervision PT Goal: Supine/Side to Sit - Progress: Progressing toward goal Pt will go Sit to Stand: with supervision PT Goal: Sit  to Stand - Progress: Progressing toward goal Pt will Ambulate: 51 - 150 feet;with supervision;with rolling walker PT Goal: Ambulate - Progress: Progressing toward goal  Visit Information  Last PT Received On: 04/17/12 Assistance Needed: +1 (+2 safety and chair)    Subjective Data  Subjective: my bottom is sore   Cognition  Overall Cognitive Status: Appears within functional limits for tasks assessed/performed Arousal/Alertness: Awake/alert Orientation Level: Appears intact for tasks assessed Behavior During Session: [facility] for tasks performed    Balance     End of Session PT - End of Session Equipment Utilized During Treatment: Gait belt Activity Tolerance: Patient tolerated treatment well Patient left: in chair;with call bell/phone within reach;with family/visitor present Nurse Communication: Patient requests pain meds   GP     [facility] 04/17/2012, 11:16 AM

## 2012-04-17 NOTE — Progress Notes (Signed)
   Subjective: 4 Days Post-Op Procedure(s) (LRB): INTRAMEDULLARY (IM) NAIL FEMORAL (Left)   Patient reports pain as mild, pain is well controlled. A few issues over the night with spasms, but doing well this morning.   Objective:   VITALS:   Filed Vitals:   04/17/12 0445  BP: 144/68  Pulse: 67  Temp: 98.7 F (37.1 C)  Resp: 20    Neurovascular intact Dorsiflexion/Plantar flexion intact Incision: dressing C/D/I No cellulitis present Compartment soft  LABS  Basename 04/17/12 0435 04/16/12 0530 04/15/12 0401  HGB 10.8* 9.8* 7.8*  HCT 30.6* 29.5* 23.6*  WBC 9.4 8.9 9.2  PLT 177 172 167    Assessment/Plan: 4 Days Post-Op Procedure(s) (LRB): INTRAMEDULLARY (IM) NAIL FEMORAL (Left)   Up with therapy Ortho stable no change in plan of care. Daily dressing changes with guaze and tape Plan is eventual discharge to SNF, possibly Monday   Anastasio Auerbach. Neema Barreira   PAC  04/17/2012, 8:19 AM

## 2012-04-17 NOTE — Progress Notes (Signed)
04/17/12 1300  PT Visit Information  Last PT Received On 04/17/12  Assistance Needed +1  PT Time Calculation  PT Start Time 1210  PT Stop Time 1247  PT Time Calculation (min) 37 min  Subjective Data  Subjective I was waiting for you  Precautions  Precautions Fall  Restrictions  LLE Weight Bearing PWB  LLE Partial Weight Bearing Percentage or Pounds 50%  Cognition  Overall Cognitive Status Appears within functional limits for tasks assessed/performed  Arousal/Alertness Awake/alert  Orientation Level Appears intact for tasks assessed  Behavior During Session [facility] for tasks performed  Transfers  Transfers Sit to Stand;Stand to Sit  Sit to Stand 4: Min guard;With upper extremity assist;With armrests  Stand to Sit 4: Min guard;To chair/3-in-1  Details for Transfer Assistance multimodal cues for hand placement and safety and PWB.   Ambulation/Gait  Ambulation/Gait Assistance 4: Min guard  Ambulation Distance (Feet) 40 Feet  Assistive device Rolling walker  Ambulation/Gait Assistance Details multi modal cues for sequence, wt shift, PWB, posture;  Gait Pattern Step-to pattern;Decreased stance time - left;Trunk flexed  Total Joint Exercises  Ankle Circles/Pumps AROM;Both;10 reps  Quad Sets AROM;Both;10 reps  Heel Slides AAROM;Left;10 reps  Hip ABduction/ADduction Left;AAROM;10 reps  PT - End of Session  Activity Tolerance Patient tolerated treatment well  Patient left in chair;with call bell/phone within reach;with family/visitor present  PT - Assessment/Plan  Comments on Treatment Session progressing well; feel sbetter overall today  PT Plan Discharge plan remains appropriate;Frequency remains appropriate  PT Frequency Min 4X/week  Follow Up Recommendations Skilled nursing facility  Equipment Recommended Defer to next venue  Acute Rehab PT Goals  Time For Goal Achievement 04/26/12  Potential to Achieve Goals Good  Pt will go Sit to Stand with supervision  PT Goal: Sit to  Stand - Progress Progressing toward goal  Pt will Ambulate 51 - 150 feet;with supervision;with rolling walker  PT Goal: Ambulate - Progress Progressing toward goal  PT General Charges  $$ ACUTE PT VISIT 1 Procedure  PT Treatments  $Gait Training 8-22 mins  $Therapeutic Exercise 8-22 mins

## 2012-04-17 NOTE — Progress Notes (Signed)
ANTICOAGULATION CONSULT NOTE   Pharmacy Consult for warfarin Indication: VTE prophylaxis  Allergies  Allergen Reactions  . Fexofenadine Hcl Other (See Comments)    Doesn't agree with patient  . Penicillins     Patient Measurements: Height: 5\' 2"  (157.5 cm) Weight: 140 lb (63.504 kg) IBW/kg (Calculated) : 50.1   Labs:  Basename 04/17/12 0435 04/16/12 0530 04/15/12 0401 04/14/12 1031  HGB 10.8* 9.8* -- --  HCT 30.6* 29.5* 23.6* --  PLT 177 172 167 --  APTT -- -- -- --  LABPROT 14.9 15.3* 14.7 --  INR 1.15 1.18 1.13 --  HEPARINUNFRC -- -- -- --  CREATININE -- -- -- --  CKTOTAL -- -- -- --  CKMB -- -- -- --  TROPONINI -- -- -- <0.30    Estimated Creatinine Clearance: 42.7 ml/min (by C-G formula based on Cr of 0.86).   Medications:  Scheduled:     . coumadin book  1 each Does not apply Once  . docusate sodium  100 mg Oral BID  . enoxaparin (LOVENOX) injection  40 mg Subcutaneous Q24H  . warfarin  6 mg Oral ONCE-1800  . Warfarin - Pharmacist Dosing Inpatient   Does not apply q1800    Assessment:  83 YOF s/p Left intertrochanteric hip fracture repair 8/7.  Warfarin and Lovenox for VTE prophylaxis ordered 8/8.  Post-op anemia, Hgb improved s/p 2 units PRBC on 8/9  INR remains subtherapeutic, despite increased dose 8/10  Warfarin education completed 8/9  Goal of Therapy:  INR 2-3   Plan:   Warfarin 7.5 mg PO at 1800 x1  Continue Lovenox as ordered until INR 1.8  Daily PT/INR  Lynann Beaver PharmD, BCPS Pager [phone] 04/17/2012 9:59 AM

## 2012-04-17 NOTE — Progress Notes (Signed)
[name] PROGRESS NOTE  Katie Silva AVW:[id number] DOB: 09/14/1925 DOA: 04/13/2012 PCP: [name], MD  Assessment/Plan: 1. Left hip fracture/mechanical fall: Doing well status-post surgery 8/7. VTE prophylaxis, activity per orthopedics. SNF 8/12. 2. Post-operative anemia: Stable with appropriate response to 2 units PRBC 8/9.  3. SVT: Long-standing history of same. Patient well-acquainted with vagal maneuvers. Occurs at home in context of severe pain. TSH within normal limits.  4. History of left breast cancer  Code Status: Full Family Communication: Discussed with daughter at bedside Disposition Plan: SNF 8/12  [name], MD  [name] Team 4 Pager [phone]. If 8PM-8AM, please contact night-coverage at [email or web address], password [facility] 04/17/2012, 11:45 AM  LOS: 4 days   Brief narrative: 78 year old woman suffered left hip fracture s/p mechanical fall. Brief episodes of SVT perioperatively.  Consultants:  Orthopedics  PT--SNF  OT  Procedures:  Troch Entry IM nail fixation of the left hip fracture.  HPI/Subjective: No complaints. Doing well.  Objective: Filed Vitals:   04/16/12 0650 04/16/12 1445 04/16/12 2130 04/17/12 0445  BP: 153/71 118/73 172/78 144/68  Pulse: 71 74 75 67  Temp: 98.2 F (36.8 C) 98.2 F (36.8 C) 98.4 F (36.9 C) 98.7 F (37.1 C)  TempSrc: Oral Oral Oral Oral  Resp: 14 16 16 20   Height:      Weight:      SpO2: 93% 97% 95% 92%    Intake/Output Summary (Last 24 hours) at 04/17/12 1145 Last data filed at 04/17/12 0445  Gross per 24 hour  Intake      0 ml  Output   1300 ml  Net  -1300 ml   Wt Readings from Last 3 Encounters:  04/14/12 63.504 kg (140 lb)  04/14/12 63.504 kg (140 lb)  03/31/12 64.456 kg (142 lb 1.6 oz)    Exam:   General:  Appears well.   Cardiovascular: RRR, no m/r/g. No LE edema.  Respiratory: CTA bilaterally, no w/r/r. Normal respiratory effort.  Data Reviewed: Basic Metabolic  Panel:  Lab 04/14/12 0319 04/13/12 1902  NA 137 141  K 4.2 4.0  CL 104 105  CO2 24 25  GLUCOSE 173* 96  BUN 19 19  CREATININE 0.86 1.03  CALCIUM 8.0* 9.1  MG -- --  PHOS -- --   CBC:  Lab 04/17/12 0435 04/16/12 0530 04/15/12 0401 04/14/12 0319 04/13/12 1902  WBC 9.4 8.9 9.2 14.5* 8.3  NEUTROABS -- -- -- -- 4.4  HGB 10.8* 9.8* 7.8* 10.1* 12.5  HCT 30.6* 29.5* 23.6* 31.3* 38.4  MCV 80.7 81.3 80.0 80.3 80.7  PLT 177 172 167 201 260   Cardiac Enzymes:  Lab 04/14/12 1031 04/14/12 0319  CKTOTAL -- --  CKMB -- --  CKMBINDEX -- --  TROPONINI <0.30 <0.30   Recent Results (from the past 240 hour(s))  MRSA PCR SCREENING     Status: Normal   Collection Time   04/14/12  2:15 AM      Component Value Range Status Comment   MRSA by PCR NEGATIVE  NEGATIVE Final     Studies:  Scheduled Meds:    . coumadin book  1 each Does not apply Once  . docusate sodium  100 mg Oral BID  . enoxaparin (LOVENOX) injection  40 mg Subcutaneous Q24H  . warfarin  6 mg Oral ONCE-1800  . warfarin  7.5 mg Oral ONCE-1800  . Warfarin - Pharmacist Dosing Inpatient   Does not apply q1800   Continuous Infusions:   Principal Problem:  *  Closed left hip fracture Active Problems:  Fall  Postoperative anemia  SVT (supraventricular tachycardia)     [name], MD  [name] Team 4 Pager [phone]. If 8PM-8AM, please contact night-coverage at [email or web address], password [facility] 04/17/2012, 11:45 AM  LOS: 4 days   Time spent: 10 minutes

## 2012-04-18 LAB — PROTIME-INR
INR: 1.35 (ref 0.00–1.49)
Prothrombin Time: 16.9 seconds — ABNORMAL HIGH (ref 11.6–15.2)

## 2012-04-18 MED ORDER — FLEET ENEMA 7-19 GM/118ML RE ENEM
1.0000 | ENEMA | Freq: Once | RECTAL | Status: AC
  Administered 2012-04-18: 11:00:00 via RECTAL
  Filled 2012-04-18: qty 1

## 2012-04-18 MED ORDER — HYDROCODONE-ACETAMINOPHEN 5-325 MG PO TABS: 1.0000 | ORAL_TABLET | Freq: Four times a day (QID) | ORAL | Status: AC | PRN

## 2012-04-18 MED ORDER — METHOCARBAMOL 500 MG PO TABS: 500.0000 mg | ORAL_TABLET | Freq: Two times a day (BID) | ORAL | Status: AC | PRN

## 2012-04-18 MED ORDER — WARFARIN SODIUM 5 MG PO TABS: 5.0000 mg | ORAL_TABLET | Freq: Every day | ORAL | Status: DC

## 2012-04-18 MED ORDER — DSS 100 MG PO CAPS: 100.0000 mg | ORAL_CAPSULE | Freq: Two times a day (BID) | ORAL | Status: AC

## 2012-04-18 NOTE — Discharge Summary (Addendum)
Physician Discharge Summary  Katie Silva QIO:[id number] DOB: June 13, 1926 DOA: 04/13/2012  PCP: [name], MD Orthopedic surgeon: Venita Lick, MD  Admit date: 04/13/2012 Discharge date: 04/18/2012  Recommendations for Outpatient Follow-up:  1. VTE prophylaxis post-hip surgery with warfarin.  2. PT/OT at SNF post-hip surgery  Follow-up Information    Follow up with [name], MD in 3 weeks.   Contact information:   [address], [place] [address] [phone]       Follow up with Alvy Beal, MD. Call in 1 day. (to arrange follow-up appointment)    Contact information:   [facility] [address], [address] [place] [address] [phone]         Discharge Diagnoses:  1. Left hip fracture secondary to mechanical fall 2. Post-operative anemia (acute blood loss intraoperatively) 3. SVT  Discharge Condition: improved Disposition: SNF for short-term rehabiliation  Diet recommendation: regular  Filed Weights   04/14/12 0220  Weight: 63.504 kg (140 lb)   History of present illness:  78 year old woman suffered left hip fracture s/p mechanical fall. Brief episodes of SVT perioperatively.  Hospital Course:  Katie Silva underwent succesful hip fracture repair surgery. Perioperatively she had brief episodes of SVT. Her course has been otherwise uncomplicated except for post-operative anemia which is now stable s/p transfusion. She will be transferred to SNF for rehab. 1. Left hip fracture/mechanical fall: Doing well status-post surgery 8/7. VTE prophylaxis, activity per orthopedics. SNF today.  2. Post-operative anemia: Stable with appropriate response to 2 units PRBC 8/9.    3. SVT: Long-standing history of same. Patient well-acquainted with vagal maneuvers. Occurs at home in context of severe pain. TSH within normal limits.    4. History of left breast  cancer  Consultants:  Orthopedics   PT--SNF   OT  Procedures:  8/7 Troch Entry IM nail fixation of the left hip fracture.  8/9 transfusion 2 units PRBC  Discharge Instructions  Discharge Orders    Future Appointments: Provider: Department: Dept Phone: Center:   04/03/2013 10:00 AM Windell Hummingbird [facility] [phone] None   04/03/2013 10:30 AM Victorino December, MD [facility] [phone] None     Future Orders Please Complete By Expires   Diet - low sodium heart healthy      Diet general      Call MD / Call 911      Comments:   If you experience chest pain or shortness of breath, CALL 911 and be transported to the hospital emergency room.  If you develope a fever above 101 F, pus (white drainage) or increased drainage or redness at the wound, or calf pain, call your surgeon's office.   Constipation Prevention      Comments:   Drink plenty of fluids.  Prune juice may be helpful.  You may use a stool softener, such as Colace (over the counter) 100 mg twice a day.  Use MiraLax (over the counter) for constipation as needed.   Increase activity slowly as tolerated      Scheduling Instructions:   WB as taught during therapy   Discharge instructions      Comments:   Keep incision clean and dry.  Leave steri strips in place.  May shower 5 days from surgery; pat to dry following shower.  May redress with clean, dry dressing if you would like.  Do not apply any lotion/cream/ointment to the incision.   Increase activity slowly  Medication List  As of 04/18/2012 10:04 AM   STOP taking these medications         aspirin 81 MG tablet         TAKE these medications         alendronate 70 MG tablet   Commonly known as: FOSAMAX   Take 70 mg by mouth every 7 (seven) days. Take with a full glass of water on an empty stomach every Friday.      APPLE CIDER VINEGAR DIET PO   Take 4 oz by mouth daily as needed. As needed for pain. Mix 16 oz grape juice,  4 oz apple  cider vinegar, 16 oz apple juice, and honey to taste.  Drink 4 ounces daily.      CALTRATE 600+D 600-400 MG-UNIT per tablet   Generic drug: Calcium Carbonate-Vitamin D   Take 1 tablet by mouth 2 (two) times daily.      DSS 100 MG Caps   Take 100 mg by mouth 2 (two) times daily.      glucosamine-chondroitin 500-400 MG tablet   Take 2 tablets by mouth daily.      HYDROcodone-acetaminophen 5-325 MG per tablet   Commonly known as: NORCO/VICODIN   Take 1 tablet by mouth every 6 (six) hours as needed.      L-Lysine 500 MG Caps   Take 1 capsule by mouth daily.      methocarbamol 500 MG tablet   Commonly known as: ROBAXIN   Take 1 tablet (500 mg total) by mouth 2 (two) times daily as needed.      vitamin B-12 100 MCG tablet   Commonly known as: CYANOCOBALAMIN   Take 50 mcg by mouth daily.      vitamin C 500 MG tablet   Commonly known as: ASCORBIC ACID   Take 500 mg by mouth daily.      vitamin E 400 UNIT capsule   Take 400 Units by mouth daily.      warfarin 5 MG tablet   Commonly known as: COUMADIN   Take 1 tablet (5 mg total) by mouth daily.           The results of significant diagnostics from this hospitalization (including imaging, microbiology, ancillary and laboratory) are listed below for reference.    Significant Diagnostic Studies: Dg Shoulder Right  04/13/2012  *RADIOLOGY REPORT*  Clinical Data: Fall, right shoulder pain  RIGHT SHOULDER - 2+ VIEW  Comparison: 07/26/2010  Findings: Four views of the right shoulder submitted. No acute fracture is noted.  Again noted right humeral prosthesis.  There is high-riding right humerus suspicious for superior subluxation from the glenohumeral joint. Rotator cuff injury cannot be excluded. Stable probable post surgical resection of distal right clavicle.  IMPRESSION: No acute fracture is noted.  Again noted right humeral prosthesis. There is high-riding right humerus suspicious for superior subluxation from the glenohumeral joint.  Rotator cuff injury cannot be excluded.  Stable probable post surgical resection of distal right clavicle.  Original Report Authenticated By: Natasha Mead, M.D.   Dg Hip Complete Left  04/13/2012  *RADIOLOGY REPORT*  Clinical Data: Fall, hip pain  LEFT HIP - COMPLETE 2+ VIEW  Comparison: None.  Findings: Four views of the left hip submitted.  There is displaced intertrochanteric fracture of the proximal left femur.  Diffuse osteopenia is noted.  IMPRESSION: Displaced fracture of proximal left femur.  Diffuse osteopenia.  Per CMS PQRS reporting requirements (PQRS Measure 24): Given the patient's age of  greater than 50 and the fracture site (hip, distal radius, or spine), the patient should be tested for osteoporosis using DXA, and the appropriate treatment considered based on the DXA results.  Original Report Authenticated By: Natasha Mead, M.D.   Dg Femur Left  04/13/2012  *RADIOLOGY REPORT*  Clinical Data: Left hip intertrochanteric fracture, operative fixation  LEFT FEMUR - 2 VIEW  Comparison: 04/13/2012  Findings: Left hip IM rod and screw fixation through the intertrochanteric fracture with improved alignment.  Fracture line remains visible medially.  Single distal fixation pin for the IM rod.  IMPRESSION: Status post left femur IM rod and screw fixation for a the left hip intertrochanteric fracture.  Improved alignment.  Original Report Authenticated By: Judie Petit. Ruel Favors, M.D.   Ct Head Wo Contrast  04/13/2012  *RADIOLOGY REPORT*  Clinical Data: Hypertension, fall  CT HEAD WITHOUT CONTRAST  Technique:  Contiguous axial images were obtained from the base of the skull through the vertex without contrast.  Comparison: 10/08/2009  Findings: Age-related brain atrophy and white matter microvascular ischemic changes.  No acute intracranial hemorrhage, definite fraction, mass lesion, midline shift, herniation, hydrocephalus, or extra-axial fluid collection.  Cisterns patent.  No cerebellar abnormality.  Mastoid sinuses  clear.  Calvarial thickening noted.  IMPRESSION: Stable exam.  No acute intracranial finding  Original Report Authenticated By: Judie Petit. Ruel Favors, M.D.   Microbiology: Recent Results (from the past 240 hour(s))  MRSA PCR SCREENING     Status: Normal   Collection Time   04/14/12  2:15 AM      Component Value Range Status Comment   MRSA by PCR NEGATIVE  NEGATIVE Final     Labs: Basic Metabolic Panel:  Lab 04/14/12 1610 04/13/12 1902  NA 137 141  K 4.2 4.0  CL 104 105  CO2 24 25  GLUCOSE 173* 96  BUN 19 19  CREATININE 0.86 1.03  CALCIUM 8.0* 9.1  MG -- --  PHOS -- --   CBC:  Lab 04/17/12 0435 04/16/12 0530 04/15/12 0401 04/14/12 0319 04/13/12 1902  WBC 9.4 8.9 9.2 14.5* 8.3  NEUTROABS -- -- -- -- 4.4  HGB 10.8* 9.8* 7.8* 10.1* 12.5  HCT 30.6* 29.5* 23.6* 31.3* 38.4  MCV 80.7 81.3 80.0 80.3 80.7  PLT 177 172 167 201 260   Cardiac Enzymes:  Lab 04/14/12 1031 04/14/12 0319  CKTOTAL -- --  CKMB -- --  CKMBINDEX -- --  TROPONINI <0.30 <0.30   Principal Problem:  *Closed left hip fracture Active Problems:  Fall  Postoperative anemia  SVT (supraventricular tachycardia)   Time coordinating discharge: 25 minutes  Signed:  [name], MD [name] 04/18/2012, 10:04 AM

## 2012-04-18 NOTE — Progress Notes (Signed)
[name] PROGRESS NOTE  Katie Silva JYN:[id number] DOB: 1926-06-24 DOA: 04/13/2012 PCP: [name], MD  Assessment/Plan: 1. Left hip fracture/mechanical fall: Doing well status-post surgery 8/7. VTE prophylaxis, activity per orthopedics. SNF today. 2. Post-operative anemia: Stable with appropriate response to 2 units PRBC 8/9.  3. SVT: Long-standing history of same. Patient well-acquainted with vagal maneuvers. Occurs at home in context of severe pain. TSH within normal limits.  4. History of left breast cancer  Code Status: Full Family Communication: Discussed with daughters at bedside 8/12 Disposition Plan: SNF today  [name], MD  [name] Team 4 Pager [phone]. If 8PM-8AM, please contact night-coverage at [email or web address], password [facility] 04/18/2012, 9:57 AM  LOS: 5 days   Brief narrative: 78 year old woman suffered left hip fracture s/p mechanical fall. Brief episodes of SVT perioperatively.  Consultants:  Orthopedics  PT--SNF  OT  Procedures:  8/12 Troch Entry IM nail fixation of the left hip fracture.  HPI/Subjective: Constipated, otherwise doing well.  Objective: Filed Vitals:   04/17/12 2300 04/18/12 0451 04/18/12 0633 04/18/12 0800  BP: 177/76 188/76 169/78   Pulse:  66    Temp:  98.9 F (37.2 C)    TempSrc:  Oral    Resp:  18  18  Height:      Weight:      SpO2:  95%  95%    Intake/Output Summary (Last 24 hours) at 04/18/12 0957 Last data filed at 04/18/12 9629  Gross per 24 hour  Intake    420 ml  Output    700 ml  Net   -280 ml   Wt Readings from Last 3 Encounters:  04/14/12 63.504 kg (140 lb)  04/14/12 63.504 kg (140 lb)  03/31/12 64.456 kg (142 lb 1.6 oz)    Exam:   General:  Appears well.   Cardiovascular: RRR, no m/r/g.   Respiratory: CTA bilaterally, no w/r/r. Normal respiratory effort.  Psychiatric: Grossly normal mood and affect, speech fluent and appropriate.  Data Reviewed: Basic Metabolic  Panel:  Lab 04/14/12 0319 04/13/12 1902  NA 137 141  K 4.2 4.0  CL 104 105  CO2 24 25  GLUCOSE 173* 96  BUN 19 19  CREATININE 0.86 1.03  CALCIUM 8.0* 9.1  MG -- --  PHOS -- --   CBC:  Lab 04/17/12 0435 04/16/12 0530 04/15/12 0401 04/14/12 0319 04/13/12 1902  WBC 9.4 8.9 9.2 14.5* 8.3  NEUTROABS -- -- -- -- 4.4  HGB 10.8* 9.8* 7.8* 10.1* 12.5  HCT 30.6* 29.5* 23.6* 31.3* 38.4  MCV 80.7 81.3 80.0 80.3 80.7  PLT 177 172 167 201 260   Cardiac Enzymes:  Lab 04/14/12 1031 04/14/12 0319  CKTOTAL -- --  CKMB -- --  CKMBINDEX -- --  TROPONINI <0.30 <0.30   Recent Results (from the past 240 hour(s))  MRSA PCR SCREENING     Status: Normal   Collection Time   04/14/12  2:15 AM      Component Value Range Status Comment   MRSA by PCR NEGATIVE  NEGATIVE Final     Studies:  Scheduled Meds:    . coumadin book  1 each Does not apply Once  . docusate sodium  100 mg Oral BID  . enoxaparin (LOVENOX) injection  40 mg Subcutaneous Q24H  . warfarin  7.5 mg Oral ONCE-1800  . Warfarin - Pharmacist Dosing Inpatient   Does not apply q1800   Continuous Infusions:   Principal Problem:  *Closed left hip fracture Active Problems:  Fall  Postoperative anemia  SVT (supraventricular tachycardia)     [name], MD  [name] Team 4 Pager [phone]. If 8PM-8AM, please contact night-coverage at [email or web address], password [facility] 04/18/2012, 9:57 AM  LOS: 5 days

## 2012-04-18 NOTE — Progress Notes (Signed)
Pt for d/c to SNF( [facility] today. IV d/c'd. Dressing changed to L hip/outer thigh. D/C instructions discussed with pt & dau with verbalized understanding. Awaitng for transport to SNF.

## 2012-04-18 NOTE — Progress Notes (Addendum)
ANTICOAGULATION CONSULT NOTE   Pharmacy Consult for warfarin Indication: VTE prophylaxis  Allergies  Allergen Reactions  . Fexofenadine Hcl Other (See Comments)    Doesn't agree with patient  . Penicillins     Patient Measurements: Height: 5\' 2"  (157.5 cm) Weight: 140 lb (63.504 kg) IBW/kg (Calculated) : 50.1   Labs:  Basename 04/18/12 0414 04/17/12 0435 04/16/12 0530  HGB -- 10.8* 9.8*  HCT -- 30.6* 29.5*  PLT -- 177 172  APTT -- -- --  LABPROT 16.9* 14.9 15.3*  INR 1.35 1.15 1.18  HEPARINUNFRC -- -- --  CREATININE -- -- --  CKTOTAL -- -- --  CKMB -- -- --  TROPONINI -- -- --    Estimated Creatinine Clearance: 42.7 ml/min (by C-G formula based on Cr of 0.86).   Medications:  Scheduled:     . coumadin book  1 each Does not apply Once  . docusate sodium  100 mg Oral BID  . enoxaparin (LOVENOX) injection  40 mg Subcutaneous Q24H  . sodium phosphate  1 enema Rectal Once  . warfarin  7.5 mg Oral ONCE-1800  . Warfarin - Pharmacist Dosing Inpatient   Does not apply q1800   Warfarin doses administered 8/8 - 8/11:  5, 5, 6, 7.5mg    Assessment:  86 YOF s/p Left intertrochanteric hip fracture repair 8/7.  Warfarin and Lovenox for VTE prophylaxis ordered 8/8.  Post-op anemia, Hgb improved s/p 2 units PRBC on 8/9  INR rising slowly.  So far, patient is requiring larger warfarin doses than typical for age, gender, and weight.  Warfarin education completed 8/9  Goal of Therapy:  INR 2-3   Plan:   Agree with warfarin discharge dosage of 5mg  PO daily.  Continue Lovenox 40mg  SQ q24h as ordered: until INR 1.8  Needs close INR follow-up at SNF.  Suggest checking INR tomorrow, then at least three times a week until therapeutic and stable, then at least twice a week.  Hope Budds, PharmD, BCPS Pager: [phone] 04/18/2012 10:56 AM

## 2012-04-18 NOTE — Progress Notes (Signed)
    Subjective: 5 Days Post-Op Procedure(s) (LRB): INTRAMEDULLARY (IM) NAIL FEMORAL (Left) Patient reports pain as 4 on 0-10 scale.   Denies CP or SOB.  Voiding without difficulty. Positive flatus. Negative BM. Patient was able to demonstrate out of bed to the bedside commode with walker.  Objective: Vital signs in last 24 hours: Temp:  [98.9 F (37.2 C)-99.4 F (37.4 C)] 98.9 F (37.2 C) (08/12 0451) Pulse Rate:  [66-69] 66  (08/12 0451) Resp:  [18-20] 18  (08/12 0800) BP: (147-188)/(76-104) 169/78 mmHg (08/12 0633) SpO2:  [95 %-97 %] 95 % (08/12 0800)  Intake/Output from previous day: 08/11 0701 - 08/12 0700 In: 300 [P.O.:300] Out: 500 [Urine:500] Intake/Output this shift: Total I/O In: 120 [P.O.:120] Out: 200 [Urine:200]  Labs:  [facility] 04/17/12 0435 04/16/12 0530  HGB 10.8* 9.8*    Basename 04/17/12 0435 04/16/12 0530  WBC 9.4 8.9  RBC 3.79* 3.63*  HCT 30.6* 29.5*  PLT 177 172   No results found for this basename: NA:2,K:2,CL:2,CO2:2,BUN:2,CREATININE:2,GLUCOSE:2,CALCIUM:2 in the last 72 hours  Basename 04/18/12 0414 04/17/12 0435  LABPT -- --  INR 1.35 1.15    Physical Exam: Neurologically intact ABD soft Neurovascular intact Dorsiflexion/Plantar flexion intact Incision: dressing C/D/I Compartment soft  Assessment/Plan: 5 Days Post-Op Procedure(s) (LRB): INTRAMEDULLARY (IM) NAIL FEMORAL (Left) Discharge to SNF Doing well overall.   Fleets enema and Mag citrate for constipation Continue care in the meantime.   Gwinda Maine for Dr. Venita Lick [facility] [phone] 04/18/2012, 11:08 AM

## 2012-04-18 NOTE — Progress Notes (Signed)
CSW assisting with d/c planning. [employer] has provided prior approval for SNF placement. [facility] ( [facility] ) is able to accept pt today if stable for D/C. CSW will follow to assist with d/c planning to SNF. PT/Family have been updated.  Cori Razor LCSW [phone]

## 2012-04-19 NOTE — Progress Notes (Signed)
Clinical Social Work Department CLINICAL SOCIAL WORK PLACEMENT NOTE 04/19/2012  Patient:  Katie Silva, Katie Silva  Account Number:  [id number] Admit date:  04/13/2012  Clinical Social Worker:  Cori Razor, LCSW  Date/time:  04/14/2012 05:02 PM  Clinical Social Work is seeking post-discharge placement for this patient at the following level of care:   SKILLED NURSING   (*CSW will update this form in Epic as items are completed)   04/14/2012  Patient/family provided with [facility] Department of Clinical Social Work's list of facilities offering this level of care within the geographic area requested by the patient (or if unable, by the patient's family).  04/14/2012  Patient/family informed of their freedom to choose among providers that offer the needed level of care, that participate in Medicare, Medicaid or managed care program needed by the patient, have an available bed and are willing to accept the patient.    Patient/family informed of [facility]' ownership interest in [facility], as well as of the fact that they are under no obligation to receive care at this facility.  PASARR submitted to EDS on 04/14/2012 PASARR number received from EDS on 04/14/2012  FL2 transmitted to all facilities in geographic area requested by pt/family on  04/14/2012 FL2 transmitted to all facilities within larger geographic area on   Patient informed that his/her managed care company has contracts with or will negotiate with  certain facilities, including the following:     Patient/family informed of bed offers received:  04/14/2012 Patient chooses bed at [facility] [place] Physician recommends and patient chooses bed at    [facility] be [facility] [place] on  04/18/2012 Patient to be transferred to facility by [facility]  The following physician request were entered in Epic:   Additional Comments:  Cori Razor LCSW [phone]

## 2012-05-12 ENCOUNTER — Ambulatory Visit: Payer: Medicare PPO | Admitting: Family Medicine

## 2012-06-15 ENCOUNTER — Telehealth: Payer: Self-pay

## 2012-06-15 NOTE — Telephone Encounter (Signed)
Katie Silva took BP this AM BP was 186/90. Katie Silva was upset when BP taken due to phone call. Rechecked at 1pm by Katie Silva son BP 188/90. Katie Silva daughter states BP usually 185/76. Katie Silva has no complaint of h/a,dizziness, SOB or chest pain.Katie Silva has appt scheduled 06/17/12; Katie Silva cannot come 06/16/12 due to another family member having surgery. Katie Silva does not want to see another doctor except Dr Dayton Martes.[facility] [address]Please advise.

## 2012-06-15 NOTE — Telephone Encounter (Signed)
Patient has appt on Friday  

## 2012-06-15 NOTE — Telephone Encounter (Signed)
Ok if she is not willing to start any medication, then let's discuss when I see her this week.  Hopefully, we can convince her to start medication if she does indeed have high blood pressure.

## 2012-06-15 NOTE — Telephone Encounter (Signed)
Is she taking anything for high blood pressure?

## 2012-06-15 NOTE — Telephone Encounter (Signed)
Spoke with patient's daughter. Pt is not on any blood pressure medicine at this time, refused it back in April.

## 2012-06-17 ENCOUNTER — Encounter: Payer: Self-pay | Admitting: *Deleted

## 2012-06-17 ENCOUNTER — Encounter: Payer: Self-pay | Admitting: Family Medicine

## 2012-06-17 ENCOUNTER — Ambulatory Visit (INDEPENDENT_AMBULATORY_CARE_PROVIDER_SITE_OTHER): Payer: Medicare PPO | Admitting: Family Medicine

## 2012-06-17 VITALS — BP 160/84 | HR 70 | Temp 97.5°F | Resp 20 | Ht 62.0 in | Wt 141.5 lb

## 2012-06-17 DIAGNOSIS — I10 Essential (primary) hypertension: Secondary | ICD-10-CM

## 2012-06-17 DIAGNOSIS — M810 Age-related osteoporosis without current pathological fracture: Secondary | ICD-10-CM

## 2012-06-17 DIAGNOSIS — D649 Anemia, unspecified: Secondary | ICD-10-CM

## 2012-06-17 DIAGNOSIS — S72002A Fracture of unspecified part of neck of left femur, initial encounter for closed fracture: Secondary | ICD-10-CM

## 2012-06-17 DIAGNOSIS — I498 Other specified cardiac arrhythmias: Secondary | ICD-10-CM

## 2012-06-17 DIAGNOSIS — I471 Supraventricular tachycardia: Secondary | ICD-10-CM

## 2012-06-17 DIAGNOSIS — S72009A Fracture of unspecified part of neck of unspecified femur, initial encounter for closed fracture: Secondary | ICD-10-CM

## 2012-06-17 DIAGNOSIS — W19XXXA Unspecified fall, initial encounter: Secondary | ICD-10-CM

## 2012-06-17 DIAGNOSIS — R300 Dysuria: Secondary | ICD-10-CM

## 2012-06-17 LAB — CBC WITH DIFFERENTIAL/PLATELET
Basophils Absolute: 0 10*3/uL (ref 0.0–0.1)
Eosinophils Relative: 0.6 % (ref 0.0–5.0)
HCT: 41.5 % (ref 36.0–46.0)
Hemoglobin: 13.1 g/dL (ref 12.0–15.0)
Lymphocytes Relative: 34.6 % (ref 12.0–46.0)
Lymphs Abs: 2.8 10*3/uL (ref 0.7–4.0)
Monocytes Relative: 8.9 % (ref 3.0–12.0)
Neutro Abs: 4.5 10*3/uL (ref 1.4–7.7)
Platelets: 263 10*3/uL (ref 150.0–400.0)
WBC: 8.1 10*3/uL (ref 4.5–10.5)

## 2012-06-17 LAB — BASIC METABOLIC PANEL
CO2: 29 mEq/L (ref 19–32)
Calcium: 9.3 mg/dL (ref 8.4–10.5)
Chloride: 103 mEq/L (ref 96–112)
Glucose, Bld: 106 mg/dL — ABNORMAL HIGH (ref 70–99)
Sodium: 140 mEq/L (ref 135–145)

## 2012-06-17 LAB — POCT URINALYSIS DIPSTICK
Blood, UA: NEGATIVE
Glucose, UA: NEGATIVE
Ketones, UA: NEGATIVE
Spec Grav, UA: 1.02
Urobilinogen, UA: NEGATIVE

## 2012-06-17 MED ORDER — LISINOPRIL 10 MG PO TABS: 10.0000 mg | ORAL_TABLET | Freq: Every day | ORAL | Status: DC

## 2012-06-17 NOTE — Patient Instructions (Addendum)
Great to see you. We are starting Lisinopril 10 mg daily. Please call me in 1-2 weeks with your blood pressure readings.  We will call you with your lab results.

## 2012-06-17 NOTE — Progress Notes (Signed)
78 yo very pleasant female here for hospital follow up Here with her daughter today.  Notes reviewed.  Admitted to [facility] 8/7- 04/19/2012 s/p fall which resulted in left hip fracture requiring surgical repair (Troch Entry IM nail fixation of the left hip fracture).  She did have brief episodes of SVT perioperatively felt due to be secondary to pain.   She also did have some post operative anemia, requiring 2 Units of PRBCs.  Lab Results  Component Value Date   WBC 9.4 04/17/2012   HGB 10.8* 04/17/2012   HCT 30.6* 04/17/2012   MCV 80.7 04/17/2012   PLT 177 04/17/2012   Was discharged to SNF ([facility]) for PT/OT x 4 weeks, was also on coumadin post operatively at SNF. Was discharged home with home PT/OT and progressed very well- last session was this past Monday.  She is now walking with cane just for balance. Independent for all ADLs except bathing- her daughter wants to make sure she is supervised during bathing so she does not trip getting out. They do have a walk in shower but it has a rather large lip.  HTN- BP has been elevated for past few months.  When I saw her in 07/2011 and 09/2011, it was ranging in 140s asystolically.  Her daughter reports that when home health had been checking it, it was ranging in 160s- 190s systollic. She denies any HA, blurred vision, CP or SOB.  She is under significant family stress lately but has an extremely supportive daughter and son.  Her mood is good. BP Readings from Last 3 Encounters:  04/18/12 169/78  04/18/12 169/78  03/31/12 192/82      H/o left breast CA- s/p mastectomy and chemotherapy. Followed by Dr. Park Breed. Had neg mammogram in 11/2010.   Patient Active Problem List  Diagnosis  . Breast cancer, left breast  . Fracture of humerus, proximal, right, closed  . Osteoporosis  . HTN (hypertension)  . Closed left hip fracture  . Acute pain  . Fall  . Postoperative anemia  . SVT (supraventricular tachycardia)   Past Medical History    Diagnosis Date  . Breast cancer, left breast 2003    s/p mastectomy, chemo.  Dr. Park Breed oncologist  . Fracture of humerus, proximal, right, closed 07/2010    s/p hemiarthroplasty  . Osteoporosis   . Cancer    Past Surgical History  Procedure Date  . Shoulder hemi-arthroplasty 07/2010    Dr. Dion Saucier  . Abdominal hysterectomy   . Knee arthroscopy     BIlateral knees at [facility]  . Femur im nail 04/13/2012    Procedure: INTRAMEDULLARY (IM) NAIL FEMORAL;  Surgeon: Venita Lick, MD;  Location: [facility];  Service: Orthopedics;  Laterality: Left;   History  Substance Use Topics  . Smoking status: Never Smoker   . Smokeless tobacco: Never Used  . Alcohol Use: No   Family History  Problem Relation Age of Onset  . Myasthenia gravis Mother   . Coronary artery disease Father    Allergies  Allergen Reactions  . Fexofenadine Hcl Other (See Comments)    Doesn't agree with patient  . Penicillins    Current Outpatient Prescriptions on File Prior to Visit  Medication Sig Dispense Refill  . alendronate (FOSAMAX) 70 MG tablet Take 70 mg by mouth every 7 (seven) days. Take with a full glass of water on an empty stomach every Friday.      . Calcium Carbonate-Vitamin D (CALTRATE 600+D) 600-400 MG-UNIT per tablet Take 1  tablet by mouth 2 (two) times daily.       [name] glucosamine-chondroitin 500-400 MG tablet Take 2 tablets by mouth daily.       [name] L-Lysine 500 MG CAPS Take 1 capsule by mouth daily.        . Misc Natural Products (APPLE CIDER VINEGAR DIET PO) Take 4 oz by mouth daily as needed. As needed for pain. Mix 16 oz grape juice,  4 oz apple cider vinegar, 16 oz apple juice, and honey to taste.  Drink 4 ounces daily.      . vitamin B-12 (CYANOCOBALAMIN) 100 MCG tablet Take 50 mcg by mouth daily.        . vitamin C (ASCORBIC ACID) 500 MG tablet Take 500 mg by mouth daily.        . vitamin E 400 UNIT capsule Take 400 Units by mouth daily.      [name] warfarin (COUMADIN) 5 MG tablet Take 1 tablet (5 mg total)  by mouth daily.  30 tablet  0    The PMH, PSH, Social History, Family History, Medications, and allergies have been reviewed in [facility], and have been updated if relevant.  ROS: See HPI   Physical exam: BP 160/84  Pulse 70  Temp 97.5 F (36.4 C) (Oral)  Resp 20  Ht 5\' 2"  (1.575 m)  Wt 141 lb 8 oz (64.184 kg)  BMI 25.88 kg/m2  SpO2 98%  Gen:  Alert, very pleasant, appears younger than stated age. HEENT:  Supple, nontender, no adenopathy Dentures appear to fit well Resp:  CTA bilaterally CVS:  RRR, no MRG Ext:  No edema.  Decreased ROM of right shoulder and left hip, no pain.   Assessment and Plan:  1. Postoperative anemia  S/p 2 units PRBCs. Recheck CBC today. CBC with Differential  2. Closed left hip fracture  S/p repair- no pain. Progressed well with therapy.   3. HTN (hypertension)  Deteriorated, multiple elevated readings. Will start Lisinopril 10 mg daily. Pt's daughter will call me with BP readings over the next week or two. Check Cr and electrolytes today. The patient indicates understanding of these issues and agrees with the plan.  Basic Metabolic Panel

## 2012-06-17 NOTE — Addendum Note (Signed)
Addended by: Eliezer Bottom on: 06/17/2012 02:14 PM   Modules accepted: Orders

## 2012-09-23 ENCOUNTER — Other Ambulatory Visit: Payer: Self-pay | Admitting: Oncology

## 2012-11-14 ENCOUNTER — Encounter: Payer: Self-pay | Admitting: Family Medicine

## 2013-02-13 ENCOUNTER — Telehealth: Payer: Self-pay | Admitting: Radiology

## 2013-02-13 ENCOUNTER — Inpatient Hospital Stay (HOSPITAL_COMMUNITY)
Admission: EM | Admit: 2013-02-13 | Discharge: 2013-02-15 | DRG: 379 | Disposition: A | Discharge status: Home / Self Care | Payer: Medicare PPO | Attending: Internal Medicine | Admitting: Internal Medicine

## 2013-02-13 ENCOUNTER — Ambulatory Visit (INDEPENDENT_AMBULATORY_CARE_PROVIDER_SITE_OTHER): Payer: Medicare PPO | Admitting: Family Medicine

## 2013-02-13 ENCOUNTER — Encounter (HOSPITAL_COMMUNITY): Payer: Self-pay | Admitting: Emergency Medicine

## 2013-02-13 ENCOUNTER — Encounter: Payer: Self-pay | Admitting: Family Medicine

## 2013-02-13 VITALS — BP 122/72 | HR 70 | Temp 97.8°F | Wt 137.0 lb

## 2013-02-13 DIAGNOSIS — K921 Melena: Secondary | ICD-10-CM

## 2013-02-13 DIAGNOSIS — D500 Iron deficiency anemia secondary to blood loss (chronic): Secondary | ICD-10-CM | POA: Diagnosis present

## 2013-02-13 DIAGNOSIS — M810 Age-related osteoporosis without current pathological fracture: Secondary | ICD-10-CM

## 2013-02-13 DIAGNOSIS — T3995XA Adverse effect of unspecified nonopioid analgesic, antipyretic and antirheumatic, initial encounter: Secondary | ICD-10-CM | POA: Diagnosis present

## 2013-02-13 DIAGNOSIS — I10 Essential (primary) hypertension: Secondary | ICD-10-CM

## 2013-02-13 DIAGNOSIS — I471 Supraventricular tachycardia, unspecified: Secondary | ICD-10-CM

## 2013-02-13 DIAGNOSIS — R195 Other fecal abnormalities: Secondary | ICD-10-CM

## 2013-02-13 DIAGNOSIS — A048 Other specified bacterial intestinal infections: Secondary | ICD-10-CM | POA: Diagnosis present

## 2013-02-13 DIAGNOSIS — Z853 Personal history of malignant neoplasm of breast: Secondary | ICD-10-CM

## 2013-02-13 DIAGNOSIS — K2981 Duodenitis with bleeding: Secondary | ICD-10-CM

## 2013-02-13 DIAGNOSIS — Z880 Allergy status to penicillin: Secondary | ICD-10-CM

## 2013-02-13 DIAGNOSIS — R0602 Shortness of breath: Secondary | ICD-10-CM

## 2013-02-13 DIAGNOSIS — D649 Anemia, unspecified: Secondary | ICD-10-CM

## 2013-02-13 DIAGNOSIS — K922 Gastrointestinal hemorrhage, unspecified: Secondary | ICD-10-CM | POA: Diagnosis present

## 2013-02-13 DIAGNOSIS — C50912 Malignant neoplasm of unspecified site of left female breast: Secondary | ICD-10-CM

## 2013-02-13 DIAGNOSIS — K269 Duodenal ulcer, unspecified as acute or chronic, without hemorrhage or perforation: Secondary | ICD-10-CM | POA: Diagnosis present

## 2013-02-13 DIAGNOSIS — K254 Chronic or unspecified gastric ulcer with hemorrhage: Principal | ICD-10-CM | POA: Diagnosis present

## 2013-02-13 DIAGNOSIS — R52 Pain, unspecified: Secondary | ICD-10-CM

## 2013-02-13 DIAGNOSIS — T50995A Adverse effect of other drugs, medicaments and biological substances, initial encounter: Secondary | ICD-10-CM | POA: Diagnosis present

## 2013-02-13 HISTORY — DX: Unspecified osteoarthritis, unspecified site: M19.90

## 2013-02-13 HISTORY — DX: Chronic or unspecified gastric ulcer with hemorrhage: K25.4

## 2013-02-13 HISTORY — DX: Essential (primary) hypertension: I10

## 2013-02-13 HISTORY — DX: Duodenitis with bleeding: K29.81

## 2013-02-13 HISTORY — DX: Anemia, unspecified: D64.9

## 2013-02-13 LAB — COMPREHENSIVE METABOLIC PANEL
ALT: 12 U/L (ref 0–35)
AST: 19 U/L (ref 0–37)
Albumin: 3.6 g/dL (ref 3.5–5.2)
Alkaline Phosphatase: 51 U/L (ref 39–117)
BUN: 19 mg/dL (ref 6–23)
CO2: 25 mEq/L (ref 19–32)
Calcium: 9 mg/dL (ref 8.4–10.5)
Chloride: 104 mEq/L (ref 96–112)
Creatinine, Ser: 1.01 mg/dL (ref 0.50–1.10)
GFR calc Af Amer: 57 mL/min — ABNORMAL LOW (ref >90)
GFR calc non Af Amer: 49 mL/min — ABNORMAL LOW (ref >90)
Glucose, Bld: 92 mg/dL (ref 70–99)
Potassium: 3.7 mEq/L (ref 3.5–5.1)
Sodium: 137 mEq/L (ref 135–145)
Total Bilirubin: 0.2 mg/dL — ABNORMAL LOW (ref 0.3–1.2)
Total Protein: 6.2 g/dL (ref 6.0–8.3)

## 2013-02-13 LAB — CBC
HCT: 24.7 % — ABNORMAL LOW (ref 36.0–46.0)
Hemoglobin: 7.7 g/dL — ABNORMAL LOW (ref 12.0–15.0)
MCH: 22.2 pg — ABNORMAL LOW (ref 26.0–34.0)
MCHC: 31.2 g/dL (ref 30.0–36.0)
MCV: 71.2 fL — ABNORMAL LOW (ref 78.0–100.0)
Platelets: 305 10*3/uL (ref 150–400)
RBC: 3.47 MIL/uL — ABNORMAL LOW (ref 3.87–5.11)
RDW: 28.3 % — ABNORMAL HIGH (ref 11.5–15.5)
WBC: 8.4 10*3/uL (ref 4.0–10.5)

## 2013-02-13 LAB — CBC WITH DIFFERENTIAL/PLATELET
Eosinophils Absolute: 0 10*3/uL (ref 0.0–0.7)
Eosinophils Relative: 0.4 % (ref 0.0–5.0)
HCT: 24.7 % — ABNORMAL LOW (ref 36.0–46.0)
Lymphs Abs: 2 10*3/uL (ref 0.7–4.0)
MCHC: 30.7 g/dL (ref 30.0–36.0)
MCV: 73.1 fl — ABNORMAL LOW (ref 78.0–100.0)
Monocytes Absolute: 0.7 10*3/uL (ref 0.1–1.0)
Platelets: 337 10*3/uL (ref 150.0–400.0)
WBC: 8.1 10*3/uL (ref 4.5–10.5)

## 2013-02-13 LAB — OCCULT BLOOD, POC DEVICE: Fecal Occult Bld: POSITIVE — AB

## 2013-02-13 MED ORDER — SODIUM CHLORIDE 0.9 % IV SOLN: 80.0000 mg | INTRAVENOUS | Status: DC

## 2013-02-13 MED ORDER — SODIUM CHLORIDE 0.9 % IJ SOLN
3.0000 mL | Freq: Two times a day (BID) | INTRAMUSCULAR | Status: DC
  Administered 2013-02-14 – 2013-02-15 (×3): 3 mL via INTRAVENOUS

## 2013-02-13 MED ORDER — SODIUM CHLORIDE 0.9 % IV SOLN
8.0000 mg/h | INTRAVENOUS | Status: DC
  Administered 2013-02-14 (×2): 8 mg/h via INTRAVENOUS
  Filled 2013-02-13 (×4): qty 80

## 2013-02-13 MED ORDER — PANTOPRAZOLE SODIUM 40 MG IV SOLR
40.0000 mg | Freq: Once | INTRAVENOUS | Status: AC
  Administered 2013-02-13: 40 mg via INTRAVENOUS
  Filled 2013-02-13: qty 40

## 2013-02-13 MED ORDER — PANTOPRAZOLE SODIUM 40 MG IV SOLR: 40.0000 mg | INTRAVENOUS | Status: DC

## 2013-02-13 MED ORDER — LISINOPRIL 10 MG PO TABS
10.0000 mg | ORAL_TABLET | Freq: Every day | ORAL | Status: DC
  Administered 2013-02-14 – 2013-02-15 (×3): 10 mg via ORAL
  Filled 2013-02-13 (×4): qty 1

## 2013-02-13 NOTE — H&P (Signed)
[name] History and Physical  Katie Silva WGN:[id number] DOB: [birth year] DOA: 02/13/2013  Referring physician: ED PCP: [name], MD  Specialists: None  Chief Complaint: SOB  HPI: Katie Silva is a 79 y.o. female who presented to her PCP earlier today with c/o chronic SOB and DOE.  She has apparently had this progressively worsening recently.  She has also had dark stools for the past 6 months she estimates.  Stools are occasionally loose but typically normal consistancy to constipation.  No vomiting, no abdominal pain, does have decreased appetitie.  Does admit to taking alleve AND ibuprofen daily for "aches, pains, and headaches".  At her PCPs office guiac testing was positive and the patients HGB was found to be 7.6, she was sent to the ED for further evaluation.  In the ED guiac was again positive and HGB 7.7.  She is not really acutely symptomatic so she was not transfused blood, hospitalist has been asked to admit for GI bleed.  Review of Systems: 12 systems reviewed and otherwise negative.  Past Medical History  Diagnosis Date  . Breast cancer, left breast 2003    s/p mastectomy, chemo.  Dr. Park Breed oncologist  . Fracture of humerus, proximal, right, closed 07/2010    s/p hemiarthroplasty  . Osteoporosis   . Cancer   . Hypertension    Past Surgical History  Procedure Laterality Date  . Shoulder hemi-arthroplasty  07/2010    Dr. Dion Saucier  . Abdominal hysterectomy    . Knee arthroscopy      [facility] knees [facility]  . Femur im nail  04/13/2012    Procedure: INTRAMEDULLARY (IM) NAIL FEMORAL;  Surgeon: Venita Lick, MD;  Location: [facility];  Service: Orthopedics;  Laterality: Left;   Social History:  reports that she has never smoked. She has never used smokeless tobacco. She reports that she does not drink alcohol or use illicit drugs.   Allergies  Allergen Reactions  . Fexofenadine Hcl Other (See Comments)    Doesn't agree with patient  . Penicillins      Family History  Problem Relation Age of Onset  . Myasthenia gravis Mother   . Coronary artery disease Father   . Colon cancer Sister     Prior to Admission medications   Medication Sig Start Date End Date Taking? Authorizing Provider  aspirin EC 81 MG tablet Take 81 mg by mouth daily.   Yes Historical Provider, MD  Calcium Carbonate-Vitamin D (CALTRATE 600+D) 600-400 MG-UNIT per tablet Take 1 tablet by mouth 2 (two) times daily.    Yes Historical Provider, MD  glucosamine-chondroitin 500-400 MG tablet Take 2 tablets by mouth daily.  10/05/98  Yes Historical Provider, MD  L-Lysine 500 MG CAPS Take 1 capsule by mouth daily.     Yes Historical Provider, MD  lisinopril (PRINIVIL,ZESTRIL) 10 MG tablet Take 1 tablet (10 mg total) by mouth daily. 06/17/12  Yes Dianne Dun, MD  Misc Natural Products (APPLE CIDER VINEGAR DIET PO) Take 4 oz by mouth daily as needed. As needed for pain. Mix 16 oz grape juice,  4 oz apple cider vinegar, 16 oz apple juice, and honey to taste.  Drink 4 ounces daily.   Yes Historical Provider, MD  vitamin B-12 (CYANOCOBALAMIN) 100 MCG tablet Take 50 mcg by mouth daily.     Yes Historical Provider, MD  vitamin C (ASCORBIC ACID) 500 MG tablet Take 500 mg by mouth daily.     Yes Historical Provider, MD  vitamin  E 400 UNIT capsule Take 400 Units by mouth daily.   Yes Historical Provider, MD  alendronate (FOSAMAX) 70 MG tablet TAKE 1 TABLET BY MOUTH ONCE WEEKLY 09/23/12   Victorino December, MD   Physical Exam: Filed Vitals:   02/13/13 1823 02/13/13 2006 02/13/13 2007 02/13/13 2009  BP: 136/65 155/66 166/55 156/61  Pulse: 66 64 74 76  Temp: 98.5 F (36.9 C)     TempSrc: Oral     Resp: 18     SpO2: 100%       General:  NAD, resting comfortably in bed Eyes: PEERLA EOMI ENT: mucous membranes moist Neck: supple w/o JVD Cardiovascular: RRR w/o MRG Respiratory: CTA B Abdomen: soft, nt, nd, bs+ Skin: no rash nor lesion Musculoskeletal: MAE, full ROM all 4  extremities Psychiatric: normal tone and affect Neurologic: AAOx3, grossly non-focal  Labs on Admission:  Basic Metabolic Panel:  Recent Labs Lab 02/13/13 1905  NA 137  K 3.7  CL 104  CO2 25  GLUCOSE 92  BUN 19  CREATININE 1.01  CALCIUM 9.0   Liver Function Tests:  Recent Labs Lab 02/13/13 1905  AST 19  ALT 12  ALKPHOS 51  BILITOT 0.2*  PROT 6.2  ALBUMIN 3.6   No results found for this basename: LIPASE, AMYLASE,  in the last 168 hours No results found for this basename: AMMONIA,  in the last 168 hours CBC:  Recent Labs Lab 02/13/13 1323 02/13/13 1905  WBC 8.1 8.4  NEUTROABS 5.3  --   HGB 7.6 Repeated and verified X2.* 7.7*  HCT 24.7 Repeated and verified X2.* 24.7*  MCV 73.1* 71.2*  PLT 337.0 305   Cardiac Enzymes: No results found for this basename: CKTOTAL, CKMB, CKMBINDEX, TROPONINI,  in the last 168 hours  BNP (last 3 results) No results found for this basename: PROBNP,  in the last 8760 hours CBG: No results found for this basename: GLUCAP,  in the last 168 hours  Radiological Exams on Admission: No results found.  EKG: Independently reviewed.  Assessment/Plan Principal Problem:   GI bleed Active Problems:   Blood loss anemia   1. GI bleed - likely NSAID induced given that she is taking 2 different NSAIDS on a daily basis but cannot r/o neoplasm of course (especially given the family history with her sister and the fact that the patient hasnt had a colonoscopy since the 1980s).  Needs GI consult and likely EGD / colonoscopy, got 40 mg protonix in ED IV, reordering to make it 80 mg total for this evening then putting patient on full dose 80 mg q24h of the PPI for concern of NSAID induced ulcer.  No NSAIDS for now.  Patient is hemodynamically stable and it sounds like a very chronic bleed but putting on tele monitor anyhow just incase the bleed is more acute than initially believed. 2. Blood loss anemia - holding off on transfusing PRBC for now  given that she is presently asymptomatic, will of course transfuse if indicated.    Code Status: Full Code (must indicate code status--if unknown or must be presumed, indicate so) Family Communication: Spoke with daughter at bedside (indicate person spoken with, if applicable, with phone number if by telephone) Disposition Plan: Admit to obs (indicate anticipated LOS)  Time spent: 70 min  Matthews Franks M. [name] Pager [phone]  If 7PM-7AM, please contact night-coverage [email or web address] Password [facility] 02/13/2013, 9:48 PM

## 2013-02-13 NOTE — Progress Notes (Signed)
79 yo very pleasant female here with daughter for several concerns.   Black stools- noticed that stools have been black stools for past 6 months.  Sometimes loose but typically normal consistency.  No BRB in stool.  Some mild nausea, no vomiting. Appetite decreased.  Has noticed some increased fatigue.  She does admit to taking Alleve AND Ibuprofen daily for "aches, pains and headaches." Wt Readings from Last 3 Encounters:  02/13/13 137 lb (62.143 kg)  06/17/12 141 lb 8 oz (64.184 kg)  04/14/12 140 lb (63.504 kg)     Lab Results  Component Value Date   WBC 8.1 06/17/2012   HGB 13.1 06/17/2012   HCT 41.5 06/17/2012   MCV 86.2 06/17/2012   PLT 263.0 06/17/2012      H/o left breast CA- s/p mastectomy and chemotherapy. Followed by Dr. Park Breed but was advised that she did not need to follow up. Had neg mammogram in 11/2010.  Has been feeling more short of breath. Thinks this is due to her allergies.  Not taking anything regularly.  Patient Active Problem List   Diagnosis Date Noted  . Black stools 02/13/2013  . Postoperative anemia 04/14/2012  . SVT (supraventricular tachycardia) 04/14/2012  . Closed left hip fracture 04/13/2012  . Acute pain 04/13/2012  . Fall 04/13/2012  . HTN (hypertension) 01/30/2011  . Breast cancer, left breast   . Osteoporosis   . Fracture of humerus, proximal, right, closed 07/08/2010   Past Medical History  Diagnosis Date  . Breast cancer, left breast 2003    s/p mastectomy, chemo.  Dr. Park Breed oncologist  . Fracture of humerus, proximal, right, closed 07/2010    s/p hemiarthroplasty  . Osteoporosis   . Cancer    Past Surgical History  Procedure Laterality Date  . Shoulder hemi-arthroplasty  07/2010    Dr. Dion Saucier  . Abdominal hysterectomy    . Knee arthroscopy      [facility]lateral knees at [facility]  . Femur im nail  04/13/2012    Procedure: INTRAMEDULLARY (IM) NAIL FEMORAL;  Surgeon: Venita Lick, MD;  Location: [facility];  Service: Orthopedics;  Laterality:  Left;   History  Substance Use Topics  . Smoking status: Never Smoker   . Smokeless tobacco: Never Used  . Alcohol Use: No   Family History  Problem Relation Age of Onset  . Myasthenia gravis Mother   . Coronary artery disease Father    Allergies  Allergen Reactions  . Fexofenadine Hcl Other (See Comments)    Doesn't agree with patient  . Penicillins    Current Outpatient Prescriptions on File Prior to Visit  Medication Sig Dispense Refill  . alendronate (FOSAMAX) 70 MG tablet TAKE 1 TABLET BY MOUTH ONCE WEEKLY  4 tablet  0  . aspirin EC 81 MG tablet Take 81 mg by mouth daily.      . Calcium Carbonate-Vitamin D (CALTRATE 600+D) 600-400 MG-UNIT per tablet Take 1 tablet by mouth 2 (two) times daily.       [name] glucosamine-chondroitin 500-400 MG tablet Take 2 tablets by mouth daily.       [name] L-Lysine 500 MG CAPS Take 1 capsule by mouth daily.        [name] lisinopril (PRINIVIL,ZESTRIL) 10 MG tablet Take 1 tablet (10 mg total) by mouth daily.  90 tablet  3  . Misc Natural Products (APPLE CIDER VINEGAR DIET PO) Take 4 oz by mouth daily as needed. As needed for pain. Mix 16 oz grape juice,  4 oz apple  cider vinegar, 16 oz apple juice, and honey to taste.  Drink 4 ounces daily.      . vitamin B-12 (CYANOCOBALAMIN) 100 MCG tablet Take 50 mcg by mouth daily.        . vitamin C (ASCORBIC ACID) 500 MG tablet Take 500 mg by mouth daily.        . vitamin E 400 UNIT capsule Take 400 Units by mouth daily.      . phenylephrine (SUDAFED PE) 10 MG TABS Take 10 mg by mouth every 4 (four) hours as needed.       No current facility-administered medications on file prior to visit.    The PMH, PSH, Social History, Family History, Medications, and allergies have been reviewed in [facility], and have been updated if relevant.  ROS: See HPI   Physical exam: BP 122/72  Pulse 70  Temp(Src) 97.8 F (36.6 C)  Wt 137 lb (62.143 kg)  BMI 25.05 kg/m2  SpO2 96% Wt Readings from Last 3 Encounters:  02/13/13 137 lb  (62.143 kg)  06/17/12 141 lb 8 oz (64.184 kg)  04/14/12 140 lb (63.504 kg)   Gen:  Alert, very pleasant, appears fatigued today. HEENT:  Supple, nontender, no adenopathy Dentures appear to fit well Resp:  CTA bilaterally CVS:  RRR, no MRG Ext:  No edema.  Decreased ROM of right shoulder and left hip, no pain. Rectal: No external deformity, no palpable masses or fissures, +guiac positive, black stool  Assessment and Plan: 1. Black stools Probable UGI bleed vs malignancy. Discussed this with pt and her daughter.  They do not want GI work up/referral at this time. Advised to STOP all NSAIDs and ASA products. Start Nexium 40 mg nightly- samples given. Check CBC - CBC with Differential  2. SOB (shortness of breath) Lungs clear.  Will rule out anemia- see above.

## 2013-02-13 NOTE — Telephone Encounter (Signed)
Per Dr Dayton Martes, advised pt's daughter, Freddi Starr, that pt needs to go to ER now for evaluation of GI bleed.  Daughter said she will take patient to [facility] long ER.  Pt hesitant to go, but said she would.

## 2013-02-13 NOTE — ED Provider Notes (Signed)
History     CSN: [id number]  Arrival date & time 02/13/13  1742   First MD Initiated Contact with Patient 02/13/13 1915      Chief Complaint  Patient presents with  . Abnormal Labs     . low hemoglobin   HPI  History provided by the patient. Patient is an 79 year old female with history of hypertension, former breast cancer and osteoporosis who presents with concerns for abnormal low hemoglobin level. Patient was seen at her PCP office today because she was having some increased shortness of breath with activity. She states that she thought maybe she had bronchitis or something like that. Denies any significant coughing. No fever, chills or sweats. During evaluation at the office she was found to be Hemoccult positive and later her hemoglobin resulted at 7.6. She was telephoned at home and told to come to the emergency room. She denies any significant complaints at this time. She does mention noticing some dark black-colored stools for the past several weeks to months. She also states some issues with constipation. Denies any abdominal pains. Denies any gross red blood. Denies any urinary symptoms.    Past Medical History  Diagnosis Date  . Breast cancer, left breast 2003    s/p mastectomy, chemo.  Dr. Park Breed oncologist  . Fracture of humerus, proximal, right, closed 07/2010    s/p hemiarthroplasty  . Osteoporosis   . Cancer   . Hypertension     Past Surgical History  Procedure Laterality Date  . Shoulder hemi-arthroplasty  07/2010    Dr. Dion Saucier  . Abdominal hysterectomy    . Knee arthroscopy      [facility]lateral [facility] at [facility]  . Femur im nail  04/13/2012    Procedure: INTRAMEDULLARY (IM) NAIL FEMORAL;  Surgeon: Venita Lick, MD;  Location: [facility];  Service: Orthopedics;  Laterality: Left;    Family History  Problem Relation Age of Onset  . Myasthenia gravis Mother   . Coronary artery disease Father     History  Substance Use Topics  . Smoking status: Never Smoker   .  Smokeless tobacco: Never Used  . Alcohol Use: No    OB History   Grav Para Term Preterm Abortions TAB SAB Ect Mult Living                  Review of Systems  Constitutional: Negative for fever and fatigue.  Respiratory: Positive for shortness of breath.   Cardiovascular: Negative for chest pain and palpitations.  Gastrointestinal: Negative for nausea, vomiting, abdominal pain, diarrhea, constipation and blood in stool.  Neurological: Negative for dizziness, weakness, light-headedness and numbness.  All other systems reviewed and are negative.    Allergies  Fexofenadine hcl and Penicillins  Home Medications   Current Outpatient Rx  Name  Route  Sig  Dispense  Refill  . aspirin EC 81 MG tablet   Oral   Take 81 mg by mouth daily.         . Calcium Carbonate-Vitamin D (CALTRATE 600+D) 600-400 MG-UNIT per tablet   Oral   Take 1 tablet by mouth 2 (two) times daily.          [name] glucosamine-chondroitin 500-400 MG tablet   Oral   Take 2 tablets by mouth daily.          [name] L-Lysine 500 MG CAPS   Oral   Take 1 capsule by mouth daily.           [name] lisinopril (PRINIVIL,ZESTRIL) 10  MG tablet   Oral   Take 1 tablet (10 mg total) by mouth daily.   90 tablet   3   . Misc Natural Products (APPLE CIDER VINEGAR DIET PO)   Oral   Take 4 oz by mouth daily as needed. As needed for pain. Mix 16 oz grape juice,  4 oz apple cider vinegar, 16 oz apple juice, and honey to taste.  Drink 4 ounces daily.         . vitamin B-12 (CYANOCOBALAMIN) 100 MCG tablet   Oral   Take 50 mcg by mouth daily.           . vitamin C (ASCORBIC ACID) 500 MG tablet   Oral   Take 500 mg by mouth daily.           . vitamin E 400 UNIT capsule   Oral   Take 400 Units by mouth daily.         [name] alendronate (FOSAMAX) 70 MG tablet      TAKE 1 TABLET BY MOUTH ONCE WEEKLY   4 tablet   0     BP 156/61  Pulse 76  Temp(Src) 98.5 F (36.9 C) (Oral)  Resp 18  SpO2 100%  Physical Exam   Nursing note and vitals reviewed. Constitutional: She is oriented to person, place, and time. She appears well-developed and well-nourished. No distress.  HENT:  Head: Normocephalic.  Mouth/Throat: Oropharynx is clear and moist.  Cardiovascular: Normal rate and regular rhythm.   No murmur heard. Pulmonary/Chest: Effort normal and breath sounds normal. No respiratory distress. She has no wheezes. She has no rales.  Abdominal: Soft. She exhibits no distension. There is no tenderness. There is no rebound and no guarding.  Genitourinary:  Chaperone was present. There is black melena type stool. No gross blood. No hemorrhoids. No tenderness.  Musculoskeletal: Normal range of motion.  Neurological: She is alert and oriented to person, place, and time.  Skin: Skin is warm and dry. No rash noted. There is pallor.  Psychiatric: She has a normal mood and affect. Her behavior is normal.    ED Course  Procedures   Results for orders placed during the hospital encounter of 02/13/13  CBC      Result Value Range   WBC 8.4  4.0 - 10.5 K/uL   RBC 3.47 (*) 3.87 - 5.11 MIL/uL   Hemoglobin 7.7 (*) 12.0 - 15.0 g/dL   HCT 64.4 (*) 03.4 - 74.2 %   MCV 71.2 (*) 78.0 - 100.0 fL   MCH 22.2 (*) 26.0 - 34.0 pg   MCHC 31.2  30.0 - 36.0 g/dL   RDW 59.5 (*) 63.8 - 75.6 %   Platelets 305  150 - 400 K/uL  COMPREHENSIVE METABOLIC PANEL      Result Value Range   Sodium 137  135 - 145 mEq/L   Potassium 3.7  3.5 - 5.1 mEq/L   Chloride 104  96 - 112 mEq/L   CO2 25  19 - 32 mEq/L   Glucose, Bld 92  70 - 99 mg/dL   BUN 19  6 - 23 mg/dL   Creatinine, Ser 4.33  0.50 - 1.10 mg/dL   Calcium 9.0  8.4 - 29.5 mg/dL   Total Protein 6.2  6.0 - 8.3 g/dL   Albumin 3.6  3.5 - 5.2 g/dL   AST 19  0 - 37 U/L   ALT 12  0 - 35 U/L   Alkaline Phosphatase 51  39 - 117 U/L   Total Bilirubin 0.2 (*) 0.3 - 1.2 mg/dL   GFR calc non Af Amer 49 (*) >90 mL/min   GFR calc Af Amer 57 (*) >90 mL/min  OCCULT BLOOD, POC DEVICE       Result Value Range   Fecal Occult Bld POSITIVE (*) NEGATIVE        1. GI bleed   2. Anemia       MDM  8:10PM patient seen and evaluated. Patient well-appearing very polite humerus. She is not appear in any acute distress. Does appear slightly pale. Normal respirations O2 sats. Normal heart rate.  Hemoglobin is 7.7. Type and screen ordered. She is Hemoccult-positive dark stools. We'll plan for admission.  Spoke with [facility]. They'll see patient and admit.      Angus Seller, PA-C 02/14/13 [id number]

## 2013-02-13 NOTE — Patient Instructions (Addendum)
Good to see you. Please STOP taking any Aspirin, Alleve, Ibuprofen or other similar medications. Ok to take Tylenol. Start taking Nexium 40 mg every evening. Call me in 2 weeks with an update.

## 2013-02-13 NOTE — ED Notes (Signed)
Pt presenting to ed with c/o sent by pcp for hemoglobin of 7.6  Pt states she has had dark stools x 6 months. Pt denies lightheadedness, dizziness at this time. Pt state intermittent shortness of breath. Pt denies chest pain at this time

## 2013-02-13 NOTE — Telephone Encounter (Signed)
[facility] called critical results- HGB-7.6, results called to Dr Dayton Martes

## 2013-02-13 NOTE — ED Notes (Signed)
Pts daughter states that they did an occult stool test this morning at doctors office with positive result

## 2013-02-14 ENCOUNTER — Encounter (HOSPITAL_COMMUNITY): Payer: Self-pay | Admitting: *Deleted

## 2013-02-14 ENCOUNTER — Encounter (HOSPITAL_COMMUNITY)
Admission: EM | Disposition: A | Discharge status: Home / Self Care | Payer: Self-pay | Source: Home / Self Care | Attending: Internal Medicine

## 2013-02-14 DIAGNOSIS — I10 Essential (primary) hypertension: Secondary | ICD-10-CM

## 2013-02-14 DIAGNOSIS — K254 Chronic or unspecified gastric ulcer with hemorrhage: Secondary | ICD-10-CM

## 2013-02-14 DIAGNOSIS — K2981 Duodenitis with bleeding: Secondary | ICD-10-CM | POA: Diagnosis present

## 2013-02-14 DIAGNOSIS — K921 Melena: Secondary | ICD-10-CM

## 2013-02-14 DIAGNOSIS — K269 Duodenal ulcer, unspecified as acute or chronic, without hemorrhage or perforation: Secondary | ICD-10-CM

## 2013-02-14 HISTORY — PX: ESOPHAGOGASTRODUODENOSCOPY: SHX5428

## 2013-02-14 HISTORY — DX: Duodenal ulcer, unspecified as acute or chronic, without hemorrhage or perforation: K26.9

## 2013-02-14 HISTORY — DX: Chronic or unspecified gastric ulcer with hemorrhage: K25.4

## 2013-02-14 LAB — BASIC METABOLIC PANEL
Calcium: 8.5 mg/dL (ref 8.4–10.5)
GFR calc Af Amer: 53 mL/min — ABNORMAL LOW (ref >90)
GFR calc non Af Amer: 46 mL/min — ABNORMAL LOW (ref >90)
Glucose, Bld: 91 mg/dL (ref 70–99)
Potassium: 3.9 mEq/L (ref 3.5–5.1)
Sodium: 140 mEq/L (ref 135–145)

## 2013-02-14 LAB — CBC
MCH: 21.9 pg — ABNORMAL LOW (ref 26.0–34.0)
MCHC: 30.8 g/dL (ref 30.0–36.0)
Platelets: 292 10*3/uL (ref 150–400)
RDW: 28.4 % — ABNORMAL HIGH (ref 11.5–15.5)

## 2013-02-14 LAB — PREPARE RBC (CROSSMATCH)

## 2013-02-14 LAB — IRON AND TIBC
Iron: 10 ug/dL — ABNORMAL LOW (ref 42–135)
Saturation Ratios: 4 % — ABNORMAL LOW (ref 20–55)
TIBC: 253 ug/dL (ref 250–470)
UIBC: 243 ug/dL (ref 125–400)

## 2013-02-14 LAB — HEMOGLOBIN AND HEMATOCRIT, BLOOD: Hemoglobin: 8.7 g/dL — ABNORMAL LOW (ref 12.0–15.0)

## 2013-02-14 LAB — APTT: aPTT: 29 seconds (ref 24–37)

## 2013-02-14 LAB — FERRITIN: Ferritin: 12 ng/mL (ref 10–291)

## 2013-02-14 SURGERY — EGD (ESOPHAGOGASTRODUODENOSCOPY)
Anesthesia: Moderate Sedation

## 2013-02-14 MED ORDER — FUROSEMIDE 10 MG/ML IJ SOLN: 20.0000 mg | Freq: Once | INTRAMUSCULAR | Status: DC

## 2013-02-14 MED ORDER — MIDAZOLAM HCL 10 MG/2ML IJ SOLN
INTRAMUSCULAR | Status: DC | PRN
  Administered 2013-02-14 (×2): 1 mg via INTRAVENOUS
  Administered 2013-02-14: 2 mg via INTRAVENOUS

## 2013-02-14 MED ORDER — SODIUM CHLORIDE 0.45 % IV SOLN
INTRAVENOUS | Status: AC
  Administered 2013-02-14: 14:00:00 via INTRAVENOUS

## 2013-02-14 MED ORDER — BUTAMBEN-TETRACAINE-BENZOCAINE 2-2-14 % EX AERO
INHALATION_SPRAY | CUTANEOUS | Status: DC | PRN
  Administered 2013-02-14: 2 via TOPICAL

## 2013-02-14 MED ORDER — FENTANYL CITRATE 0.05 MG/ML IJ SOLN
INTRAMUSCULAR | Status: DC | PRN
  Administered 2013-02-14 (×2): 25 ug via INTRAVENOUS

## 2013-02-14 MED ORDER — PANTOPRAZOLE SODIUM 40 MG PO TBEC
40.0000 mg | DELAYED_RELEASE_TABLET | Freq: Two times a day (BID) | ORAL | Status: DC
  Administered 2013-02-14 – 2013-02-15 (×2): 40 mg via ORAL
  Filled 2013-02-14 (×4): qty 1

## 2013-02-14 NOTE — Progress Notes (Signed)
CRITICAL VALUE ALERT  Critical value received:  Hgb 6.8  Date of notification:  02/14/13  Time of notification:  06:00  Critical value read back:yes  Nurse who received alert:  Lorretta Harp, RN  MD notified (1st page):  Molli Barrows, NP  Time of first page:  06:10  MD notified (2nd page):  Time of second page:  Responding MD:  Molli Barrows, NP  Time MD responded:  06:20

## 2013-02-14 NOTE — Op Note (Signed)
[facility] [address] [place], [address]   ENDOSCOPY PROCEDURE REPORT  PATIENT: Katie, Silva  MR#: [id number] BIRTHDATE: 1926/02/24 , 87  yrs. old GENDER: Female ENDOSCOPIST: Iva Boop, MD, [facility] PROCEDURE DATE:  02/14/2013 PROCEDURE:  EGD w/ biopsy ASA CLASS:     Class III INDICATIONS:  Iron deficiency anemia. MEDICATIONS: Fentanyl 50 mcg IV and Versed-Detailed 4.5 mg IV TOPICAL ANESTHETIC: Cetacaine Spray  DESCRIPTION OF PROCEDURE: After the risks benefits and alternatives of the procedure were thoroughly explained, informed consent was obtained.  The Pentax Gastroscope [id number] endoscope was introduced through the mouth and advanced to the second portion of the duodenum. Without limitations.  The instrument was slowly withdrawn as the mucosa was fully examined.        STOMACH: Two medium sized non-bleeding round and clean-based ulcers were found in the prepyloric region of the stomach and gastric antrum.  Biopsies were taken at edge of the ulcers and at the center of the ulcers.  DUODENUM: Three small round and clean-based ulcers with a red spot were found in the duodenal bulb.  The remainder of the upper endoscopy exam was otherwise normal. Retroflexed views revealed no abnormalities.     The scope was then withdrawn from the patient and the procedure completed.  COMPLICATIONS: There were no complications. ENDOSCOPIC IMPRESSION: 1.   Two medium sized non-bleeding and clean-based ulcers were found in the prepyloric region of the stomach and gastric antrum - biopsied 2.   Three small ulcers were found in the duodenal bulb one w/ red spot other 2 clean-based 3.   The remainder of the upper endoscopy exam was otherwise normal       RECOMMENDATIONS: 1.  Continue PPI - change to oral, should be able to go home on qd high-dose, feed 2.   Finish transfusing, home in 24-48 hrs if possible 3.  Stay off ASA, alendronate (suspect these  were causes) any other NSAID's and also stop vit E 4.   Needs GI office visit in 3-4 weeks, Hgb check at my office in 1 week 5.   will determine if any further work-up needed then and after bx review   eSigned:  Iva Boop, MD, [facility] 02/14/2013 12:32 PM   CC: [name], MD, The Patient  PATIENT NAME:  Katie, Silva MR#: [id number]

## 2013-02-14 NOTE — Progress Notes (Signed)
Utilization review completed.  

## 2013-02-14 NOTE — Progress Notes (Signed)
[name] PROGRESS NOTE  Katie Silva WUJ:[id number] DOB: 08-May-1926 DOA: 02/13/2013  PCP: [name], MD  Brief HPI: Katie Silva is a 79 y.o. female who presented to her PCP with c/o chronic SOB and DOE. She has apparently had this progressively worsening recently. She has also had dark stools for the past 6 months she estimates. Stools are occasionally loose but typically normal consistancy to constipation. No vomiting, no abdominal pain, does have decreased appetitie. Did admit to taking alleve and ibuprofen daily for "aches, pains, and headaches". At her PCPs office guiac testing was positive and the patients HGB was found to be 7.6, she was sent to the ED for further evaluation.   Past medical history:  Past Medical History  Diagnosis Date  . Breast cancer, left breast 2003    s/p mastectomy, chemo.  Dr. Park Breed oncologist  . Fracture of humerus, proximal, right, closed 07/2010    s/p hemiarthroplasty  . Osteoporosis   . Cancer   . Hypertension     Consultants: [facility]  Procedures: None yet  Antibiotics: None  Subjective: Patient feels well. Denies any pain. No nausea or vomiting. No dizziness.  Objective: Vital Signs  Filed Vitals:   02/13/13 2007 02/13/13 2009 02/13/13 2211 02/14/13 0522  BP: 166/55 156/61 152/64 151/66  Pulse: 74 76 65 58  Temp:   97.8 F (36.6 C) 97.8 F (36.6 C)  TempSrc:   Oral Oral  Resp:   20 18  Height:   5\' 3"  (1.6 m)   Weight:   61.598 kg (135 lb 12.8 oz)   SpO2:   100% 98%    Intake/Output Summary (Last 24 hours) at 02/14/13 1003 Last data filed at 02/14/13 0951  Gross per 24 hour  Intake 424.17 ml  Output    750 ml  Net -325.83 ml   Filed Weights   02/13/13 2211  Weight: 61.598 kg (135 lb 12.8 oz)   General appearance: alert, cooperative, appears stated age and no distress Head: Normocephalic, without obvious abnormality, atraumatic Throat: lips, mucosa, and tongue normal; teeth and gums normal Resp: clear to  auscultation bilaterally Cardio: regular rate and rhythm, S1, S2 normal, no murmur, click, rub or gallop GI: soft, non-tender; bowel sounds normal; no masses,  no organomegaly Extremities: extremities normal, atraumatic, no cyanosis or edema Pulses: 2+ and symmetric Skin: Skin color, texture, turgor normal. No rashes or lesions Lymph nodes: Cervical, supraclavicular, and axillary nodes normal. Neurologic: Alert. No focal deficits.  Lab Results:  Basic Metabolic Panel:  Recent Labs Lab 02/13/13 1905 02/14/13 0535  NA 137 140  K 3.7 3.9  CL 104 110  CO2 25 25  GLUCOSE 92 91  BUN 19 15  CREATININE 1.01 1.07  CALCIUM 9.0 8.5   Liver Function Tests:  Recent Labs Lab 02/13/13 1905  AST 19  ALT 12  ALKPHOS 51  BILITOT 0.2*  PROT 6.2  ALBUMIN 3.6   CBC:  Recent Labs Lab 02/13/13 1323 02/13/13 1905 02/14/13 0535  WBC 8.1 8.4 6.2  NEUTROABS 5.3  --   --   HGB 7.6 Repeated and verified X2.* 7.7* 6.8*  HCT 24.7 Repeated and verified X2.* 24.7* 22.1*  MCV 73.1* 71.2* 71.1*  PLT 337.0 305 292   Studies/Results: No results found.  Medications:  Scheduled: . lisinopril  10 mg Oral Daily  . sodium chloride  3 mL Intravenous Q12H   Continuous: . pantoprozole (PROTONIX) infusion 8 mg/hr (02/14/13 0002)   PRN:  Assessment/Plan:  Principal Problem:   GI bleed Active Problems:   Blood loss anemia    Melena Check coags. PPI infusion. Consulted GI. They will try to scope her later today. She is hemodynamically stable. This was likely secondary to NSAID use. Other differential include PUD, cancer.   Blood loss anemia Considering she has had melena for months this is likely acute on chronic. Being transfused this morning. Check CBC post transfusion.   History of HTN BP slightly high. Continue Lisinopril.  Remote history of Breast Ca Stable  DVT Prophylaxis: SCD's Code Status: Full Code  Family Communication: Discussed with patient and daughter at bedside   Disposition Plan: Await EGD. Leave NPO.    LOS: 1 day   [facility]  [name] Pager [phone] 02/14/2013, 10:03 AM  If 8PM-8AM, please contact night-coverage at [email or web address], password [facility]

## 2013-02-14 NOTE — Consult Note (Signed)
Referring Provider: [facility] Primary Care Physician:  [name], MD Primary Gastroenterologist:  none  Reason for Consultation: Marked anemia,heme + stool ,hx of melena  HPI: Katie Silva is a 79 y.o. female admitted last evening after she had gone to her primary care physician Dr. Dayton Martes yesterday with complaints of dark stools and shortness of breath . Labs were done in the office and returned showing a hemoglobin of 7.6, she was also documented to be Hemoccult-positive. The patient states that she has been seen dark stools for "some time" She really cannot be specific but says this been going on for several weeks at least. She has not noted any change in her bowel habits, denies any abdominal pain nausea vomiting or hematemesis. She has no complaints of dysphagia or odynophagia. She says her appetite is fair and her weight has been stable . She had been taking one baby aspirin per day long-term and says she stopped this a while ago because she noticed she was bruising easily and then started taking one Aleve every morning for general aches and pains. She thinks she may have had a colonoscopy 30 years ago has not had any GI evaluation since then. Family history is negative for colon cancer and polyps but pertinent for a daughter who recently died from metastatic gastric cancer. Labs this morning show hemoglobin of 6.8 hematocrit of 22.1 and MCV of 71.1. She is to be transfused this morning   Past Medical History  Diagnosis Date  . Breast cancer, left breast 2003    s/p mastectomy, chemo.  Dr. Park Breed oncologist  . Fracture of humerus, proximal, right, closed 07/2010    s/p hemiarthroplasty  . Osteoporosis   . Cancer   . Hypertension     Past Surgical History  Procedure Laterality Date  . Shoulder hemi-arthroplasty  07/2010    Dr. Dion Saucier  . Abdominal hysterectomy    . Knee arthroscopy      [facility]  . Femur im nail  04/13/2012    Procedure: INTRAMEDULLARY (IM)  NAIL FEMORAL;  Surgeon: Venita Lick, MD;  Location: [facility];  Service: Orthopedics;  Laterality: Left;    Prior to Admission medications   Medication Sig Start Date End Date Taking? Authorizing Provider  aspirin EC 81 MG tablet Take 81 mg by mouth daily.   Yes Historical Provider, MD  Calcium Carbonate-Vitamin D (CALTRATE 600+D) 600-400 MG-UNIT per tablet Take 1 tablet by mouth 2 (two) times daily.    Yes Historical Provider, MD  glucosamine-chondroitin 500-400 MG tablet Take 2 tablets by mouth daily.  10/05/98  Yes Historical Provider, MD  L-Lysine 500 MG CAPS Take 1 capsule by mouth daily.     Yes Historical Provider, MD  lisinopril (PRINIVIL,ZESTRIL) 10 MG tablet Take 1 tablet (10 mg total) by mouth daily. 06/17/12  Yes Dianne Dun, MD  Misc Natural Products (APPLE CIDER VINEGAR DIET PO) Take 4 oz by mouth daily as needed. As needed for pain. Mix 16 oz grape juice,  4 oz apple cider vinegar, 16 oz apple juice, and honey to taste.  Drink 4 ounces daily.   Yes Historical Provider, MD  vitamin B-12 (CYANOCOBALAMIN) 100 MCG tablet Take 50 mcg by mouth daily.     Yes Historical Provider, MD  vitamin C (ASCORBIC ACID) 500 MG tablet Take 500 mg by mouth daily.     Yes Historical Provider, MD  vitamin E 400 UNIT capsule Take 400 Units by mouth daily.   Yes  Historical Provider, MD  alendronate (FOSAMAX) 70 MG tablet TAKE 1 TABLET BY MOUTH ONCE WEEKLY 09/23/12   Victorino December, MD    Current Facility-Administered Medications  Medication Dose Route Frequency Provider Last Rate Last Dose  . lisinopril (PRINIVIL,ZESTRIL) tablet 10 mg  10 mg Oral Daily Hillary Bow, DO   10 mg at 02/14/13 0017  . pantoprazole (PROTONIX) 80 mg in sodium chloride 0.9 % 250 mL infusion  8 mg/hr Intravenous Continuous Ankit Nanavati, MD 25 mL/hr at 02/14/13 0002 8 mg/hr at 02/14/13 0002  . sodium chloride 0.9 % injection 3 mL  3 mL Intravenous Q12H Hillary Bow, DO   3 mL at 02/14/13 0002    Allergies as of  02/13/2013 - Review Complete 02/13/2013  Allergen Reaction Noted  . Fexofenadine hcl Other (See Comments) 01/30/2011  . Penicillins  10/06/2011    Family History  Problem Relation Age of Onset  . Myasthenia gravis Mother   . Coronary artery disease Father   . Colon cancer Sister     History   Social History  . Marital Status: Widowed    Spouse Name: N/A    Number of Children: N/A    Social History Main Topics  . Smoking status: Never Smoker   . Smokeless tobacco: Never Used  . Alcohol Use: No  . Drug Use: No  . Sexually Active: Not Currently          Social History Narrative   Lives alone but daughter lives behind her and very involved in her care.   Independent for all ADLs.    Review of Systems: Pertinent positive and negative review of systems were noted in the above HPI section.  All other review of systems was otherwise negative.  Physical Exam: Vital signs in last 24 hours: Temp:  [97.8 F (36.6 C)-98.5 F (36.9 C)] 97.8 F (36.6 C) (06/10 0522) Pulse Rate:  [58-76] 58 (06/10 0522) Resp:  [18-20] 18 (06/10 0522) BP: (122-166)/(55-72) 151/66 mmHg (06/10 0522) SpO2:  [96 %-100 %] 98 % (06/10 0522) Weight:  [135 lb 12.8 oz (61.598 kg)-137 lb (62.143 kg)] 135 lb 12.8 oz (61.598 kg) (06/09 2211) Last BM Date: 02/13/13 General:   Alert,  Well-developed, elderly WF, well-nourished, pleasant and cooperative in NAD Head:  Normocephalic and atraumatic. Eyes:  Sclera clear, no icterus.   Conjunctiva pale. Ears:  Normal auditory acuity. Nose:  No deformity, discharge,  or lesions. Mouth:  No deformity or lesions.   Neck:  Supple; no masses or thyromegaly. Lungs:  Clear throughout to auscultation.   No wheezes, crackles, or rhonchi. Heart:  Regular rate and rhythm;soft syst murmur Abdomen:  Soft,nontender, BS active,nonpalp mass or hsm.   Rectal:  Deferred-done in Er and documented heme positive  Msk:  Symmetrical without gross deformities. . Pulses:  Normal  pulses noted. Extremities:  Without clubbing or edema. Neurologic:  Alert and  oriented x4;  grossly normal neurologically. Skin:  Intact without significant lesions or rashes.. Psych:  Alert and cooperative. Normal mood and affect.  Lab Results:  Recent Labs  02/13/13 1323 02/13/13 1905 02/14/13 0535  WBC 8.1 8.4 6.2  HGB 7.6 Repeated and verified X2.* 7.7* 6.8*  HCT 24.7 Repeated and verified X2.* 24.7* 22.1*  PLT 337.0 305 292   BMET  Recent Labs  02/13/13 1905 02/14/13 0535  NA 137 140  K 3.7 3.9  CL 104 110  CO2 25 25  GLUCOSE 92 91  BUN 19 15  CREATININE  1.01 1.07  CALCIUM 9.0 8.5   LFT  Recent Labs  02/13/13 1905  PROT 6.2  ALBUMIN 3.6  AST 19  ALT 12  ALKPHOS 51  BILITOT 0.2*   PT/INR Lab Results  Component Value Date   INR 1.14 02/14/2013   INR 1.35 04/18/2012   INR 1.15 04/17/2012       IMPRESSION:  #43 79 year old female with profound microcytic anemia, and Hemoccult-positive stool and recent history of melena. Etiology of blood loss is not clear, will rule out upper GI source today i.e. esophagitis gastropathy peptic ulcer disease or occult malignancy. If EGD is negative she will need colonoscopy #2 dyspnea on exertion secondary to above #3 history of breast cancer 2011 #4 status post multiple orthopedic surgeries #5 history of SVT #6 hypertension   PLAN: #1 transfuse 2 units of packed RBCs today #2 have scheduled for upper endoscopy with Dr. Leone Payor, today. Procedure discussed in detail with the patient and her daughter and they are agreeable to proceed. If EGD is negative she will need to be prepped for colonoscopy this admission #3 anemia panel ordered #4 continue  BID PPI     Amy Esterwood  02/14/2013, 10:00 AM  [facility] Attending  I have also seen and assessed the patient and agree with the above note. Hx reviewed/repeated/PE repeated Possible triggers or causes of bleeding are ASA, Fosamax causing ulcers, cancer,  AVM's. EGD today and if unrevealing will need a colonoscopy. The risks and benefits as well as alternatives of endoscopic procedure(s) have been discussed and reviewed with patient and daughter. All questions answered. The patient agrees to proceed.  Iva Boop, MD, [facility] [phone] (pager) 02/14/2013 11:51 AM

## 2013-02-14 NOTE — Progress Notes (Signed)
INITIAL NUTRITION ASSESSMENT  DOCUMENTATION CODES Per approved criteria  -Not Applicable   INTERVENTION: - Ensure Complete BID - Educated pt and family on high calorie/protein diet to prevent any further unintended weight loss and perserve lean body mass - Will continue to monitor   NUTRITION DIAGNOSIS: Inadequate oral intake related to difficulty chewing as evidenced by pt report, unintended weight loss.   Goal: Pt to consume >90% of meals/supplements  Monitor:  Weights, labs, intake  Reason for Assessment: Nutrition risk   79 y.o. female  Admitting Dx: GI bleed  ASSESSMENT: Pt admitted with shortness of breath and dark stools x 6 months. Pt had EGD today that showed two medium sized ulcers.  Met with pt who reports eating 3 meals/day at home but it can take her up to 1 hour to eat r/t false teeth not fitting well. Pt reports 7 pound unintended weight loss in the past 2 months. Daughter recently bought pt Glucerna to help with pt's intake and reports pt does not get enough protein in her diet.   Height: Ht Readings from Last 1 Encounters:  02/13/13 5\' 3"  (1.6 m)    Weight: Wt Readings from Last 1 Encounters:  02/13/13 135 lb 12.8 oz (61.598 kg)    Ideal Body Weight: 115 lb   % Ideal Body Weight: 117  Wt Readings from Last 10 Encounters:  02/13/13 135 lb 12.8 oz (61.598 kg)  02/13/13 135 lb 12.8 oz (61.598 kg)  02/13/13 137 lb (62.143 kg)  06/17/12 141 lb 8 oz (64.184 kg)  04/14/12 140 lb (63.504 kg)  04/14/12 140 lb (63.504 kg)  03/31/12 142 lb 1.6 oz (64.456 kg)  10/06/11 150 lb (68.04 kg)  09/21/11 152 lb 8 oz (69.174 kg)  08/05/11 146 lb (66.225 kg)    Usual Body Weight: 142 in April 2014  % Usual Body Weight: 95  BMI:  Body mass index is 24.06 kg/(m^2).  Estimated Nutritional Needs: Kcal: 1550-1850 Protein: 60-75g Fluid: 1.5-1.8L/day  Skin: Intact   Diet Order: General  EDUCATION NEEDS: -Education needs addressed - educated pt on high  calorie/protein diet and provided handout of this information    Intake/Output Summary (Last 24 hours) at 02/14/13 1512 Last data filed at 02/14/13 1314  Gross per 24 hour  Intake 849.17 ml  Output    750 ml  Net  99.17 ml    Last BM: 6/9  Labs:   Recent Labs Lab 02/13/13 1905 02/14/13 0535  NA 137 140  K 3.7 3.9  CL 104 110  CO2 25 25  BUN 19 15  CREATININE 1.01 1.07  CALCIUM 9.0 8.5  GLUCOSE 92 91    CBG (last 3)  No results found for this basename: GLUCAP,  in the last 72 hours  Scheduled Meds: . furosemide  20 mg Intravenous Once  . lisinopril  10 mg Oral Daily  . pantoprazole  40 mg Oral BID AC  . sodium chloride  3 mL Intravenous Q12H    Continuous Infusions: . sodium chloride 40 mL/hr at 02/14/13 1426    Past Medical History  Diagnosis Date  . Breast cancer, left breast 2003    s/p mastectomy, chemo.  Dr. Park Breed oncologist  . Fracture of humerus, proximal, right, closed 07/2010    s/p hemiarthroplasty  . Osteoporosis   . Cancer   . Hypertension   . Anemia   . Arthritis     hands, knees  . Gastric ulcer with hemorrhage 02/14/2013  . Multiple duodenal  ulcers 02/14/2013    Past Surgical History  Procedure Laterality Date  . Shoulder hemi-arthroplasty  07/2010    Dr. Dion Saucier  . Abdominal hysterectomy    . Knee arthroscopy      [facility] at [facility]  . Femur im nail  04/13/2012    Procedure: INTRAMEDULLARY (IM) NAIL FEMORAL;  Surgeon: Venita Lick, MD;  Location: [facility];  Service: Orthopedics;  Laterality: Left;     Levon Hedger MS, RD, LDN [phone] Pager [phone] After Hours Pager

## 2013-02-15 ENCOUNTER — Other Ambulatory Visit: Payer: Self-pay

## 2013-02-15 ENCOUNTER — Encounter: Payer: Self-pay | Admitting: Internal Medicine

## 2013-02-15 ENCOUNTER — Encounter (HOSPITAL_COMMUNITY): Payer: Self-pay | Admitting: Internal Medicine

## 2013-02-15 DIAGNOSIS — K2981 Duodenitis with bleeding: Secondary | ICD-10-CM

## 2013-02-15 DIAGNOSIS — C50919 Malignant neoplasm of unspecified site of unspecified female breast: Secondary | ICD-10-CM

## 2013-02-15 DIAGNOSIS — K922 Gastrointestinal hemorrhage, unspecified: Secondary | ICD-10-CM

## 2013-02-15 DIAGNOSIS — A048 Other specified bacterial intestinal infections: Secondary | ICD-10-CM | POA: Insufficient documentation

## 2013-02-15 HISTORY — DX: Other specified bacterial intestinal infections: A04.8

## 2013-02-15 LAB — CBC
MCH: 22.3 pg — ABNORMAL LOW (ref 26.0–34.0)
Platelets: 311 10*3/uL (ref 150–400)
RBC: 3.5 MIL/uL — ABNORMAL LOW (ref 3.87–5.11)
RDW: 26.5 % — ABNORMAL HIGH (ref 11.5–15.5)

## 2013-02-15 LAB — BASIC METABOLIC PANEL
CO2: 24 mEq/L (ref 19–32)
Calcium: 8.3 mg/dL — ABNORMAL LOW (ref 8.4–10.5)
GFR calc Af Amer: 47 mL/min — ABNORMAL LOW (ref >90)
GFR calc non Af Amer: 41 mL/min — ABNORMAL LOW (ref >90)
Sodium: 140 mEq/L (ref 135–145)

## 2013-02-15 MED ORDER — FERROUS SULFATE 325 (65 FE) MG PO TABS
325.0000 mg | ORAL_TABLET | Freq: Two times a day (BID) | ORAL | Status: DC
  Filled 2013-02-15 (×2): qty 1

## 2013-02-15 MED ORDER — PANTOPRAZOLE SODIUM 40 MG PO TBEC: 40.0000 mg | DELAYED_RELEASE_TABLET | Freq: Every day | ORAL | Status: DC

## 2013-02-15 MED ORDER — FERROUS SULFATE 325 (65 FE) MG PO TABS: 325.0000 mg | ORAL_TABLET | Freq: Two times a day (BID) | ORAL | Status: DC

## 2013-02-15 NOTE — Care Management Note (Addendum)
    Page 1 of 1   02/15/2013     3:02:44 PM   CARE MANAGEMENT NOTE 02/15/2013  Patient:  Katie Silva, Katie Silva   Account Number:  [id number]  Date Initiated:  02/14/2013  Documentation initiated by:  Colleen Can  Subjective/Objective Assessment:   dx GI bleed     Action/Plan:   CM spoke with pt and daughter. Plans are for patient to return to her home where her daughter will be primary caregiver. She already has RW. No HH orders at this time.   Anticipated DC Date:  02/15/2013   Anticipated DC Plan:  HOME/SELF CARE      DC Planning Services  CM consult      Choice offered to / List presented to:             Status of service:  Completed, signed off Medicare Important Message given?   (If response is "NO", the following Medicare IM given date fields will be blank) Date Medicare IM given:   Date Additional Medicare IM given:    Discharge Disposition:  HOME/SELF CARE  Per UR Regulation:  Reviewed for med. necessity/level of care/duration of stay  If discussed at Long Length of Stay Meetings, dates discussed:   02/14/2013    Comments:  02/15/13 Margalit Leece RN,BSN NCM [phone] D/C HOME NO ORDERS OR HH NEEDS.

## 2013-02-15 NOTE — Discharge Summary (Signed)
Physician Discharge Summary  Katie Silva AVW:[id number] DOB: 13-Sep-1925 DOA: 02/13/2013  PCP: [name], MD  Admit date: 02/13/2013 Discharge date: 02/15/2013  Recommendations for Outpatient Follow-up:  1. Pt will need to follow up with PCP in 2 weeks post discharge 2. Please obtain BMP to evaluate electrolytes and kidney function 3. Please also check CBC to evaluate Hg and Hct levels 4. Follow up with Dr. Leone Payor on 03/06/13   Discharge Diagnoses:  Principal Problem:   GI bleed Active Problems:   Blood loss anemia   Gastric ulcer with hemorrhage   Multiple duodenal ulcers  melanotic stool -PTT and INR were within normal limits -EGD 02/14/2013 showed a medium-sized nonbleeding clean-based ulcers in the prepyloric region as well as 3 small duodenal bulb ulcers all of which were nonbleeding -Continue Protonix 40 mg daily at home -As per gastroenterology recommendations, the patient is to discontinue aspirin, Fosamax, and NSAIDs -Patient has followup with Dr. Leone Payor on 03/06/13 Blood loss anemia -Iron studies also revealed iron deficiency with iron saturation of 4% -Start ferrous sulfate 325 mg twice a day -Patient received 1 unit PRBCs, and her hemoglobin remained stable thereafter -Patient remained hemodynamically stable Hypertension -Continue lisinopril -Blood pressure remained controlled throughout the hospitalization History of breast cancer -Stable  Discharge Condition: stable  Disposition:  Follow-up Information   Follow up with Stan Head, MD On 03/06/2013. (at 11:30 am)    Contact information:   [address] [place] [address] [phone]       Diet: Heart healthy Wt Readings from Last 3 Encounters:  02/13/13 61.598 kg (135 lb 12.8 oz)  02/13/13 61.598 kg (135 lb 12.8 oz)  02/13/13 62.143 kg (137 lb)    History of present illness:  79 y.o. female who presented to her PCP earlier today with c/o chronic SOB and DOE. She has apparently had this  progressively worsening recently. She has also had dark stools for the past 6 months she estimates. Stools are occasionally loose but typically normal consistancy to constipation. No vomiting, no abdominal pain, does have decreased appetitie. Does admit to taking alleve AND ibuprofen daily for "aches, pains, and headaches". At her PCPs office guiac testing was positive and the patients HGB was found to be 7.6, she was sent to the ED for further evaluation.  In the ED guiac was again positive and HGB 7.7. She is not really acutely symptomatic so she was not transfused blood, hospitalist has been asked to admit for GI bleed. Patient's hemoglobin did drop to 6.8 on 02/14/2013 the patient was transfused 1 unit PRBCs. Gastroenterology was consulted, and Protonix was started. The patient remained hemodynamically stable.  Consultants: [facility]  Discharge Exam: Filed Vitals:   02/15/13 0536  BP: 156/73  Pulse: 62  Temp: 97.4 F (36.3 C)  Resp: 18   Filed Vitals:   02/14/13 1400 02/14/13 1427 02/14/13 2108 02/15/13 0536  BP: 142/63 117/87 141/63 156/73  Pulse: 56 58 60 62  Temp: 97.4 F (36.3 C) 97.8 F (36.6 C) 97.5 F (36.4 C) 97.4 F (36.3 C)  TempSrc: Oral Oral Oral Oral  Resp: 18 18 19 18   Height:      Weight:      SpO2:   100% 99%   General: A&O x 3, NAD, pleasant, cooperative Cardiovascular: RRR, no rub, no gallop, no S3 Respiratory: CTAB, no wheeze, no rhonchi Abdomen:soft, nontender, nondistended, positive bowel sounds Extremities: No edema, No lymphangitis, no petechiae  Discharge Instructions  Discharge Orders  Future Appointments Provider Department Dept Phone   03/06/2013 11:30 AM Iva Boop, MD [facility] [phone]   04/03/2013 10:00 AM [facility] ON[facility] [phone]   04/03/2013 10:30 AM Victorino December, MD [facility] ONCOL[facility] [phone]   Future Orders Complete By  Expires     Diet - low sodium heart healthy  As directed     Discharge instructions  As directed     Comments:      Stop Taking Aspirin and  Fosamax until you see Dr. Leone Payor and he clears you to restart Do not take motrin, ibuprofen, BC powder or aleve type products    Increase activity slowly  As directed         Medication List    STOP taking these medications       alendronate 70 MG tablet  Commonly known as:  FOSAMAX     aspirin EC 81 MG tablet      TAKE these medications       APPLE CIDER VINEGAR DIET PO  Take 4 oz by mouth daily as needed. As needed for pain. Mix 16 oz grape juice,  4 oz apple cider vinegar, 16 oz apple juice, and honey to taste.  Drink 4 ounces daily.     CALTRATE 600+D 600-400 MG-UNIT per tablet  Generic drug:  Calcium Carbonate-Vitamin D  Take 1 tablet by mouth 2 (two) times daily.     ferrous sulfate 325 (65 FE) MG tablet  Take 1 tablet (325 mg total) by mouth 2 (two) times daily with a meal.     glucosamine-chondroitin 500-400 MG tablet  Take 2 tablets by mouth daily.     L-Lysine 500 MG Caps  Take 1 capsule by mouth daily.     lisinopril 10 MG tablet  Commonly known as:  PRINIVIL,ZESTRIL  Take 1 tablet (10 mg total) by mouth daily.     pantoprazole 40 MG tablet  Commonly known as:  PROTONIX  Take 1 tablet (40 mg total) by mouth daily.     vitamin B-12 100 MCG tablet  Commonly known as:  CYANOCOBALAMIN  Take 50 mcg by mouth daily.     vitamin C 500 MG tablet  Commonly known as:  ASCORBIC ACID  Take 500 mg by mouth daily.     vitamin E 400 UNIT capsule  Take 400 Units by mouth daily.         The results of significant diagnostics from this hospitalization (including imaging, microbiology, ancillary and laboratory) are listed below for reference.    Significant Diagnostic Studies: No results found.   Microbiology: No results found for this or any previous visit (from the past 240 hour(s)).   Labs: Basic Metabolic  Panel:  Recent Labs Lab 02/13/13 1905 02/14/13 0535 02/15/13 0453  NA 137 140 140  K 3.7 3.9 4.2  CL 104 110 110  CO2 25 25 24   GLUCOSE 92 91 93  BUN 19 15 16   CREATININE 1.01 1.07 1.18*  CALCIUM 9.0 8.5 8.3*   Liver Function Tests:  Recent Labs Lab 02/13/13 1905  AST 19  ALT 12  ALKPHOS 51  BILITOT 0.2*  PROT 6.2  ALBUMIN 3.6   No results found for this basename: LIPASE, AMYLASE,  in the last 168 hours No results found for this basename: AMMONIA,  in the last 168 hours CBC:  Recent Labs Lab 02/13/13 1323 02/13/13 1905 02/14/13 0535 02/14/13 1607 02/15/13  0453  WBC 8.1 8.4 6.2  --  7.7  NEUTROABS 5.3  --   --   --   --   HGB 7.6 Repeated and verified X2.* 7.7* 6.8* 8.7* 7.8*  HCT 24.7 Repeated and verified X2.* 24.7* 22.1* 27.3* 25.3*  MCV 73.1* 71.2* 71.1*  --  72.3*  PLT 337.0 305 292  --  311   Cardiac Enzymes: No results found for this basename: CKTOTAL, CKMB, CKMBINDEX, TROPONINI,  in the last 168 hours BNP: No components found with this basename: POCBNP,  CBG: No results found for this basename: GLUCAP,  in the last 168 hours  Time coordinating discharge:  Greater than 30 minutes  Signed:  King Pinzon, DO [name] Pager: [phone] 02/15/2013, 1:59 PM

## 2013-02-15 NOTE — Progress Notes (Signed)
Patient ID: Katie Silva, female   DOB: November 06, 1925, 79 y.o.   MRN: [id number] [facility] Progress Note  Subjective: Doing well -explained EGD findings-she is still having difficulty understanding connection between black stool and ulcers- says she had ulcers years ago. No c/o abdominal pain  Objective:  Vital signs in last 24 hours: Temp:  [97.4 F (36.3 C)-98.7 F (37.1 C)] 97.4 F (36.3 C) (06/11 0536) Pulse Rate:  [53-73] 62 (06/11 0536) Resp:  [16-23] 18 (06/11 0536) BP: (106-173)/(50-92) 156/73 mmHg (06/11 0536) SpO2:  [98 %-100 %] 99 % (06/11 0536) Last BM Date: 02/13/13 General:   Alert,  Well-developed,elderly WF    in NAD Heart:  Regular rate and rhythm; no murmurs Pulm;clear Abdomen:  Soft, nontender and nondistended. Normal bowel sounds, without guarding, and without rebound.   Extremities:  Without edema. Neurologic:  Alert and  oriented x4;  grossly normal neurologically. Psych:  Alert and cooperative. Normal mood and affect.   Lab Results:  Fe  10,TIBC 283 sat 4  Recent Labs  02/13/13 1905 02/14/13 0535 02/14/13 1607 02/15/13 0453  WBC 8.4 6.2  --  7.7  HGB 7.7* 6.8* 8.7* 7.8*  HCT 24.7* 22.1* 27.3* 25.3*  PLT 305 292  --  311   BMET  Recent Labs  02/13/13 1905 02/14/13 0535 02/15/13 0453  NA 137 140 140  K 3.7 3.9 4.2  CL 104 110 110  CO2 25 25 24   GLUCOSE 92 91 93  BUN 19 15 16   CREATININE 1.01 1.07 1.18*  CALCIUM 9.0 8.5 8.3*   LFT  Recent Labs  02/13/13 1905  PROT 6.2  ALBUMIN 3.6  AST 19  ALT 12  ALKPHOS 51  BILITOT 0.2*   PT/INR  Recent Labs  02/14/13 1015  LABPROT 14.4  INR 1.14     Assessment / Plan: #1  79 yo female with Fe deficiency anemia, and recent melena- Multiple clean based ulcers on EGD BX pending, no active bleeding Ulcers likely medication related- needs to stop ASA, NSAIDS and Fosamax Ok to discharge home today on Protonix  40 mg daily Integra plus iron rx  One daily Pt will have cbc  next week at Dr. Dellie Catholic office Follow up with Dr. Leone Payor on 6/30  At 11:30 am    LOS: 2 days   Amy Esterwood  02/15/2013, 9:15 AM  [facility] Attending  I have also seen and assessed the patient and agree with the above note. Iva Boop, MD, [facility] [phone] (pager) 02/15/2013 1:18 PM

## 2013-02-15 NOTE — ED Provider Notes (Signed)
Medical screening examination/treatment/procedure(s) were conducted as a shared visit with non-physician practitioner(s) and myself.  I personally evaluated the patient during the encounter  Derwood Kaplan, MD 02/15/13 1510

## 2013-02-15 NOTE — Progress Notes (Signed)
Patients heart rate increases to 120's at times when patient is anxious. No shortness of breath or pain noted. MD was notified. No new orders. Patients daughter at bedside and reported that patients heart rate does increase with anxiety.  Will continue to monitor patient. Setzer, Don Broach

## 2013-02-15 NOTE — Progress Notes (Signed)
Quick Note:  Katie Silva, Please let her or daughter know that she has H. Pylori infection which is also a cuase of ulcers in addition to Fosamax and aspirin. I would like her to take a course of Pylera. I know it can be expensive but would be best if she can get this. If she cannot let me know - she is PCN allergic so cannot use a regimen with PCN  No recall needed She should have an appointment pending ______

## 2013-02-16 ENCOUNTER — Telehealth: Payer: Self-pay

## 2013-02-16 ENCOUNTER — Other Ambulatory Visit: Payer: Self-pay

## 2013-02-16 MED ORDER — BIS SUBCIT-METRONID-TETRACYC 140-125-125 MG PO CAPS: 3.0000 | ORAL_CAPSULE | Freq: Three times a day (TID) | ORAL | Status: DC

## 2013-02-16 NOTE — Telephone Encounter (Signed)
Debbie said pt needs f/u lab appt for CBC with diff next week per Dr Leone Payor; Eunice Blase said she did not know if pt needed to see Dr Dayton Martes or not; pt has f/u with Dr Leone Payor 03/06/13.Please advise. Debbie said call back 02/20/13 about appt would be OK. Eunice Blase will call back if pt condition changes or worsens.

## 2013-02-17 ENCOUNTER — Telehealth: Payer: Self-pay | Admitting: Family Medicine

## 2013-02-17 LAB — TYPE AND SCREEN
ABO/RH(D): A POS
Antibody Screen: NEGATIVE
Unit division: 0
Unit division: 0

## 2013-02-17 NOTE — Telephone Encounter (Signed)
Yes ok to check labs here.  I can see her after she sees Dr. Leone Payor unless her symptoms change or she wants to see me sooner.

## 2013-02-17 NOTE — Telephone Encounter (Signed)
Patient returning call to office - had asked if Mother could have labs for another physician drawn in the office. Dr Clifton Custard had written reply that labs could be done in office and she would not need to see the patient before her Appt unless symptoms worse.  Transferred caller to Appt desk / Bonita Quin to schedule lab visit.

## 2013-02-17 NOTE — Telephone Encounter (Signed)
Left message asking daughter to call back

## 2013-02-21 NOTE — Telephone Encounter (Signed)
Advised patient's daughter, she has lab appt scheduled.

## 2013-02-23 ENCOUNTER — Other Ambulatory Visit: Payer: Medicare PPO

## 2013-02-24 ENCOUNTER — Other Ambulatory Visit (INDEPENDENT_AMBULATORY_CARE_PROVIDER_SITE_OTHER): Payer: Medicare PPO

## 2013-02-24 DIAGNOSIS — K922 Gastrointestinal hemorrhage, unspecified: Secondary | ICD-10-CM

## 2013-02-25 LAB — CBC WITH DIFFERENTIAL/PLATELET
Basophils Relative: 2 % (ref 0.0–3.0)
Eosinophils Relative: 1 % (ref 0.0–5.0)
Lymphocytes Relative: 28.9 % (ref 12.0–46.0)
Monocytes Relative: 11.1 % (ref 3.0–12.0)
Neutrophils Relative %: 57 % (ref 43.0–77.0)
Platelets: 338 10*3/uL (ref 150.0–400.0)
RBC: 4.03 Mil/uL (ref 3.87–5.11)
WBC: 7.7 10*3/uL (ref 4.5–10.5)

## 2013-02-28 NOTE — Progress Notes (Signed)
Quick Note:  Hgb rising Good news Will discuss more 6/30 ______

## 2013-03-06 ENCOUNTER — Encounter: Payer: Self-pay | Admitting: Internal Medicine

## 2013-03-06 ENCOUNTER — Ambulatory Visit (INDEPENDENT_AMBULATORY_CARE_PROVIDER_SITE_OTHER): Payer: Medicare PPO | Admitting: Internal Medicine

## 2013-03-06 VITALS — BP 156/74 | HR 72 | Ht 62.0 in | Wt 136.1 lb

## 2013-03-06 DIAGNOSIS — D500 Iron deficiency anemia secondary to blood loss (chronic): Secondary | ICD-10-CM

## 2013-03-06 DIAGNOSIS — A048 Other specified bacterial intestinal infections: Secondary | ICD-10-CM

## 2013-03-06 DIAGNOSIS — K2981 Duodenitis with bleeding: Secondary | ICD-10-CM

## 2013-03-06 DIAGNOSIS — K250 Acute gastric ulcer with hemorrhage: Secondary | ICD-10-CM

## 2013-03-06 NOTE — Assessment & Plan Note (Signed)
Lab Results  Component Value Date   HGB 9.4* 02/24/2013   Improved Stay on iron Repeat CBC 1 month

## 2013-03-06 NOTE — Assessment & Plan Note (Addendum)
H. Pylori + - treated She is better Repeat EGD 2-3 months Remain on PPI and off ASA, Fosamax

## 2013-03-06 NOTE — Assessment & Plan Note (Addendum)
Treated with Pylera Need to consider documenting eradication

## 2013-03-06 NOTE — Progress Notes (Signed)
  Subjective:    Patient ID: Katie Silva, female    DOB: Jun 21, 1926, 79 y.o.   MRN: [id number]  HPI The patient returns in followup. I met her in the hospital where she was found to have a blood loss anemia with what I think was acute and subacute and perhaps chronic bleeding as well leading to iron deficiency. She had multiple gastric and duodenal ulcers. She was treated with PPI, aspirin and Fosamax were discontinued. She was found to be H. pylori positive and she is completed treatment with Pylera. Her daughter is with her today. Stools are still dark but there is no constipation or abdominal pain. Stools have been dark while she is on iron. Medications, allergies, past medical history, past surgical history, family history and social history are reviewed and updated in the EMR.   Review of Systems Improving strength    Objective:   Physical Exam Well-developed elderly white woman in no acute distress  Lab Results  Component Value Date   WBC 7.7 02/24/2013   HGB 9.4* 02/24/2013   HCT 30.9* 02/24/2013   MCV 76.5* 02/24/2013   PLT 338.0 02/24/2013     Hemoglobin up from 10.8 at discharge from hospital June 11    Assessment & Plan:   1. Blood loss anemia   2. Gastric ulcer with hemorrhage, acute   3. Multiple duodenal ulcers   4. Helicobacter pylori (H. pylori) infection    Improved overall. See problem oriented charting as well. Plan is to continue PPI, remain off aspirin and Fosamax and have a followup EGD in late August or September. Is not inclined to pursue a colonoscopy and I think we have an adequate cause of her problems determined.

## 2013-03-06 NOTE — Patient Instructions (Addendum)
You have been scheduled for an endoscopy with propofol. Please follow written instructions given to you at your visit today. If you use inhalers (even only as needed), please bring them with you on the day of your procedure.  We want you to get blood work done in a month, the orders are in and you don't need an appointment to come to our lab.  They are open 8-5pm.    I appreciate the opportunity to care for you.

## 2013-03-06 NOTE — Assessment & Plan Note (Signed)
Repeat EGD 2-3 months Stay on PPI

## 2013-03-23 ENCOUNTER — Telehealth: Payer: Self-pay | Admitting: Emergency Medicine

## 2013-03-23 NOTE — Telephone Encounter (Signed)
Patient called; states she would like to cancel her upcoming appointment with Dr Welton Flakes. States she is seeing several other doctors for her "bleeding ulcer" currently. Advised patient to call for any concerns.

## 2013-04-03 ENCOUNTER — Other Ambulatory Visit: Payer: Medicare PPO | Admitting: Lab

## 2013-04-03 ENCOUNTER — Ambulatory Visit: Payer: Self-pay | Admitting: Oncology

## 2013-04-05 ENCOUNTER — Other Ambulatory Visit (INDEPENDENT_AMBULATORY_CARE_PROVIDER_SITE_OTHER): Payer: Medicare PPO

## 2013-04-05 DIAGNOSIS — D500 Iron deficiency anemia secondary to blood loss (chronic): Secondary | ICD-10-CM

## 2013-04-05 LAB — CBC WITH DIFFERENTIAL/PLATELET
Basophils Relative: 0.5 % (ref 0.0–3.0)
Eosinophils Absolute: 0.1 10*3/uL (ref 0.0–0.7)
Eosinophils Relative: 1.5 % (ref 0.0–5.0)
Hemoglobin: 12.8 g/dL (ref 12.0–15.0)
MCHC: 32.1 g/dL (ref 30.0–36.0)
MCV: 80.1 fl (ref 78.0–100.0)
Monocytes Absolute: 0.6 10*3/uL (ref 0.1–1.0)
Neutro Abs: 3.1 10*3/uL (ref 1.4–7.7)
RBC: 4.95 Mil/uL (ref 3.87–5.11)
WBC: 6 10*3/uL (ref 4.5–10.5)

## 2013-04-05 NOTE — Progress Notes (Signed)
Quick Note:  Let her know Hgb normal  It would be ok to restart a baby ASA daily  Stay on other meds and I will discuss more after we recheck ulcers at EGD in September ______

## 2013-05-19 ENCOUNTER — Telehealth: Payer: Self-pay

## 2013-05-19 ENCOUNTER — Encounter: Payer: Self-pay | Admitting: Internal Medicine

## 2013-05-19 ENCOUNTER — Ambulatory Visit (AMBULATORY_SURGERY_CENTER): Payer: Medicare PPO | Admitting: Internal Medicine

## 2013-05-19 VITALS — BP 186/76 | HR 57 | Temp 97.7°F | Resp 22 | Ht 62.0 in | Wt 136.0 lb

## 2013-05-19 DIAGNOSIS — D500 Iron deficiency anemia secondary to blood loss (chronic): Secondary | ICD-10-CM

## 2013-05-19 DIAGNOSIS — K2981 Duodenitis with bleeding: Secondary | ICD-10-CM

## 2013-05-19 DIAGNOSIS — K250 Acute gastric ulcer with hemorrhage: Secondary | ICD-10-CM

## 2013-05-19 MED ORDER — SODIUM CHLORIDE 0.9 % IV SOLN: 500.0000 mL | INTRAVENOUS | Status: DC

## 2013-05-19 MED ORDER — FERROUS SULFATE 325 (65 FE) MG PO TABS: 325.0000 mg | ORAL_TABLET | Freq: Every day | ORAL | Status: DC

## 2013-05-19 MED ORDER — PANTOPRAZOLE SODIUM 40 MG PO TBEC: 40.0000 mg | DELAYED_RELEASE_TABLET | Freq: Every day | ORAL | Status: DC

## 2013-05-19 NOTE — Telephone Encounter (Signed)
Patient advised of urea breath test scheduled at [facility] for 06/06/13 7:45.  She is aware that we will mail her instructions

## 2013-05-19 NOTE — Progress Notes (Signed)
Report to pacu rn, vss, bbs=clear 

## 2013-05-19 NOTE — Patient Instructions (Addendum)
The ulcers are all healed.  I want you to reduce ferrous sulfate (iron) to one pill a day. Stop taking pantoprazole for now. In about 2 weeks we will have you return for a test to make sure the infection in your stomach is gone.  I appreciate the opportunity to care for you. Iva Boop, MD, FACG   YOU HAD AN ENDOSCOPIC PROCEDURE TODAY AT THE [facility]: Refer to the procedure report that was given to you for any specific questions about what was found during the examination.  If the procedure report does not answer your questions, please call your gastroenterologist to clarify.  If you requested that your care partner not be given the details of your procedure findings, then the procedure report has been included in a sealed envelope for you to review at your convenience later.  YOU SHOULD EXPECT: Some feelings of bloating in the abdomen. Passage of more gas than usual.  Walking can help get rid of the air that was put into your GI tract during the procedure and reduce the bloating. If you had a lower endoscopy (such as a colonoscopy or flexible sigmoidoscopy) you may notice spotting of blood in your stool or on the toilet paper. If you underwent a bowel prep for your procedure, then you may not have a normal bowel movement for a few days.  DIET: Your first meal following the procedure should be a light meal and then it is ok to progress to your normal diet.  A half-sandwich or bowl of soup is an example of a good first meal.  Heavy or fried foods are harder to digest and may make you feel nauseous or bloated.  Likewise meals heavy in dairy and vegetables can cause extra gas to form and this can also increase the bloating.  Drink plenty of fluids but you should avoid alcoholic beverages for 24 hours.  ACTIVITY: Your care partner should take you home directly after the procedure.  You should plan to take it easy, moving slowly for the rest of the day.  You can resume normal activity  the day after the procedure however you should NOT DRIVE or use heavy machinery for 24 hours (because of the sedation medicines used during the test).    SYMPTOMS TO REPORT IMMEDIATELY: A gastroenterologist can be reached at any hour.  During normal business hours, 8:30 AM to 5:00 PM Monday through Friday, call [phone].  After hours and on weekends, please call the GI answering service at [phone] who will take a message and have the physician on call contact you.     Following upper endoscopy (EGD)  Vomiting of blood or coffee ground material  New chest pain or pain under the shoulder blades  Painful or persistently difficult swallowing  New shortness of breath  Fever of 100F or higher  Black, tarry-looking stools  FOLLOW UP: If any biopsies were taken you will be contacted by phone or by letter within the next 1-3 weeks.  Call your gastroenterologist if you have not heard about the biopsies in 3 weeks.  Our staff will call the home number listed on your records the next business day following your procedure to check on you and address any questions or concerns that you may have at that time regarding the information given to you following your procedure. This is a courtesy call and so if there is no answer at the home number and we have not heard from you through  the emergency physician on call, we will assume that you have returned to your regular daily activities without incident.  SIGNATURES/CONFIDENTIALITY: You and/or your care partner have signed paperwork which will be entered into your electronic medical record.  These signatures attest to the fact that that the information above on your After Visit Summary has been reviewed and is understood.  Full responsibility of the confidentiality of this discharge information lies with you and/or your care-partner.

## 2013-05-19 NOTE — Op Note (Signed)
[facility] [address] [place], [address]   ENDOSCOPY PROCEDURE REPORT  PATIENT: Katie Silva, Katie Silva  MR#: [id number] BIRTHDATE: Jan 26, 1926 , 87  yrs. old GENDER: Female ENDOSCOPIST: Iva Boop, MD, [facility] PROCEDURE DATE:  05/19/2013 PROCEDURE:  EGD, diagnostic ASA CLASS:     Class III INDICATIONS:  Surveillance.  Document ulcer healing - prior gastric and duodenal ulcers MEDICATIONS: Propofol (Diprivan) 60 mg IV TOPICAL ANESTHETIC: Cetacaine Spray  DESCRIPTION OF PROCEDURE: After the risks benefits and alternatives of the procedure were thoroughly explained, informed consent was obtained.  The LB [id number] endoscope was introduced through the mouth and advanced to the second portion of the duodenum. Without limitations.  The instrument was slowly withdrawn as the mucosa was fully examined.      The upper, middle and distal third of the esophagus were carefully inspected and no abnormalities were noted.  The z-line was well seen at the GEJ.  The endoscope was pushed into the fundus which was normal including a retroflexed view.  The antrum, gastric body, first and second part of the duodenum were unremarkable. Retroflexed views revealed no abnormalities.     The scope was then withdrawn from the patient and the procedure completed.  COMPLICATIONS: There were no complications. ENDOSCOPIC IMPRESSION: Normal EGD - ulcers are healed  RECOMMENDATIONS: Hold pantoprazole My office will arrange urea breath test to document eradication of H.  pylori in about 2 weeks reduce ferrous sulfate to 325 mg daily   eSigned:  Iva Boop, MD, [facility] 05/19/2013 10:42 AM   CC:The Patient

## 2013-05-19 NOTE — Progress Notes (Signed)
Patient did not experience any of the following events: a burn prior to discharge; a fall within the facility; wrong site/side/patient/procedure/implant event; or a hospital transfer or hospital admission upon discharge from the facility. (G8907) Patient did not have preoperative order for IV antibiotic SSI prophylaxis. (G8918)  

## 2013-05-22 ENCOUNTER — Telehealth: Payer: Self-pay | Admitting: *Deleted

## 2013-05-22 NOTE — Telephone Encounter (Signed)
  Follow up Call-  Call back number 05/19/2013  Post procedure Call Back phone  # [phone]  Permission to leave phone message Yes     Patient questions:  Do you have a fever, pain , or abdominal swelling? no Pain Score  0 *  Have you tolerated food without any problems? yes  Have you been able to return to your normal activities? yes  Do you have any questions about your discharge instructions: Diet   no Medications  no Follow up visit  no  Do you have questions or concerns about your Care? no  Actions: * If pain score is 4 or above: No action needed, pain <4.

## 2013-06-06 ENCOUNTER — Encounter (HOSPITAL_COMMUNITY)
Admission: RE | Disposition: A | Discharge status: Home / Self Care | Payer: Self-pay | Source: Ambulatory Visit | Attending: Internal Medicine

## 2013-06-06 ENCOUNTER — Ambulatory Visit (HOSPITAL_COMMUNITY)
Admission: RE | Admit: 2013-06-06 | Discharge: 2013-06-06 | Disposition: A | Discharge status: Home / Self Care | Payer: Medicare PPO | Source: Ambulatory Visit | Attending: Internal Medicine | Admitting: Internal Medicine

## 2013-06-06 DIAGNOSIS — K2970 Gastritis, unspecified, without bleeding: Secondary | ICD-10-CM | POA: Insufficient documentation

## 2013-06-06 HISTORY — PX: BREATH TEK H PYLORI: SHX5422

## 2013-06-06 SURGERY — BREATH TEST, FOR HELICOBACTER PYLORI

## 2013-06-07 ENCOUNTER — Encounter (HOSPITAL_COMMUNITY): Payer: Self-pay | Admitting: Internal Medicine

## 2013-09-11 ENCOUNTER — Encounter: Payer: Self-pay | Admitting: Family Medicine

## 2013-09-11 ENCOUNTER — Ambulatory Visit (INDEPENDENT_AMBULATORY_CARE_PROVIDER_SITE_OTHER): Payer: Medicare PPO | Admitting: Family Medicine

## 2013-09-11 VITALS — BP 162/82 | HR 62 | Temp 97.8°F | Ht 62.5 in | Wt 143.0 lb

## 2013-09-11 DIAGNOSIS — D500 Iron deficiency anemia secondary to blood loss (chronic): Secondary | ICD-10-CM

## 2013-09-11 DIAGNOSIS — I10 Essential (primary) hypertension: Secondary | ICD-10-CM

## 2013-09-11 LAB — BASIC METABOLIC PANEL
BUN: 16 mg/dL (ref 6–23)
CALCIUM: 9.3 mg/dL (ref 8.4–10.5)
CO2: 28 meq/L (ref 19–32)
Chloride: 105 mEq/L (ref 96–112)
Creatinine, Ser: 1 mg/dL (ref 0.4–1.2)
GFR: 55.91 mL/min — ABNORMAL LOW (ref >60.00)
GLUCOSE: 93 mg/dL (ref 70–99)
POTASSIUM: 4.4 meq/L (ref 3.5–5.1)
SODIUM: 140 meq/L (ref 135–145)

## 2013-09-11 LAB — CBC WITH DIFFERENTIAL/PLATELET
Basophils Absolute: 0 10*3/uL (ref 0.0–0.1)
Basophils Relative: 0.3 % (ref 0.0–3.0)
EOS PCT: 0.6 % (ref 0.0–5.0)
Eosinophils Absolute: 0.1 10*3/uL (ref 0.0–0.7)
HEMATOCRIT: 42.4 % (ref 36.0–46.0)
Hemoglobin: 13.7 g/dL (ref 12.0–15.0)
LYMPHS ABS: 2.7 10*3/uL (ref 0.7–4.0)
Lymphocytes Relative: 29.2 % (ref 12.0–46.0)
MCHC: 32.3 g/dL (ref 30.0–36.0)
MCV: 85.2 fl (ref 78.0–100.0)
MONOS PCT: 9.7 % (ref 3.0–12.0)
Monocytes Absolute: 0.9 10*3/uL (ref 0.1–1.0)
Neutro Abs: 5.5 10*3/uL (ref 1.4–7.7)
Neutrophils Relative %: 60.2 % (ref 43.0–77.0)
PLATELETS: 253 10*3/uL (ref 150.0–400.0)
RBC: 4.98 Mil/uL (ref 3.87–5.11)
RDW: 15.7 % — ABNORMAL HIGH (ref 11.5–14.6)
WBC: 9.1 10*3/uL (ref 4.5–10.5)

## 2013-09-11 NOTE — Progress Notes (Signed)
80 yo pleasant female here for follow with her daughter.  HTN- on lisinopril 10 mg daily.  BP elevated here but daughter reports it has been normal at home. Denies HA, blurred vision, CP or SOB.  H/o left breast CA- s/p mastectomy and chemotherapy. Followed by Dr. Chancy Milroy. Had neg mammogram in 11/2010.  PMH of anemia, postoperative and gastric ulcer.  Stopped taking her iron and protonix.  Denies fatigue or black stools.  Lab Results  Component Value Date   WBC 6.0 04/05/2013   HGB 12.8 04/05/2013   HCT 39.7 04/05/2013   MCV 80.1 04/05/2013   PLT 239.0 04/05/2013   Some family has been concerned about her memory but her daughter and pt agree that she is doing well.  She never forgets where she lives or names of people she knows.  Sometimes walks into a room and forgets why she walked into a room.  Per daughter, passed "memory test" done by nurse from [facility].   The PMH, PSH, Social History, Family History, Medications, and allergies have been reviewed in [facility], and have been updated if relevant.  ROS: See HPI Patient reports no  vision/ hearing changes,anorexia, fever ,adenopathy, persistant / recurrent hoarseness, swallowing issues, chest pain, edema,persistant / recurrent cough, hemoptysis, dyspnea(rest, exertional, paroxysmal nocturnal), gastrointestinal  bleeding (melena, rectal bleeding), abdominal pain, excessive heart burn, GU symptoms(dysuria, hematuria, pyuria, voiding/incontinence  Issues) syncope, focal weakness, severe memory loss, concerning skin lesions, depression, anxiety, abnormal bruising/bleeding, major joint swelling, breast masses or abnormal vaginal bleeding.    Physical exam: BP 162/82  Pulse 62  Temp(Src) 97.8 F (36.6 C) (Oral)  Ht 5' 2.5" (1.588 m)  Wt 143 lb (64.864 kg)  BMI 25.72 kg/m2  SpO2 97% Gen:  Alert, very pleasant, appears younger than stated age. HEENT:  Supple, nontender, no adenopathy Dentures appear to fit well Resp:  CTA  bilaterally CVS:  RRR, no MRG Ext:  No edema.  Decreased ROM of right shoulder, no pain.

## 2013-09-11 NOTE — Assessment & Plan Note (Signed)
Component of white coat HTN. Appears to be typically reasonably controlled. Daughter will check again this week and call me with readings. No changes today.

## 2013-09-11 NOTE — Assessment & Plan Note (Signed)
Off iron. Pt denies symptoms. Recheck CBC today. Orders Placed This Encounter  Procedures  . CBC with Differential  . Basic Metabolic Panel

## 2013-09-11 NOTE — Patient Instructions (Signed)
Please call me next week. Ideally, I would like your blood pressure under 150/90.    We will call you with your lab results.

## 2013-09-11 NOTE — Progress Notes (Signed)
Pre-visit discussion using our clinic review tool. No additional management support is needed unless otherwise documented below in the visit note.  

## 2013-09-12 ENCOUNTER — Encounter: Payer: Self-pay | Admitting: *Deleted

## 2013-09-21 ENCOUNTER — Telehealth: Payer: Self-pay | Admitting: *Deleted

## 2013-09-21 ENCOUNTER — Other Ambulatory Visit: Payer: Self-pay | Admitting: Family Medicine

## 2013-09-21 NOTE — Telephone Encounter (Signed)
Lets go ahead and increase her lisinopril to 20 mg daily.  Ok to double up on what she has at home for now (two 10 mg tablets).  Ask her to call us on Monday with her BP- continue to check it daily.

## 2013-09-21 NOTE — Telephone Encounter (Signed)
Patient reports BP readings as requested:   Sunday, January 11th   163/69                                                                          Thursday, January 15   179/76 BP's seem to be fluctuating but the patient is not having any problems.

## 2013-09-21 NOTE — Telephone Encounter (Signed)
Spoke to pt and informed her of instruction. She states that she will continue to monitor BP and contact us on Monday with most recent readings.

## 2013-11-14 ENCOUNTER — Encounter: Payer: Self-pay | Admitting: Family Medicine

## 2013-11-28 ENCOUNTER — Ambulatory Visit: Payer: Medicare PPO

## 2013-11-28 ENCOUNTER — Ambulatory Visit (INDEPENDENT_AMBULATORY_CARE_PROVIDER_SITE_OTHER): Payer: Medicare PPO | Admitting: Family Medicine

## 2013-11-28 VITALS — BP 140/78 | HR 58 | Temp 97.5°F | Resp 16 | Ht 62.5 in | Wt 144.4 lb

## 2013-11-28 DIAGNOSIS — M25569 Pain in unspecified knee: Principal | ICD-10-CM

## 2013-11-28 DIAGNOSIS — L089 Local infection of the skin and subcutaneous tissue, unspecified: Secondary | ICD-10-CM

## 2013-11-28 DIAGNOSIS — G8929 Other chronic pain: Secondary | ICD-10-CM

## 2013-11-28 DIAGNOSIS — M79609 Pain in unspecified limb: Secondary | ICD-10-CM

## 2013-11-28 DIAGNOSIS — S60559A Superficial foreign body of unspecified hand, initial encounter: Secondary | ICD-10-CM

## 2013-11-28 MED ORDER — DICLOFENAC SODIUM 1 % TD GEL: 2.0000 g | Freq: Four times a day (QID) | TRANSDERMAL | Status: DC

## 2013-11-28 MED ORDER — DOXYCYCLINE HYCLATE 100 MG PO TABS: 100.0000 mg | ORAL_TABLET | Freq: Two times a day (BID) | ORAL | Status: DC

## 2013-11-28 NOTE — Progress Notes (Signed)
80 yo woman with splinter in left thenar eminence x 9 months with recent increased redness.  Also complains of right knee pain intermittently, worse x  2 months with some catching.  Objective:  NAD Left thumb base swollen in thenar eminence:   Cleansed with isopropyl alcohol  I&D'd with splinter removal after 1% xylo  Mild vagal episode  Large deep splinter  Right knee with moderate medial knee joint line synovial thickening and tenderness.  No bony abnormality  [facility] reading (PRIMARY) by  Dr. Joseph Art:  osteoarthritis right knee.  Assessment:  FB left hand, removed; knee arthritis  Knee pain, chronic - Plan: DG Knee 1-2 Views Right, diclofenac sodium (VOLTAREN) 1 % GEL  Splinter of hand without major open wound with infection - Plan: DG Knee 1-2 Views Right, doxycycline (VIBRA-TABS) 100 MG tablet, DISCONTINUED: doxycycline (VIBRA-TABS) 100 MG tablet  Signed, Robyn Haber, MD

## 2013-12-22 ENCOUNTER — Other Ambulatory Visit: Payer: Self-pay | Admitting: Family Medicine

## 2013-12-27 ENCOUNTER — Other Ambulatory Visit: Payer: Self-pay | Admitting: Family Medicine

## 2014-02-16 ENCOUNTER — Other Ambulatory Visit: Payer: Self-pay | Admitting: *Deleted

## 2014-02-16 MED ORDER — LISINOPRIL 10 MG PO TABS: ORAL_TABLET | ORAL | Status: DC

## 2014-02-16 NOTE — Addendum Note (Signed)
Addended by: Modena Nunnery on: 02/16/2014 04:40 PM   Modules accepted: Orders

## 2014-02-26 ENCOUNTER — Other Ambulatory Visit: Payer: Self-pay | Admitting: Family Medicine

## 2014-03-05 ENCOUNTER — Other Ambulatory Visit: Payer: Self-pay | Admitting: *Deleted

## 2014-03-05 MED ORDER — LISINOPRIL 10 MG PO TABS: ORAL_TABLET | ORAL | Status: DC

## 2014-03-20 ENCOUNTER — Telehealth: Payer: Self-pay

## 2014-03-20 DIAGNOSIS — M25569 Pain in unspecified knee: Secondary | ICD-10-CM

## 2014-03-20 NOTE — Telephone Encounter (Signed)
pts daughter, [name] left v/m; pts knee has been bothering pt with build up of fluid in the knee; request referral to [facility]. Linda request cb.Please advise.

## 2014-03-20 NOTE — Telephone Encounter (Signed)
Referral placed.

## 2014-06-05 ENCOUNTER — Encounter: Payer: Self-pay | Admitting: Family Medicine

## 2014-06-05 ENCOUNTER — Ambulatory Visit (INDEPENDENT_AMBULATORY_CARE_PROVIDER_SITE_OTHER): Payer: Medicare PPO | Admitting: Family Medicine

## 2014-06-05 VITALS — BP 144/86 | HR 67 | Temp 98.0°F | Wt 132.2 lb

## 2014-06-05 DIAGNOSIS — R195 Other fecal abnormalities: Secondary | ICD-10-CM

## 2014-06-05 DIAGNOSIS — W19XXXA Unspecified fall, initial encounter: Secondary | ICD-10-CM | POA: Insufficient documentation

## 2014-06-05 DIAGNOSIS — Z23 Encounter for immunization: Secondary | ICD-10-CM

## 2014-06-05 DIAGNOSIS — K921 Melena: Secondary | ICD-10-CM

## 2014-06-05 DIAGNOSIS — Y92009 Unspecified place in unspecified non-institutional (private) residence as the place of occurrence of the external cause: Secondary | ICD-10-CM

## 2014-06-05 LAB — CBC WITH DIFFERENTIAL/PLATELET
BASOS PCT: 0.4 % (ref 0.0–3.0)
Basophils Absolute: 0 10*3/uL (ref 0.0–0.1)
EOS PCT: 0.7 % (ref 0.0–5.0)
Eosinophils Absolute: 0.1 10*3/uL (ref 0.0–0.7)
HEMATOCRIT: 39.7 % (ref 36.0–46.0)
Hemoglobin: 12.8 g/dL (ref 12.0–15.0)
LYMPHS ABS: 1.9 10*3/uL (ref 0.7–4.0)
Lymphocytes Relative: 21 % (ref 12.0–46.0)
MCHC: 32.2 g/dL (ref 30.0–36.0)
MCV: 86.5 fl (ref 78.0–100.0)
MONO ABS: 0.9 10*3/uL (ref 0.1–1.0)
Monocytes Relative: 9.6 % (ref 3.0–12.0)
Neutro Abs: 6.3 10*3/uL (ref 1.4–7.7)
Neutrophils Relative %: 68.3 % (ref 43.0–77.0)
PLATELETS: 274 10*3/uL (ref 150.0–400.0)
RBC: 4.59 Mil/uL (ref 3.87–5.11)
RDW: 16.1 % — ABNORMAL HIGH (ref 11.5–15.5)
WBC: 9.2 10*3/uL (ref 4.0–10.5)

## 2014-06-05 MED ORDER — PANTOPRAZOLE SODIUM 40 MG PO TBEC: 40.0000 mg | DELAYED_RELEASE_TABLET | Freq: Every day | ORAL | Status: DC

## 2014-06-05 NOTE — Progress Notes (Signed)
80 yo very pleasant female here with daughter for several concerns.   Black stools- h/o upper GIB last year- was hospitalized. Noticed that stools have been black again for past 4 to 6 months.  Sometimes loose but typically normal consistency.  No BRB in stool.  Some mild nausea, no vomiting.  Has noticed some increased fatigue.  No longer taking NSAIDs. Has been under more stress- son in law just died of CA. + DOE Wt Readings from Last 3 Encounters:  06/05/14 132 lb 4 oz (59.988 kg)  11/28/13 144 lb 6.4 oz (65.499 kg)  09/11/13 143 lb (64.864 kg)   Endoscopy in 62014 confirmed PUD and H pylori. Follow up endo done on 06/08/13- showed resolution.  Lab Results  Component Value Date   WBC 9.1 09/11/2013   HGB 13.7 09/11/2013   HCT 42.4 09/11/2013   MCV 85.2 09/11/2013   PLT 253.0 09/11/2013    Fell at home on 9/11.  Tripped over a piece of carpet and landed on her left hip- this is the hip she had replaced. Went to [facility] ortho- hip and shoulder films neg per pt and her daughter. Hematoma on hip is decreasing in size. No longer painful.  H/o left breast CA- s/p mastectomy and chemotherapy.   Patient Active Problem List   Diagnosis Date Noted  . Black stool 06/05/2014  . Fall at home 06/05/2014  . Helicobacter pylori (H. pylori) infection 02/15/2013  . Blood loss anemia 02/13/2013  . Postoperative anemia 04/14/2012  . SVT (supraventricular tachycardia) 04/14/2012  . Closed left hip fracture 04/13/2012  . HTN (hypertension) 01/30/2011  . Breast cancer, left breast   . Osteoporosis   . Fracture of humerus, proximal, right, closed 07/08/2010   Past Medical History  Diagnosis Date  . Breast cancer, left breast 2003    s/p mastectomy, chemo.  Dr. Chancy Milroy oncologist  . Fracture of humerus, proximal, right, closed 07/2010    s/p hemiarthroplasty  . Osteoporosis   . Hypertension   . Anemia   . Arthritis     hands, knees  . Gastric ulcer with hemorrhage 02/14/2013  . Multiple duodenal  ulcers 02/14/2013  . Helicobacter pylori (H. pylori) infection 02/15/2013   Past Surgical History  Procedure Laterality Date  . Shoulder hemi-arthroplasty Right 07/2010    Dr. Mardelle Matte  . Abdominal hysterectomy    . Knee arthroscopy Bilateral     [facility]  . Femur im nail  04/13/2012    Procedure: INTRAMEDULLARY (IM) NAIL FEMORAL;  Surgeon: Melina Schools, MD;  Location: [facility];  Service: Orthopedics;  Laterality: Left;  . Esophagogastroduodenoscopy N/A 02/14/2013    Procedure: ESOPHAGOGASTRODUODENOSCOPY (EGD);  Surgeon: Gatha Mayer, MD;  Location: [facility];  Service: Endoscopy;  Laterality: N/A;  . Mastectomy Left   . Breath tek h pylori N/A 06/06/2013    Procedure: BREATH TEK H PYLORI;  Surgeon: Gatha Mayer, MD;  Location: [facility];  Service: Endoscopy;  Laterality: N/A;  . Hip surgery     History  Substance Use Topics  . Smoking status: Never Smoker   . Smokeless tobacco: Never Used  . Alcohol Use: No   Family History  Problem Relation Age of Onset  . Myasthenia gravis Mother   . Coronary artery disease Father   . Colon cancer Sister    Allergies  Allergen Reactions  . Fexofenadine Hcl Other (See Comments)    Doesn't agree with patient  . Penicillins   . Tylenol [Acetaminophen]     "  funny feeling in head."   Current Outpatient Prescriptions on File Prior to Visit  Medication Sig Dispense Refill  . Calcium Carbonate-Vitamin D (CALTRATE 600+D) 600-400 MG-UNIT per tablet Take 1 tablet by mouth 2 (two) times daily.       . diclofenac sodium (VOLTAREN) 1 % GEL Apply 2 g topically 4 (four) times daily.  100 g  3  . doxycycline (VIBRA-TABS) 100 MG tablet Take 1 tablet (100 mg total) by mouth 2 (two) times daily.  20 tablet  0  . glucosamine-chondroitin 500-400 MG tablet Take 2 tablets by mouth daily.       [name] L-Lysine 500 MG CAPS Take 1 capsule by mouth daily.        [name] lisinopril (PRINIVIL,ZESTRIL) 10 MG tablet TAKE 1 TABLET BY MOUTH EVERY DAY  90 tablet   0  . Misc Natural Products (APPLE CIDER VINEGAR DIET PO) Take 4 oz by mouth daily as needed. As needed for pain. Mix 16 oz grape juice,  4 oz apple cider vinegar, 16 oz apple juice, and honey to taste.  Drink 4 ounces daily.      . vitamin C (ASCORBIC ACID) 500 MG tablet Take 500 mg by mouth daily.         No current facility-administered medications on file prior to visit.    The PMH, PSH, Social History, Family History, Medications, and allergies have been reviewed in [facility], and have been updated if relevant.  ROS: See HPI No vomiting No CP + SOB No LE edema No HA  Physical exam: BP 144/86  Pulse 67  Temp(Src) 98 F (36.7 C) (Oral)  Wt 132 lb 4 oz (59.988 kg)  SpO2 97% Wt Readings from Last 3 Encounters:  06/05/14 132 lb 4 oz (59.988 kg)  11/28/13 144 lb 6.4 oz (65.499 kg)  09/11/13 143 lb (64.864 kg)   Gen:  Alert, very pleasant, appears fatigued today. HEENT:  Supple, nontender, no adenopathy Dentures appear to fit well Resp:  CTA bilaterally CVS:  RRR, no MRG Ext:  No edema.   Left hip- large hematoma, NTTP

## 2014-06-05 NOTE — Progress Notes (Signed)
Pre visit review using our clinic review tool, if applicable. No additional management support is needed unless otherwise documented below in the visit note. 

## 2014-06-05 NOTE — Assessment & Plan Note (Signed)
New- feeling better. Reassuring scan with ortho.

## 2014-06-05 NOTE — Assessment & Plan Note (Signed)
Deteriorated. Rectal exam not done today--4-6 months of black stools is likely due to recurrent PUD/upper GIB. Restart protonix 40 mg daily. CBC today- will likely need to restart iron, possible transfusion if severe. Refer back to GI.

## 2014-06-13 ENCOUNTER — Ambulatory Visit: Payer: Medicare PPO | Admitting: Internal Medicine

## 2014-06-13 ENCOUNTER — Encounter: Payer: Self-pay | Admitting: Internal Medicine

## 2014-06-13 ENCOUNTER — Ambulatory Visit (INDEPENDENT_AMBULATORY_CARE_PROVIDER_SITE_OTHER): Payer: Medicare PPO | Admitting: Internal Medicine

## 2014-06-13 VITALS — BP 128/70 | HR 64 | Ht 62.25 in | Wt 132.1 lb

## 2014-06-13 DIAGNOSIS — K921 Melena: Secondary | ICD-10-CM

## 2014-06-13 DIAGNOSIS — B9681 Helicobacter pylori [H. pylori] as the cause of diseases classified elsewhere: Secondary | ICD-10-CM

## 2014-06-13 DIAGNOSIS — Z8719 Personal history of other diseases of the digestive system: Secondary | ICD-10-CM

## 2014-06-13 DIAGNOSIS — A048 Other specified bacterial intestinal infections: Secondary | ICD-10-CM

## 2014-06-13 DIAGNOSIS — Z8711 Personal history of peptic ulcer disease: Secondary | ICD-10-CM

## 2014-06-13 NOTE — Assessment & Plan Note (Signed)
UBT 05/2013 shows eradication

## 2014-06-13 NOTE — Patient Instructions (Signed)
Stay on your pantoprazole forever.   I appreciate the opportunity to care for you.

## 2014-06-13 NOTE — Progress Notes (Signed)
   Subjective:    Patient ID: Katie Silva, female    DOB: 01-Jul-1926, 80 y.o.   MRN: [id number]  HPI The patient is here with her daughter after seeing PCP because of intermittent black stools. Hgb was NL and same when checked last week. Started back on PPI which had been held for UBT (negative) after H. Pylori eradication - and never restarted. Feels ok today. No pain. No oral NSAID's.  Medications, allergies, past medical history, past surgical history, family history and social history are reviewed and updated in the EMR.  Review of Systems As above + struck left hip and had hematoma       Objective:   Physical Exam WDWN elderly ww NAD abd soft and NT BS + Rectal w/ female staff present shows heme neg brown stool Left hip with egg-sized hematoma lateral A and o x 3      Assessment & Plan:  Black stools  History of gastric ulcer H. Pylori + - Tx end eradicated   1) Seems ok now - cannot tell if she had bleeding 2) See me prn 3) we have decided to stay on PPI chronically  I appreciate the opportunity to care for this patient. ML:JQGBE Deborra Medina, MD

## 2014-06-29 ENCOUNTER — Other Ambulatory Visit: Payer: Self-pay | Admitting: *Deleted

## 2014-06-29 MED ORDER — LISINOPRIL 10 MG PO TABS: ORAL_TABLET | ORAL | Status: DC

## 2014-08-09 ENCOUNTER — Ambulatory Visit: Payer: Medicare PPO | Admitting: Internal Medicine

## 2014-08-30 ENCOUNTER — Other Ambulatory Visit: Payer: Self-pay | Admitting: Family Medicine

## 2014-11-03 ENCOUNTER — Other Ambulatory Visit: Payer: Self-pay | Admitting: Family Medicine

## 2014-12-13 DIAGNOSIS — H40013 Open angle with borderline findings, low risk, bilateral: Secondary | ICD-10-CM | POA: Diagnosis not present

## 2015-01-04 ENCOUNTER — Other Ambulatory Visit: Payer: Self-pay | Admitting: Family Medicine

## 2015-03-03 DIAGNOSIS — E663 Overweight: Secondary | ICD-10-CM | POA: Diagnosis not present

## 2015-03-03 DIAGNOSIS — K219 Gastro-esophageal reflux disease without esophagitis: Secondary | ICD-10-CM | POA: Diagnosis not present

## 2015-03-03 DIAGNOSIS — Z6825 Body mass index (BMI) 25.0-25.9, adult: Secondary | ICD-10-CM | POA: Diagnosis not present

## 2015-03-03 DIAGNOSIS — I10 Essential (primary) hypertension: Secondary | ICD-10-CM | POA: Diagnosis not present

## 2015-03-05 ENCOUNTER — Other Ambulatory Visit: Payer: Self-pay | Admitting: Family Medicine

## 2015-03-12 DIAGNOSIS — X32XXXD Exposure to sunlight, subsequent encounter: Secondary | ICD-10-CM | POA: Diagnosis not present

## 2015-03-12 DIAGNOSIS — L570 Actinic keratosis: Secondary | ICD-10-CM | POA: Diagnosis not present

## 2015-03-12 DIAGNOSIS — D0439 Carcinoma in situ of skin of other parts of face: Secondary | ICD-10-CM | POA: Diagnosis not present

## 2015-03-12 DIAGNOSIS — C44311 Basal cell carcinoma of skin of nose: Secondary | ICD-10-CM | POA: Diagnosis not present

## 2015-03-14 ENCOUNTER — Ambulatory Visit (INDEPENDENT_AMBULATORY_CARE_PROVIDER_SITE_OTHER): Payer: Medicare PPO | Admitting: Family Medicine

## 2015-03-14 ENCOUNTER — Encounter: Payer: Self-pay | Admitting: Family Medicine

## 2015-03-14 VITALS — BP 142/88 | HR 67 | Temp 97.6°F | Wt 137.5 lb

## 2015-03-14 DIAGNOSIS — I10 Essential (primary) hypertension: Secondary | ICD-10-CM

## 2015-03-14 DIAGNOSIS — F0390 Unspecified dementia without behavioral disturbance: Secondary | ICD-10-CM

## 2015-03-14 MED ORDER — DONEPEZIL HCL 5 MG PO TABS: 5.0000 mg | ORAL_TABLET | Freq: Every day | ORAL | Status: DC

## 2015-03-14 NOTE — Assessment & Plan Note (Signed)
Mild- progressive. >25 minutes spent in face to face time with patient, >50% spent in counselling or coordination of care Start aricept 5 mg daily.  Her daughter will continue to keep an eye on her and keep me updated.

## 2015-03-14 NOTE — Progress Notes (Signed)
Pre visit review using our clinic review tool, if applicable. No additional management support is needed unless otherwise documented below in the visit note. 

## 2015-03-14 NOTE — Patient Instructions (Signed)
Good to see you. We are starting Aricept 5 mg daily.  Please call me in a month with an update.

## 2015-03-14 NOTE — Progress Notes (Signed)
Subjective:   Patient ID: Katie Silva, female    DOB: [birth year], 81 y.o.   MRN: [id number]  Katie Silva is a pleasant 81 y.o. year old female who presents to clinic today with her two daughters for Follow-up and Memory Loss  on 03/14/2015  HPI:  HTN- [employer] nurse came out to her home and BP was 160/70 and MMSE was "abnormal."  Advised to follow up with me here today. BP improved today. Per pt, she had not taken her rxs that day and was anxious that someone new was in her home. Denies any CP, SOB, blurred vision or LE edema.  Memory loss- daughter feel this has progressed over past year.  They are repeating things they told her 10 or 15 minutes prior.  She is very independent- takes care of all of her own ADLs, lives on her own but daughters come every day to bring her a meal and check on her.  Forgetting names as well.  They feel her long term memory is good.  She denies feeling depressed or anxious.  No behavioral issues.  Current Outpatient Prescriptions on File Prior to Visit  Medication Sig Dispense Refill  . Calcium Carbonate-Vitamin D (CALTRATE 600+D) 600-400 MG-UNIT per tablet Take 1 tablet by mouth 2 (two) times daily.     . diclofenac sodium (VOLTAREN) 1 % GEL Apply 2 g topically 4 (four) times daily. 100 g 3  . glucosamine-chondroitin 500-400 MG tablet Take 2 tablets by mouth daily.     [name] L-Lysine 500 MG CAPS Take 1 capsule by mouth daily.      [name] lisinopril (PRINIVIL,ZESTRIL) 10 MG tablet TAKE 1 TABLET EVERY DAY  (APPOINTMENT IS NEEDED  FOR  MORE  REFILLS) 90 tablet 0  . Misc Natural Products (APPLE CIDER VINEGAR DIET PO) Take 4 oz by mouth daily as needed. As needed for pain. Mix 16 oz grape juice,  4 oz apple cider vinegar, 16 oz apple juice, and honey to taste.  Drink 4 ounces daily.    . pantoprazole (PROTONIX) 40 MG tablet Take 1 tablet (40 mg total) by mouth daily. 30 tablet 3  . vitamin C (ASCORBIC ACID) 500 MG tablet Take 500 mg by mouth daily.       No  current facility-administered medications on file prior to visit.    Allergies  Allergen Reactions  . Fexofenadine Hcl Other (See Comments)    Doesn't agree with patient  . Penicillins   . Tylenol [Acetaminophen]     "funny feeling in head."    Past Medical History  Diagnosis Date  . Breast cancer, left breast 2003    s/p mastectomy, chemo.  Dr. Chancy Milroy oncologist  . Fracture of humerus, proximal, right, closed 07/2010    s/p hemiarthroplasty  . Osteoporosis   . Hypertension   . Anemia   . Arthritis     hands, knees  . Gastric ulcer with hemorrhage 02/14/2013  . Multiple duodenal ulcers 02/14/2013  . Helicobacter pylori (H. pylori) infection 02/15/2013    eradication confirmed 05/2013 UBT    Past Surgical History  Procedure Laterality Date  . Shoulder hemi-arthroplasty Right 07/2010    Dr. Mardelle Matte  . Abdominal hysterectomy    . Knee arthroscopy Bilateral     [facility]  . Femur im nail  04/13/2012    Procedure: INTRAMEDULLARY (IM) NAIL FEMORAL;  Surgeon: Melina Schools, MD;  Location: [facility];  Service: Orthopedics;  Laterality: Left;  . Esophagogastroduodenoscopy  N/A 02/14/2013    Procedure: ESOPHAGOGASTRODUODENOSCOPY (EGD);  Surgeon: Gatha Mayer, MD;  Location: [facility];  Service: Endoscopy;  Laterality: N/A;  . Mastectomy Left   . Breath tek h pylori N/A 06/06/2013    Procedure: BREATH TEK H PYLORI;  Surgeon: Gatha Mayer, MD;  Location: [facility];  Service: Endoscopy;  Laterality: N/A;  . Hip surgery      Family History  Problem Relation Age of Onset  . Myasthenia gravis Mother   . Coronary artery disease Father   . Colon cancer Sister     History   Social History  . Marital Status: Widowed    Spouse Name: N/A  . Number of Children: 6  . Years of Education: N/A   Occupational History  . retired    Social History Main Topics  . Smoking status: Never Smoker   . Smokeless tobacco: Never Used  . Alcohol Use: No  . Drug Use: No  . Sexual  Activity: Not Currently   Other Topics Concern  . Not on file   Social History Narrative   Lives alone but daughter lives behind her and very involved in her care.   Independent for all ADLs.   The PMH, PSH, Social History, Family History, Medications, and allergies have been reviewed in [facility], and have been updated if relevant.   Review of Systems  Constitutional: Negative.   Eyes: Negative.   Respiratory: Negative.   Cardiovascular: Negative.   Gastrointestinal: Negative.   Endocrine: Negative.   Genitourinary: Negative.   Musculoskeletal: Negative.   Skin: Negative.   Neurological: Negative for tremors, seizures, syncope, speech difficulty, weakness, light-headedness, numbness and headaches.  Psychiatric/Behavioral: Negative for suicidal ideas, hallucinations, behavioral problems, confusion, sleep disturbance, self-injury, dysphoric mood, decreased concentration and agitation. The patient is not nervous/anxious and is not hyperactive.   All other systems reviewed and are negative.      Objective:    BP 142/88 mmHg  Pulse 67  Temp(Src) 97.6 F (36.4 C) (Oral)  Wt 137 lb 8 oz (62.37 kg)  SpO2 97%   Physical Exam  Constitutional: She is oriented to person, place, and time. She appears well-developed and well-nourished. No distress.  HENT:  Head: Normocephalic.  Eyes: Conjunctivae are normal.  Neck: Normal range of motion.  Cardiovascular: Normal rate.   Pulmonary/Chest: Effort normal.  Musculoskeletal: Normal range of motion. She exhibits no edema.  Neurological: She is alert and oriented to person, place, and time. No cranial nerve deficit.  Skin: Skin is warm and dry.  Psychiatric: She has a normal mood and affect. Her behavior is normal. Judgment and thought content normal.  Nursing note and vitals reviewed.         Assessment & Plan:   Essential hypertension  Dementia, without behavioral disturbance No Follow-up on file.

## 2015-03-14 NOTE — Assessment & Plan Note (Signed)
Reasonable control today.  Agrees to be more compliant with taking her rxs.

## 2015-04-03 ENCOUNTER — Telehealth: Payer: Self-pay | Admitting: Family Medicine

## 2015-04-03 NOTE — Telephone Encounter (Signed)
Lm on Linda's vm requesting a call back

## 2015-04-03 NOTE — Telephone Encounter (Signed)
[name] returned your call [phone]

## 2015-04-03 NOTE — Telephone Encounter (Signed)
[name] called to let you know how Katie Silva is doing on the new meds Katie Lauf is not doing very well with it.  Taking 2 1/2 - 3 weeks.  She is having bad dreams and feels like she is running in her dreams She called [name] @ 12:30 Friday night with bad dream  And thought she was having a heart attack [name] got her calms down and she was ok after that Since Sunday she has not been taking the pills any more.  She has not has any bad dreams since. That's what Katie Hink tells [name].  What would you recommend what to do next

## 2015-04-03 NOTE — Telephone Encounter (Signed)
Noted and I am sorry to hear that- please add aricept to allergy and let [name] know that I personally do not have any other rx I can place her on for mild dementia as that would be outside my scope of practice.  We could certainly refer her to specialist (a neurologist) if she would like further evaluation and possibly other treatment options.

## 2015-04-03 NOTE — Telephone Encounter (Signed)
Spoke to [place] and advised regarding referral. [name] states that she will speak with her siblings and contact us back with a decision.

## 2015-04-03 NOTE — Telephone Encounter (Signed)
Noted! Thank you

## 2015-04-26 DIAGNOSIS — Z1231 Encounter for screening mammogram for malignant neoplasm of breast: Secondary | ICD-10-CM | POA: Diagnosis not present

## 2015-04-29 ENCOUNTER — Encounter: Payer: Self-pay | Admitting: Family Medicine

## 2015-07-01 ENCOUNTER — Other Ambulatory Visit: Payer: Self-pay | Admitting: Family Medicine

## 2015-07-18 DIAGNOSIS — H35032 Hypertensive retinopathy, left eye: Secondary | ICD-10-CM | POA: Diagnosis not present

## 2015-07-18 DIAGNOSIS — H01003 Unspecified blepharitis right eye, unspecified eyelid: Secondary | ICD-10-CM | POA: Diagnosis not present

## 2015-07-18 DIAGNOSIS — H43813 Vitreous degeneration, bilateral: Secondary | ICD-10-CM | POA: Diagnosis not present

## 2015-07-18 DIAGNOSIS — H40013 Open angle with borderline findings, low risk, bilateral: Secondary | ICD-10-CM | POA: Diagnosis not present

## 2015-07-18 DIAGNOSIS — H35033 Hypertensive retinopathy, bilateral: Secondary | ICD-10-CM | POA: Diagnosis not present

## 2015-07-18 DIAGNOSIS — Z961 Presence of intraocular lens: Secondary | ICD-10-CM | POA: Diagnosis not present

## 2015-07-18 DIAGNOSIS — H35031 Hypertensive retinopathy, right eye: Secondary | ICD-10-CM | POA: Diagnosis not present

## 2015-09-02 ENCOUNTER — Encounter: Payer: Self-pay | Admitting: Podiatry

## 2015-09-02 ENCOUNTER — Ambulatory Visit (INDEPENDENT_AMBULATORY_CARE_PROVIDER_SITE_OTHER): Payer: Medicare PPO | Admitting: Podiatry

## 2015-09-02 VITALS — BP 138/78 | HR 70 | Resp 16 | Ht 64.0 in | Wt 135.0 lb

## 2015-09-02 DIAGNOSIS — M779 Enthesopathy, unspecified: Secondary | ICD-10-CM

## 2015-09-02 DIAGNOSIS — L84 Corns and callosities: Secondary | ICD-10-CM | POA: Diagnosis not present

## 2015-09-02 DIAGNOSIS — M79675 Pain in left toe(s): Secondary | ICD-10-CM

## 2015-09-02 DIAGNOSIS — M2042 Other hammer toe(s) (acquired), left foot: Secondary | ICD-10-CM | POA: Diagnosis not present

## 2015-09-02 DIAGNOSIS — M79674 Pain in right toe(s): Secondary | ICD-10-CM | POA: Diagnosis not present

## 2015-09-02 MED ORDER — TRIAMCINOLONE ACETONIDE 10 MG/ML IJ SUSP
10.0000 mg | Freq: Once | INTRAMUSCULAR | Status: AC
  Administered 2015-09-02: 10 mg

## 2015-09-02 NOTE — Progress Notes (Signed)
   Subjective:    Patient ID: Katie Silva, female    DOB: [birth year], 81 y.o.   MRN: [id number]  HPI Patient presents with a corn on her left foot, 2nd toe-top of toe. Pt's daughter stated, "if puts on closed-toe shoes, toe gets red and hurts". This has been going on for the past year.   Review of Systems  All other systems reviewed and are negative.      Objective:   Physical Exam        Assessment & Plan:

## 2015-09-02 NOTE — Progress Notes (Signed)
Subjective:     Patient ID: Katie Silva, female   DOB: 09-07-1926, 81 y.o.   MRN: [id number]  HPI patient presents with caregiver stating that the second toe left has been quite sore and making it hard for her to wear shoe gear or to ambulate properly. States that it's gotten worse over the last year   Review of Systems  All other systems reviewed and are negative.      Objective:   Physical Exam  Constitutional: She is oriented to person, place, and time.  Cardiovascular: Intact distal pulses.   Musculoskeletal: Normal range of motion.  Neurological: She is oriented to person, place, and time.  Skin: Skin is warm.  Nursing note and vitals reviewed.  neurovascular status found to be intact muscle strength adequate range of motion within normal limits with patient noted to have mild edema in the forefoot and rigid contracture second digit left with keratotic lesion at the interphalangeal joint with fluid buildup and pain when palpated. There is no drainage occurring currently and patient's noted to have good digital perfusion and is well oriented 3     Assessment:     Rigid hammertoe deformity second left with fluid buildup and keratotic lesion formation    Plan:     H&P and condition reviewed with patient and family. Today proximal nerve block was administered and after appropriate numbness I did go ahead and injected a small amount of dexamethasone Kenalog at the interphalangeal joint and then did debridement of lesion applying padding. Instructed to return when it reoccurs and she may return to normal shoe gear

## 2015-10-15 DIAGNOSIS — Z96641 Presence of right artificial hip joint: Secondary | ICD-10-CM | POA: Diagnosis not present

## 2015-10-15 DIAGNOSIS — Z9842 Cataract extraction status, left eye: Secondary | ICD-10-CM | POA: Diagnosis not present

## 2015-10-15 DIAGNOSIS — I10 Essential (primary) hypertension: Secondary | ICD-10-CM | POA: Diagnosis not present

## 2015-10-15 DIAGNOSIS — Z96611 Presence of right artificial shoulder joint: Secondary | ICD-10-CM | POA: Diagnosis not present

## 2015-10-15 DIAGNOSIS — Z6823 Body mass index (BMI) 23.0-23.9, adult: Secondary | ICD-10-CM | POA: Diagnosis not present

## 2015-10-15 DIAGNOSIS — Z9841 Cataract extraction status, right eye: Secondary | ICD-10-CM | POA: Diagnosis not present

## 2015-11-11 ENCOUNTER — Other Ambulatory Visit: Payer: Self-pay | Admitting: Family Medicine

## 2015-12-31 ENCOUNTER — Encounter: Payer: Self-pay | Admitting: Family Medicine

## 2015-12-31 ENCOUNTER — Ambulatory Visit (INDEPENDENT_AMBULATORY_CARE_PROVIDER_SITE_OTHER): Payer: Medicare PPO | Admitting: Family Medicine

## 2015-12-31 ENCOUNTER — Ambulatory Visit: Payer: Medicare PPO

## 2015-12-31 VITALS — BP 158/82 | HR 72 | Temp 97.9°F | Wt 141.5 lb

## 2015-12-31 DIAGNOSIS — R600 Localized edema: Secondary | ICD-10-CM | POA: Diagnosis not present

## 2015-12-31 NOTE — Progress Notes (Signed)
Subjective:   Patient ID: Katie Silva, female    DOB: [birth year], 82 y.o.   MRN: [id number]  Katie Silva is a pleasant 82 y.o. year old female who presents to clinic today with her daughter for Foot Swelling  on 12/31/2015  HPI:  Today is her 82th birthday!  Intermittent bilateral LE edema for months, left often greater than right.  She has been walking almost a mile a day.  Swelling seems worse when she is on her feet more.  Denies any leg pain. No weeping from her legs.  No CP or SOB.  Often skips her BP rx. Tries not to take too many medications.  No known injury.  Current Outpatient Prescriptions on File Prior to Visit  Medication Sig Dispense Refill  . Calcium Carbonate-Vitamin D (CALTRATE 600+D) 600-400 MG-UNIT per tablet Take 1 tablet by mouth 2 (two) times daily.     [name] L-Lysine 500 MG CAPS Take 1 capsule by mouth daily.      [name] lisinopril (PRINIVIL,ZESTRIL) 10 MG tablet TAKE 1 TABLET EVERY DAY 90 tablet 0  . Misc Natural Products (APPLE CIDER VINEGAR DIET PO) Take 4 oz by mouth daily as needed. As needed for pain. Mix 16 oz grape juice,  4 oz apple cider vinegar, 16 oz apple juice, and honey to taste.  Drink 4 ounces daily.    . vitamin C (ASCORBIC ACID) 500 MG tablet Take 500 mg by mouth daily.      . diclofenac sodium (VOLTAREN) 1 % GEL Apply 2 g topically 4 (four) times daily. (Patient not taking: Reported on 12/31/2015) 100 g 3  . donepezil (ARICEPT) 5 MG tablet Take 1 tablet (5 mg total) by mouth at bedtime. (Patient not taking: Reported on 12/31/2015) 30 tablet 1  . glucosamine-chondroitin 500-400 MG tablet Take 2 tablets by mouth daily. Reported on 12/31/2015    . pantoprazole (PROTONIX) 40 MG tablet TAKE 1 TABLET EVERY DAY (Patient not taking: Reported on 12/31/2015) 90 tablet 3   No current facility-administered medications on file prior to visit.    Allergies  Allergen Reactions  . Fexofenadine Hcl Other (See Comments)    Doesn't agree with patient  .  Aricept [Donepezil Hcl]     nightmares  . Penicillins   . Tylenol [Acetaminophen]     "funny feeling in head."    Past Medical History  Diagnosis Date  . Breast cancer, left breast ([place]) 2003    s/p mastectomy, chemo.  Dr. Chancy Milroy oncologist  . Fracture of humerus, proximal, right, closed 07/2010    s/p hemiarthroplasty  . Osteoporosis   . Hypertension   . Anemia   . Arthritis     hands, knees  . Gastric ulcer with hemorrhage 02/14/2013  . Multiple duodenal ulcers 02/14/2013  . Helicobacter pylori (H. pylori) infection 02/15/2013    eradication confirmed 05/2013 [facility]    Past Surgical History  Procedure Laterality Date  . Shoulder hemi-arthroplasty Right 07/2010    Dr. Mardelle Matte  . Abdominal hysterectomy    . Knee arthroscopy Bilateral     [facility]lateral knees at [facility]  . Femur im nail  04/13/2012    Procedure: INTRAMEDULLARY (IM) NAIL FEMORAL;  Surgeon: Melina Schools, MD;  [facility];  Service: Orthopedics;  Laterality: Left;  . Esophagogastroduodenoscopy N/A 02/14/2013    Procedure: ESOPHAGOGASTRODUODENOSCOPY (EGD);  Surgeon: Gatha Mayer, MD;  Location: [facility];  Service: Endoscopy;  Laterality: N/A;  . Mastectomy Left   . Breath tek h  pylori N/A 06/06/2013    Procedure: BREATH TEK H PYLORI;  Surgeon: Gatha Mayer, MD;  Location: [facility];  Service: Endoscopy;  Laterality: N/A;  . Hip surgery      Family History  Problem Relation Age of Onset  . Myasthenia gravis Mother   . Coronary artery disease Father   . Colon cancer Sister     Social History   Social History  . Marital Status: Widowed    Spouse Name: N/A  . Number of Children: 6  . Years of Education: N/A   Occupational History  . retired    Social History Main Topics  . Smoking status: Never Smoker   . Smokeless tobacco: Never Used  . Alcohol Use: No  . Drug Use: No  . Sexual Activity: Not Currently   Other Topics Concern  . Not on file   Social History Narrative   Lives alone but daughter  lives behind her and very involved in her care.   Independent for all ADLs.   The PMH, PSH, Social History, Family History, Medications, and allergies have been reviewed in [facility] and have been updated if relevant.  Review of Systems  Respiratory: Negative.   Cardiovascular: Positive for leg swelling.  Neurological: Negative.        Objective:    BP 158/82 mmHg  Pulse 72  Temp(Src) 97.9 F (36.6 C) (Oral)  Wt 141 lb 8 oz (64.184 kg)  SpO2 94%   Physical Exam  Constitutional: She is oriented to person, place, and time. She appears well-developed and well-nourished. No distress.  HENT:  Head: Normocephalic and atraumatic.  Eyes: Conjunctivae are normal.  Cardiovascular: Normal rate, regular rhythm and normal heart sounds.   Pulmonary/Chest: Effort normal and breath sounds normal. No respiratory distress. She has no wheezes.  Musculoskeletal: She exhibits edema.  1+ pedal edema, left > right, no weeping or erythema. NTTP  Neurological: She is alert and oriented to person, place, and time. No cranial nerve deficit.  Skin: Skin is warm and dry. She is not diaphoretic.  Psychiatric: She has a normal mood and affect. Her behavior is normal. Judgment and thought content normal.          Assessment & Plan:   Pedal edema No Follow-up on file.

## 2015-12-31 NOTE — Assessment & Plan Note (Signed)
No red flag symptoms or signs on exam. Likely pedal edema. Discussed with her daughter and we agreed no further work up at this time. Advised keeping legs elevated and trial of compression hose. Call or return to clinic prn if these symptoms worsen or fail to improve as anticipated. The patient indicates understanding of these issues and agrees with the plan.

## 2015-12-31 NOTE — Progress Notes (Signed)
Pre visit review using our clinic review tool, if applicable. No additional management support is needed unless otherwise documented below in the visit note. 

## 2016-03-23 ENCOUNTER — Other Ambulatory Visit: Payer: Self-pay | Admitting: Family Medicine

## 2016-05-21 DIAGNOSIS — Z1231 Encounter for screening mammogram for malignant neoplasm of breast: Secondary | ICD-10-CM | POA: Diagnosis not present

## 2016-05-21 DIAGNOSIS — Z853 Personal history of malignant neoplasm of breast: Secondary | ICD-10-CM | POA: Diagnosis not present

## 2016-05-22 ENCOUNTER — Encounter: Payer: Self-pay | Admitting: Family Medicine

## 2016-06-15 ENCOUNTER — Encounter (HOSPITAL_COMMUNITY): Payer: Self-pay

## 2016-06-15 ENCOUNTER — Emergency Department (HOSPITAL_COMMUNITY): Payer: Medicare PPO

## 2016-06-15 ENCOUNTER — Emergency Department (HOSPITAL_COMMUNITY)
Admission: EM | Admit: 2016-06-15 | Discharge: 2016-06-15 | Disposition: A | Discharge status: Home / Self Care | Payer: Medicare PPO | Attending: Emergency Medicine | Admitting: Emergency Medicine

## 2016-06-15 DIAGNOSIS — Z96611 Presence of right artificial shoulder joint: Secondary | ICD-10-CM | POA: Insufficient documentation

## 2016-06-15 DIAGNOSIS — Z853 Personal history of malignant neoplasm of breast: Secondary | ICD-10-CM | POA: Insufficient documentation

## 2016-06-15 DIAGNOSIS — F0390 Unspecified dementia without behavioral disturbance: Secondary | ICD-10-CM | POA: Diagnosis not present

## 2016-06-15 DIAGNOSIS — R404 Transient alteration of awareness: Secondary | ICD-10-CM | POA: Diagnosis not present

## 2016-06-15 DIAGNOSIS — S0990XA Unspecified injury of head, initial encounter: Secondary | ICD-10-CM | POA: Diagnosis not present

## 2016-06-15 DIAGNOSIS — W1830XA Fall on same level, unspecified, initial encounter: Secondary | ICD-10-CM | POA: Diagnosis not present

## 2016-06-15 DIAGNOSIS — Y929 Unspecified place or not applicable: Secondary | ICD-10-CM | POA: Diagnosis not present

## 2016-06-15 DIAGNOSIS — Y939 Activity, unspecified: Secondary | ICD-10-CM | POA: Insufficient documentation

## 2016-06-15 DIAGNOSIS — W19XXXA Unspecified fall, initial encounter: Secondary | ICD-10-CM

## 2016-06-15 DIAGNOSIS — Y999 Unspecified external cause status: Secondary | ICD-10-CM | POA: Insufficient documentation

## 2016-06-15 DIAGNOSIS — I10 Essential (primary) hypertension: Secondary | ICD-10-CM | POA: Insufficient documentation

## 2016-06-15 DIAGNOSIS — M25511 Pain in right shoulder: Secondary | ICD-10-CM | POA: Diagnosis not present

## 2016-06-15 DIAGNOSIS — R42 Dizziness and giddiness: Secondary | ICD-10-CM | POA: Diagnosis not present

## 2016-06-15 DIAGNOSIS — S299XXA Unspecified injury of thorax, initial encounter: Secondary | ICD-10-CM | POA: Diagnosis not present

## 2016-06-15 LAB — BASIC METABOLIC PANEL
ANION GAP: 12 (ref 5–15)
BUN: 15 mg/dL (ref 6–20)
CHLORIDE: 107 mmol/L (ref 101–111)
CO2: 21 mmol/L — ABNORMAL LOW (ref 22–32)
Calcium: 9.3 mg/dL (ref 8.9–10.3)
Creatinine, Ser: 1.04 mg/dL — ABNORMAL HIGH (ref 0.44–1.00)
GFR, EST AFRICAN AMERICAN: 54 mL/min — AB (ref >60)
GFR, EST NON AFRICAN AMERICAN: 47 mL/min — AB (ref >60)
Glucose, Bld: 94 mg/dL (ref 65–99)
POTASSIUM: 4.2 mmol/L (ref 3.5–5.1)
SODIUM: 140 mmol/L (ref 135–145)

## 2016-06-15 LAB — CBC WITH DIFFERENTIAL/PLATELET
BASOS ABS: 0 10*3/uL (ref 0.0–0.1)
Basophils Relative: 0 %
EOS ABS: 0 10*3/uL (ref 0.0–0.7)
Eosinophils Relative: 0 %
HCT: 41.8 % (ref 36.0–46.0)
HEMOGLOBIN: 13 g/dL (ref 12.0–15.0)
LYMPHS ABS: 2.2 10*3/uL (ref 0.7–4.0)
Lymphocytes Relative: 28 %
MCH: 27.8 pg (ref 26.0–34.0)
MCHC: 31.1 g/dL (ref 30.0–36.0)
MCV: 89.3 fL (ref 78.0–100.0)
Monocytes Absolute: 1 10*3/uL (ref 0.1–1.0)
Monocytes Relative: 12 %
NEUTROS PCT: 60 %
Neutro Abs: 4.8 10*3/uL (ref 1.7–7.7)
PLATELETS: 212 10*3/uL (ref 150–400)
RBC: 4.68 MIL/uL (ref 3.87–5.11)
RDW: 15.3 % (ref 11.5–15.5)
WBC: 8 10*3/uL (ref 4.0–10.5)

## 2016-06-15 LAB — URINE MICROSCOPIC-ADD ON

## 2016-06-15 LAB — URINALYSIS, ROUTINE W REFLEX MICROSCOPIC
BILIRUBIN URINE: NEGATIVE
Glucose, UA: NEGATIVE mg/dL
Hgb urine dipstick: NEGATIVE
Ketones, ur: NEGATIVE mg/dL
NITRITE: NEGATIVE
PROTEIN: NEGATIVE mg/dL
SPECIFIC GRAVITY, URINE: 1.008 (ref 1.005–1.030)
pH: 6 (ref 5.0–8.0)

## 2016-06-15 NOTE — ED Triage Notes (Signed)
EMS called out for a unwitnessed fall. Patient was found in her yard after what she said as her getting dizzy and fell.  Patient has underlying dementia but no new neurologic deficits per patient family.  Not on blood thinner.

## 2016-06-15 NOTE — ED Provider Notes (Signed)
[facility] DEPT Provider Note   CSN: [id number] Arrival date & time: 06/15/16  1739     History   Chief Complaint Chief Complaint  Patient presents with  . Fall    HPI Katie Silva is a 82 y.o. female.  82 yo F with a chief complaint of a fall. Patient is demented and there is somewhat limited history. Per bystanders patient was ambulating from her house to her neighbor's when she collapsed. Per the patient she was mildly dizzy just prior to the fall. Fell onto her right side. Initially complaining of some right shoulder and right hip pain. The symptoms have resolved. Patient does not remember the incident. Denies head injury denies loss consciousness denies any areas of tenderness. Level V caveat dementia   The history is provided by the patient.  Fall  This is a new problem. The current episode started less than 1 hour ago. The problem occurs constantly. The problem has not changed since onset.Pertinent negatives include no chest pain, no headaches and no shortness of breath. Nothing aggravates the symptoms. Nothing relieves the symptoms. She has tried nothing for the symptoms. The treatment provided no relief.    Past Medical History:  Diagnosis Date  . Anemia   . Arthritis    hands, knees  . Breast cancer, left breast ([place]) 2003   s/p mastectomy, chemo.  Dr. Chancy Milroy oncologist  . Fracture of humerus, proximal, right, closed 07/2010   s/p hemiarthroplasty  . Gastric ulcer with hemorrhage 02/14/2013  . Helicobacter pylori (H. pylori) infection 02/15/2013   eradication confirmed 05/2013 [facility]  . Hypertension   . Multiple duodenal ulcers 02/14/2013  . Osteoporosis     Patient Active Problem List   Diagnosis Date Noted  . Pedal edema 12/31/2015  . Dementia 03/14/2015  . Fall at home 06/05/2014  . Postoperative anemia 04/14/2012  . SVT (supraventricular tachycardia) ([place]) 04/14/2012  . Closed left hip fracture ([place]) 04/13/2012  . HTN (hypertension) 01/30/2011  .  Breast cancer, left breast ([place])   . Osteoporosis   . Fracture of humerus, proximal, right, closed 07/08/2010    Past Surgical History:  Procedure Laterality Date  . ABDOMINAL HYSTERECTOMY    . BREATH TEK H PYLORI N/A 06/06/2013   Procedure: BREATH TEK H PYLORI;  Surgeon: Gatha Mayer, MD;  [facility];  Service: Endoscopy;  Laterality: N/A;  . ESOPHAGOGASTRODUODENOSCOPY N/A 02/14/2013   Procedure: ESOPHAGOGASTRODUODENOSCOPY (EGD);  Surgeon: Gatha Mayer, MD;  Location: [facility];  Service: Endoscopy;  Laterality: N/A;  . FEMUR IM NAIL  04/13/2012   Procedure: INTRAMEDULLARY (IM) NAIL FEMORAL;  Surgeon: Melina Schools, MD;  [facility];  Service: Orthopedics;  Laterality: Left;  . HIP SURGERY    . KNEE ARTHROSCOPY Bilateral    BIlateral knees at [facility]  . MASTECTOMY Left   . SHOULDER HEMI-ARTHROPLASTY Right 07/2010   Dr. Mardelle Matte    OB History    No data available       Home Medications    Prior to Admission medications   Medication Sig Start Date End Date Taking? Authorizing Provider  lisinopril (PRINIVIL,ZESTRIL) 10 MG tablet Take 1 tablet (10 mg total) by mouth daily. WELLNESS EXAM REQUIRED FOR ADDITIONAL REFILLS 03/24/16  Yes Lucille Passy, MD  Multiple Vitamin (MULTIVITAMIN WITH MINERALS) TABS tablet Take 1 tablet by mouth daily.   Yes Historical Provider, MD  trolamine salicylate (ASPERCREME) 10 % cream Apply 1 application topically daily as needed (knee pain).   Yes Historical  Provider, MD  diclofenac sodium (VOLTAREN) 1 % GEL Apply 2 g topically 4 (four) times daily. Patient not taking: Reported on 06/15/2016 11/28/13   Robyn Haber, MD  donepezil (ARICEPT) 5 MG tablet Take 1 tablet (5 mg total) by mouth at bedtime. Patient not taking: Reported on 06/15/2016 03/14/15   Lucille Passy, MD  pantoprazole (PROTONIX) 40 MG tablet TAKE 1 TABLET EVERY DAY Patient not taking: Reported on 06/15/2016 07/01/15   Lucille Passy, MD    Family History Family History    Problem Relation Age of Onset  . Myasthenia gravis Mother   . Coronary artery disease Father   . Colon cancer Sister     Social History Social History  Substance Use Topics  . Smoking status: Never Smoker  . Smokeless tobacco: Never Used  . Alcohol use No     Allergies   Fexofenadine hcl; Aricept [donepezil hcl]; Penicillins; and Tylenol [acetaminophen]   Review of Systems Review of Systems  Constitutional: Negative for chills and fever.  HENT: Negative for congestion and rhinorrhea.   Eyes: Negative for redness and visual disturbance.  Respiratory: Negative for shortness of breath and wheezing.   Cardiovascular: Negative for chest pain and palpitations.  Gastrointestinal: Negative for nausea and vomiting.  Genitourinary: Negative for dysuria and urgency.  Musculoskeletal: Negative for arthralgias and myalgias.  Skin: Negative for pallor and wound.  Neurological: Positive for dizziness (vertigo like). Negative for headaches.     Physical Exam Updated Vital Signs BP (!) 210/90 (BP Location: Right Arm)   Pulse 74   Temp 98.1 F (36.7 C)   Resp 20   SpO2 100%   Physical Exam  Constitutional: She is oriented to person, place, and time. She appears well-developed and well-nourished. No distress.  HENT:  Head: Normocephalic and atraumatic.  Eyes: EOM are normal. Pupils are equal, round, and reactive to light.  Neck: Normal range of motion. Neck supple.  Cardiovascular: Normal rate and regular rhythm.  Exam reveals no gallop and no friction rub.   No murmur heard. Pulmonary/Chest: Effort normal. She has no wheezes. She has no rales.  Abdominal: Soft. She exhibits no distension. There is no tenderness.  Musculoskeletal: She exhibits no edema or tenderness.  Palpated from head to toe with no noted bony tenderness. Patient does have an old healing area to her left elbow. Full range of motion and no crepitus.  Neurological: She is alert and oriented to person, place, and  time.  Skin: Skin is warm and dry. She is not diaphoretic.  Psychiatric: She has a normal mood and affect. Her behavior is normal.  Nursing note and vitals reviewed.    ED Treatments / Results  Labs (all labs ordered are listed, but only abnormal results are displayed) Labs Reviewed  URINALYSIS, ROUTINE W REFLEX MICROSCOPIC (NOT AT [facility]) - Abnormal; Notable for the following:       Result Value   Leukocytes, UA TRACE (*)    All other components within normal limits  BASIC METABOLIC PANEL - Abnormal; Notable for the following:    CO2 21 (*)    Creatinine, Ser 1.04 (*)    GFR calc non Af Amer 47 (*)    GFR calc Af Amer 54 (*)    All other components within normal limits  URINE MICROSCOPIC-ADD ON - Abnormal; Notable for the following:    Squamous Epithelial / LPF 0-5 (*)    Bacteria, UA RARE (*)    All other components within normal limits  CBC WITH DIFFERENTIAL/PLATELET    EKG  EKG Interpretation  Date/Time:  Monday June 15 2016 17:43:39 EDT Ventricular Rate:  75 PR Interval:    QRS Duration: 101 QT Interval:  423 QTC Calculation: 473 R Axis:   6 Text Interpretation:  Sinus rhythm Left ventricular hypertrophy No significant change since last tracing Confirmed by Roan Sawchuk MD, DANIEL [phone]) on 06/15/2016 5:51:52 PM       Radiology Dg Chest 2 View  Result Date: 06/15/2016 CLINICAL DATA:  Acute onset of dizziness and fall. Concern for chest injury. Initial encounter. EXAM: CHEST  2 VIEW COMPARISON:  Chest radiograph performed 07/26/2010 FINDINGS: The lungs are well-aerated and clear. There is no evidence of focal opacification, pleural effusion or pneumothorax. The heart is borderline normal in size. No acute osseous abnormalities are seen. The patient's right shoulder arthroplasty is grossly unremarkable, though incompletely characterized. Diffuse calcification is noted along the visualized abdominal aorta. Mild degenerative change is noted along the lumbar spine. IMPRESSION:  1. No acute cardiopulmonary process seen. 2. Diffuse aortic atherosclerosis noted. Electronically Signed   By: Garald Balding M.D.   On: 06/15/2016 19:09   Ct Head Wo Contrast  Result Date: 06/15/2016 CLINICAL DATA:  Status post stumbled and fall today. Initial encounter. EXAM: CT HEAD WITHOUT CONTRAST TECHNIQUE: Contiguous axial images were obtained from the base of the skull through the vertex without intravenous contrast. COMPARISON:  Head CT scan 04/13/2012. FINDINGS: Brain: Cortical atrophy and chronic microvascular ischemic change are seen. No evidence of acute intracranial abnormality including hemorrhage, infarct, mass lesion, mass effect, midline shift or abnormal extra-axial fluid collection is identified. Vascular: Atherosclerosis is noted. Skull: Intact. Sinuses/Orbits: Status post bilateral lens extraction. Otherwise unremarkable. Other: None. IMPRESSION: No acute abnormality. Atrophy and chronic microvascular ischemic change. Electronically Signed   By: Inge Rise M.D.   On: 06/15/2016 18:30    Procedures Procedures (including critical care time)  Medications Ordered in ED Medications - No data to display   Initial Impression / Assessment and Plan / ED Course  I have reviewed the triage vital signs and the nursing notes.  Pertinent labs & imaging results that were available during my care of the patient were reviewed by me and considered in my medical decision making (see chart for details).  Clinical Course    82 yo F With a chief complaint of a fall. Sounds like it may have been syncopal. Patient is hypertensive, no focal neurologic deficits will obtain a CT of the head. Chest x-ray. Basic labs and urine.  Workup here is unremarkable. Patient continues to state she has no current complaints. Discussed with family, discharge home.  11:12 PM:  I have discussed the diagnosis/risks/treatment options with the patient and family and believe the pt to be eligible for  discharge home to follow-up with PCP. We also discussed returning to the ED immediately if new or worsening sx occur. We discussed the sx which are most concerning (e.g., sudden worsening pain, fever, inability to tolerate by mouth) that necessitate immediate return. Medications administered to the patient during their visit and any new prescriptions provided to the patient are listed below.  Medications given during this visit Medications - No data to display   The patient appears reasonably screen and/or stabilized for discharge and I doubt any other medical condition or other [facility] requiring further screening, evaluation, or treatment in the ED at this time prior to discharge.    Final Clinical Impressions(s) / ED Diagnoses   Final diagnoses:  Fall, initial encounter    New Prescriptions Discharge Medication List as of 06/15/2016  8:03 PM       Deno Etienne, DO 06/15/16 2312

## 2016-06-25 DIAGNOSIS — M1711 Unilateral primary osteoarthritis, right knee: Secondary | ICD-10-CM | POA: Diagnosis not present

## 2016-06-25 DIAGNOSIS — M25561 Pain in right knee: Secondary | ICD-10-CM | POA: Diagnosis not present

## 2016-06-25 DIAGNOSIS — M2241 Chondromalacia patellae, right knee: Secondary | ICD-10-CM | POA: Diagnosis not present

## 2016-06-25 DIAGNOSIS — M25661 Stiffness of right knee, not elsewhere classified: Secondary | ICD-10-CM | POA: Diagnosis not present

## 2016-07-29 ENCOUNTER — Other Ambulatory Visit: Payer: Self-pay | Admitting: Family Medicine

## 2016-07-29 DIAGNOSIS — H43813 Vitreous degeneration, bilateral: Secondary | ICD-10-CM | POA: Diagnosis not present

## 2016-07-29 DIAGNOSIS — H40013 Open angle with borderline findings, low risk, bilateral: Secondary | ICD-10-CM | POA: Diagnosis not present

## 2016-07-29 DIAGNOSIS — H40011 Open angle with borderline findings, low risk, right eye: Secondary | ICD-10-CM | POA: Diagnosis not present

## 2016-07-29 DIAGNOSIS — Z961 Presence of intraocular lens: Secondary | ICD-10-CM | POA: Diagnosis not present

## 2016-07-29 DIAGNOSIS — H35033 Hypertensive retinopathy, bilateral: Secondary | ICD-10-CM | POA: Diagnosis not present

## 2016-08-10 ENCOUNTER — Other Ambulatory Visit: Payer: Self-pay | Admitting: Family Medicine

## 2016-08-13 DIAGNOSIS — M25561 Pain in right knee: Secondary | ICD-10-CM | POA: Diagnosis not present

## 2016-08-13 DIAGNOSIS — M25661 Stiffness of right knee, not elsewhere classified: Secondary | ICD-10-CM | POA: Diagnosis not present

## 2016-08-13 DIAGNOSIS — M1711 Unilateral primary osteoarthritis, right knee: Secondary | ICD-10-CM | POA: Diagnosis not present

## 2016-08-13 DIAGNOSIS — M2241 Chondromalacia patellae, right knee: Secondary | ICD-10-CM | POA: Diagnosis not present

## 2016-08-20 DIAGNOSIS — M1711 Unilateral primary osteoarthritis, right knee: Secondary | ICD-10-CM | POA: Diagnosis not present

## 2016-08-27 DIAGNOSIS — M1711 Unilateral primary osteoarthritis, right knee: Secondary | ICD-10-CM | POA: Diagnosis not present

## 2016-11-05 ENCOUNTER — Telehealth: Payer: Self-pay | Admitting: Family Medicine

## 2016-11-05 NOTE — Telephone Encounter (Signed)
Pt needs cpe with PCP. Does not need AWV with Lesia. Pt daughter will call back

## 2016-11-05 NOTE — Telephone Encounter (Signed)
Patient's daughter called and scheduled physical with Dr.Aron.

## 2016-12-01 ENCOUNTER — Encounter: Payer: Medicare PPO | Admitting: Family Medicine

## 2016-12-02 ENCOUNTER — Encounter: Payer: Medicare PPO | Admitting: Family Medicine

## 2016-12-03 ENCOUNTER — Encounter: Payer: Self-pay | Admitting: Family Medicine

## 2016-12-03 ENCOUNTER — Ambulatory Visit (INDEPENDENT_AMBULATORY_CARE_PROVIDER_SITE_OTHER): Payer: Medicare PPO | Admitting: Family Medicine

## 2016-12-03 VITALS — BP 140/86 | HR 89 | Temp 97.6°F | Ht 63.0 in | Wt 136.0 lb

## 2016-12-03 DIAGNOSIS — Z0000 Encounter for general adult medical examination without abnormal findings: Secondary | ICD-10-CM

## 2016-12-03 DIAGNOSIS — I10 Essential (primary) hypertension: Secondary | ICD-10-CM

## 2016-12-03 DIAGNOSIS — F0390 Unspecified dementia without behavioral disturbance: Secondary | ICD-10-CM | POA: Diagnosis not present

## 2016-12-03 LAB — COMPREHENSIVE METABOLIC PANEL
ALT: 11 U/L (ref 0–35)
AST: 17 U/L (ref 0–37)
Albumin: 4.3 g/dL (ref 3.5–5.2)
Alkaline Phosphatase: 35 U/L — ABNORMAL LOW (ref 39–117)
BUN: 18 mg/dL (ref 6–23)
CO2: 29 meq/L (ref 19–32)
Calcium: 9.4 mg/dL (ref 8.4–10.5)
Chloride: 108 mEq/L (ref 96–112)
Creatinine, Ser: 1.01 mg/dL (ref 0.40–1.20)
GFR: 54.86 mL/min — AB (ref >60.00)
GLUCOSE: 92 mg/dL (ref 70–99)
POTASSIUM: 4.5 meq/L (ref 3.5–5.1)
Sodium: 143 mEq/L (ref 135–145)
Total Bilirubin: 0.7 mg/dL (ref 0.2–1.2)
Total Protein: 6.5 g/dL (ref 6.0–8.3)

## 2016-12-03 LAB — CBC WITH DIFFERENTIAL/PLATELET
BASOS ABS: 0 10*3/uL (ref 0.0–0.1)
BASOS PCT: 0.3 % (ref 0.0–3.0)
Eosinophils Absolute: 0 10*3/uL (ref 0.0–0.7)
Eosinophils Relative: 0.2 % (ref 0.0–5.0)
HCT: 40.6 % (ref 36.0–46.0)
Hemoglobin: 13.1 g/dL (ref 12.0–15.0)
LYMPHS ABS: 1.9 10*3/uL (ref 0.7–4.0)
Lymphocytes Relative: 26.1 % (ref 12.0–46.0)
MCHC: 32.4 g/dL (ref 30.0–36.0)
MCV: 86.1 fl (ref 78.0–100.0)
MONOS PCT: 12.4 % — AB (ref 3.0–12.0)
Monocytes Absolute: 0.9 10*3/uL (ref 0.1–1.0)
NEUTROS ABS: 4.5 10*3/uL (ref 1.4–7.7)
NEUTROS PCT: 61 % (ref 43.0–77.0)
PLATELETS: 223 10*3/uL (ref 150.0–400.0)
RBC: 4.71 Mil/uL (ref 3.87–5.11)
RDW: 15.3 % (ref 11.5–15.5)
WBC: 7.4 10*3/uL (ref 4.0–10.5)

## 2016-12-03 LAB — LIPID PANEL
Cholesterol: 156 mg/dL (ref 0–200)
HDL: 55.5 mg/dL (ref >39.00)
LDL CALC: 88 mg/dL (ref 0–99)
NonHDL: 100.32
Total CHOL/HDL Ratio: 3
Triglycerides: 61 mg/dL (ref 0.0–149.0)
VLDL: 12.2 mg/dL (ref 0.0–40.0)

## 2016-12-03 LAB — TSH: TSH: 1.98 u[IU]/mL (ref 0.35–4.50)

## 2016-12-03 NOTE — Patient Instructions (Signed)
Great to see you. Happy Birthday! 

## 2016-12-03 NOTE — Assessment & Plan Note (Signed)
Mild to moderate. She was intolerant to aricept and not interested in other rx. Discussed with daughter about getting private help in the home since she is living alone.

## 2016-12-03 NOTE — Assessment & Plan Note (Signed)
The patients weight, height, BMI and visual acuity have been recorded in the chart.  Cognitive function assessed.   I have made referrals, counseling and provided education to the patient based review of the above and I have provided the pt with a written personalized care plan for preventive services.  Orders Placed This Encounter  Procedures  . CBC with Differential/Platelet  . Comprehensive metabolic panel  . Lipid panel  . TSH

## 2016-12-03 NOTE — Progress Notes (Signed)
Subjective:   Patient ID: Katie Silva, female    DOB: 07/23/1926, 83 y.o.   MRN: [id number]  Katie Silva is a pleasant 83 y.o. year old female who presents to clinic today with her daughter for Medicare Wellness  on 12/03/2016  HPI: I have personally reviewed the Medicare Annual Wellness questionnaire and have noted 1. The patient's medical and social history 2. Their use of alcohol, tobacco or illicit drugs 3. Their current medications and supplements 4. The patient's functional ability including ADL's, fall risks, home safety risks and hearing or visual             impairment. 5. Diet and physical activities 6. Evidence for depression or mood disorders  End of life wishes discussed and updated in Social History.  The roster of all physicians providing medical care to patient - is listed in the CareTeams section of the chart.   She is currently not taking any medications other than a MVI and topical aspercreme.  Overall doing well.  Lives home a lone.  Daughter lives 10 minutes away and is very involved with her care. Lab Results  Component Value Date   CHOL 187 01/30/2011   HDL 59 01/30/2011   LDLCALC 99 01/30/2011   TRIG 143 01/30/2011   CHOLHDL 3.2 01/30/2011   Lab Results  Component Value Date   ALT 12 02/13/2013   AST 19 02/13/2013   ALKPHOS 51 02/13/2013   BILITOT 0.2 (L) 02/13/2013   Lab Results  Component Value Date   NA 140 06/15/2016   K 4.2 06/15/2016   CL 107 06/15/2016   CO2 21 (L) 06/15/2016     Current Outpatient Prescriptions on File Prior to Visit  Medication Sig Dispense Refill  . Multiple Vitamin (MULTIVITAMIN WITH MINERALS) TABS tablet Take 1 tablet by mouth daily.    [name] trolamine salicylate (ASPERCREME) 10 % cream Apply 1 application topically daily as needed (knee pain).     No current facility-administered medications on file prior to visit.     Allergies  Allergen Reactions  . Fexofenadine Hcl Other (See Comments)   Doesn't agree with patient  . Aricept [Donepezil Hcl] Other (See Comments)    nightmares  . Penicillins Other (See Comments)    Pt does not remember reaction to penicillin ("did not work")  . Tylenol [Acetaminophen] Other (See Comments)    "funny feeling in head."    Past Medical History:  Diagnosis Date  . Anemia   . Arthritis    hands, knees  . Breast cancer, left breast ([place]) 2003   s/p mastectomy, chemo.  Dr. Chancy Milroy oncologist  . Fracture of humerus, proximal, right, closed 07/2010   s/p hemiarthroplasty  . Gastric ulcer with hemorrhage 02/14/2013  . Helicobacter pylori (H. pylori) infection 02/15/2013   eradication confirmed 05/2013 [facility]  . Hypertension   . Multiple duodenal ulcers 02/14/2013  . Osteoporosis     Past Surgical History:  Procedure Laterality Date  . ABDOMINAL HYSTERECTOMY    . BREATH TEK H PYLORI N/A 06/06/2013   Procedure: BREATH TEK H PYLORI;  Surgeon: Gatha Mayer, MD;  [facility];  Service: Endoscopy;  Laterality: N/A;  . ESOPHAGOGASTRODUODENOSCOPY N/A 02/14/2013   Procedure: ESOPHAGOGASTRODUODENOSCOPY (EGD);  Surgeon: Gatha Mayer, MD;  Location: [facility];  Service: Endoscopy;  Laterality: N/A;  . FEMUR IM NAIL  04/13/2012   Procedure: INTRAMEDULLARY (IM) NAIL FEMORAL;  Surgeon: Melina Schools, MD;  Location[facility];  Service: Orthopedics;  Laterality:  Left;  . HIP SURGERY    . KNEE ARTHROSCOPY Bilateral    [facility]lateral [facility] at [facility]  . MASTECTOMY Left   . SHOULDER HEMI-ARTHROPLASTY Right 07/2010   Dr. Mardelle Matte    Family History  Problem Relation Age of Onset  . Myasthenia gravis Mother   . Coronary artery disease Father   . Colon cancer Sister     Social History   Social History  . Marital status: Widowed    Spouse name: N/A  . Number of children: 6  . Years of education: N/A   Occupational History  . retired Retired   Social History Main Topics  . Smoking status: Never Smoker  . Smokeless tobacco: Never Used  . Alcohol use  No  . Drug use: No  . Sexual activity: Not Currently   Other Topics Concern  . Not on file   Social History Narrative   Lives alone but daughter lives behind her and very involved in her care.   Independent for all ADLs.   The PMH, PSH, Social History, Family History, Medications, and allergies have been reviewed in [facility], and have been updated if relevant.   Review of Systems  Constitutional: Negative.   HENT: Negative.   Eyes: Negative.   Respiratory: Negative.   Cardiovascular: Negative.   Gastrointestinal: Negative.   Endocrine: Negative.   Genitourinary: Negative.   Musculoskeletal: Negative.   Allergic/Immunologic: Negative.   Neurological: Negative.   Hematological: Negative.   Psychiatric/Behavioral: Negative.   All other systems reviewed and are negative.      Objective:    BP 140/86   Pulse 89   Temp 97.6 F (36.4 C)   Ht 5\' 3"  (1.6 m)   Wt 136 lb (61.7 kg)   SpO2 97%   BMI 24.09 kg/m    Physical Exam  Constitutional: She is oriented to person, place, and time. She appears well-developed and well-nourished. No distress.  HENT:  Head: Normocephalic and atraumatic.  Eyes: Conjunctivae are normal.  Cardiovascular: Normal rate and regular rhythm.   Pulmonary/Chest: Effort normal and breath sounds normal.  Abdominal: Soft.  Musculoskeletal: Normal range of motion. She exhibits no edema.  Neurological: She is alert and oriented to person, place, and time. No cranial nerve deficit.  Skin: Skin is warm and dry. She is not diaphoretic.  Psychiatric: She has a normal mood and affect. Her behavior is normal. Judgment and thought content normal.  Nursing note and vitals reviewed.         Assessment & Plan:   Medicare annual wellness visit, subsequent  Essential hypertension  Dementia without behavioral disturbance, unspecified dementia type No Follow-up on file.

## 2017-01-27 DIAGNOSIS — H02839 Dermatochalasis of unspecified eye, unspecified eyelid: Secondary | ICD-10-CM | POA: Diagnosis not present

## 2017-01-27 DIAGNOSIS — H01003 Unspecified blepharitis right eye, unspecified eyelid: Secondary | ICD-10-CM | POA: Diagnosis not present

## 2017-01-27 DIAGNOSIS — H1851 Endothelial corneal dystrophy: Secondary | ICD-10-CM | POA: Diagnosis not present

## 2017-01-27 DIAGNOSIS — H40013 Open angle with borderline findings, low risk, bilateral: Secondary | ICD-10-CM | POA: Diagnosis not present

## 2017-04-20 DIAGNOSIS — I10 Essential (primary) hypertension: Secondary | ICD-10-CM | POA: Diagnosis not present

## 2017-04-20 DIAGNOSIS — R413 Other amnesia: Secondary | ICD-10-CM | POA: Diagnosis not present

## 2017-04-20 DIAGNOSIS — M1711 Unilateral primary osteoarthritis, right knee: Secondary | ICD-10-CM | POA: Diagnosis not present

## 2017-04-20 DIAGNOSIS — Z6821 Body mass index (BMI) 21.0-21.9, adult: Secondary | ICD-10-CM | POA: Diagnosis not present

## 2017-05-27 ENCOUNTER — Ambulatory Visit: Payer: Medicare PPO | Admitting: Family Medicine

## 2017-05-31 ENCOUNTER — Encounter: Payer: Self-pay | Admitting: Family Medicine

## 2017-05-31 ENCOUNTER — Ambulatory Visit (INDEPENDENT_AMBULATORY_CARE_PROVIDER_SITE_OTHER): Payer: Medicare PPO | Admitting: Family Medicine

## 2017-05-31 VITALS — BP 146/90 | HR 60 | Temp 97.9°F | Wt 133.5 lb

## 2017-05-31 DIAGNOSIS — I10 Essential (primary) hypertension: Secondary | ICD-10-CM | POA: Diagnosis not present

## 2017-05-31 DIAGNOSIS — Z0289 Encounter for other administrative examinations: Secondary | ICD-10-CM

## 2017-05-31 DIAGNOSIS — Z111 Encounter for screening for respiratory tuberculosis: Secondary | ICD-10-CM | POA: Diagnosis not present

## 2017-05-31 MED ORDER — LISINOPRIL 10 MG PO TABS: 10.0000 mg | ORAL_TABLET | Freq: Every day | ORAL | 3 refills | Status: DC

## 2017-05-31 NOTE — Progress Notes (Signed)
Subjective:   Patient ID: Katie Silva, female    DOB: 15-Oct-1925, 83 y.o.   MRN: [id number]  Katie Silva is a pleasant 83 y.o. year old female who presents to clinic today with forms/ TB skin test (assisted Living)  on 05/31/2017  HPI:  She is currently not taking any medications other than, lisinopril a MVI and topical aspercreme.  Overall doing well.  Lives home a lone.  Daughter lives 10 minutes away but feels she now needs more monitored living arrangements in ALF.  Brings in [facility] form today.  Current Outpatient Prescriptions on File Prior to Visit  Medication Sig Dispense Refill  . Multiple Vitamin (MULTIVITAMIN WITH MINERALS) TABS tablet Take 1 tablet by mouth daily.    [name] trolamine salicylate (ASPERCREME) 10 % cream Apply 1 application topically daily as needed (knee pain).     No current facility-administered medications on file prior to visit.     Allergies  Allergen Reactions  . Fexofenadine Hcl Other (See Comments)    Doesn't agree with patient  . Aricept [Donepezil Hcl] Other (See Comments)    nightmares  . Penicillins Other (See Comments)    Pt does not remember reaction to penicillin ("did not work")  . Tylenol [Acetaminophen] Other (See Comments)    "funny feeling in head."    Past Medical History:  Diagnosis Date  . Anemia   . Arthritis    hands, knees  . Breast cancer, left breast ([place]) 2003   s/p mastectomy, chemo.  Dr. Chancy Milroy oncologist  . Fracture of humerus, proximal, right, closed 07/2010   s/p hemiarthroplasty  . Gastric ulcer with hemorrhage 02/14/2013  . Helicobacter pylori (H. pylori) infection 02/15/2013   eradication confirmed 05/2013 UBT  . Hypertension   . Multiple duodenal ulcers 02/14/2013  . Osteoporosis     Past Surgical History:  Procedure Laterality Date  . ABDOMINAL HYSTERECTOMY    . BREATH TEK H PYLORI N/A 06/06/2013   Procedure: BREATH TEK H PYLORI;  Surgeon: Gatha Mayer, MD;  [facility];  Service:  Endoscopy;  Laterality: N/A;  . ESOPHAGOGASTRODUODENOSCOPY N/A 02/14/2013   Procedure: ESOPHAGOGASTRODUODENOSCOPY (EGD);  Surgeon: Gatha Mayer, MD;  Location: [facility];  Service: Endoscopy;  Laterality: N/A;  . FEMUR IM NAIL  04/13/2012   Procedure: INTRAMEDULLARY (IM) NAIL FEMORAL;  Surgeon: Melina Schools, MD;  [facility]: [facility];  Service: Orthopedics;  Laterality: Left;  . HIP SURGERY    . KNEE ARTHROSCOPY Bilateral    BIlateral knees at [facility]  . MASTECTOMY Left   . SHOULDER HEMI-ARTHROPLASTY Right 07/2010   Dr. Mardelle Matte    Family History  Problem Relation Age of Onset  . Myasthenia gravis Mother   . Coronary artery disease Father   . Colon cancer Sister     Social History   Social History  . Marital status: Widowed    Spouse name: N/A  . Number of children: 6  . Years of education: N/A   Occupational History  . retired Retired   Social History Main Topics  . Smoking status: Never Smoker  . Smokeless tobacco: Never Used  . Alcohol use No  . Drug use: No  . Sexual activity: Not Currently   Other Topics Concern  . Not on file   Social History Narrative   Lives alone but daughter lives behind her and very involved in her care.   Independent for all ADLs.   The PMH, PSH, Social History, Family History, Medications, and allergies  have been reviewed in [facility], and have been updated if relevant.   Review of Systems  Psychiatric/Behavioral: Positive for confusion. Negative for agitation, behavioral problems, decreased concentration, dysphoric mood, hallucinations, self-injury and sleep disturbance. The patient is not nervous/anxious and is not hyperactive.   All other systems reviewed and are negative.      Objective:    BP (!) 146/90 (BP Location: Right Arm, Patient Position: Sitting, Cuff Size: Normal)   Pulse 60   Temp 97.9 F (36.6 C) (Oral)   Wt 133 lb 8 oz (60.6 kg)   SpO2 98%   BMI 23.65 kg/m    Physical Exam  Constitutional: She is oriented to person,  place, and time. She appears well-developed and well-nourished.  HENT:  Head: Normocephalic and atraumatic.  Eyes: Conjunctivae are normal.  Cardiovascular: Normal rate.   Pulmonary/Chest: Effort normal.  Musculoskeletal: Normal range of motion.  Neurological: She is alert and oriented to person, place, and time. No cranial nerve deficit.  Skin: Skin is warm and dry.  Psychiatric: She has a normal mood and affect. Her behavior is normal. Thought content normal.  Nursing note and vitals reviewed.         Assessment & Plan:   Essential hypertension  Encounter for completion of form with patient No Follow-up on file.

## 2017-05-31 NOTE — Assessment & Plan Note (Signed)
FL2 filled out and returned to pt's daughter. TB test given.

## 2017-06-03 LAB — TB SKIN TEST
Induration: 0 mm
TB Skin Test: NEGATIVE

## 2017-06-23 ENCOUNTER — Telehealth: Payer: Self-pay

## 2017-06-23 NOTE — Telephone Encounter (Signed)
Mickel Baas with [facility] left v/m requesting verbal orders for [facility] nursing to assist pt to transition to assisted living and address skin tear acquired during transfer.

## 2017-06-23 NOTE — Telephone Encounter (Signed)
[facility] left v/m requesting cb. I called Katie Silva back and Katie Silva saw pt at assisted living and request orders for eval for [facility] PT and [facility] nursing.

## 2017-06-24 NOTE — Telephone Encounter (Signed)
[facility] representative came by office with [facility] referral form; signed form as verbal order from Dr Deborra Medina. Copy of form sent for scanning.

## 2017-06-24 NOTE — Telephone Encounter (Signed)
Okay to give orders as requested;  

## 2017-06-25 ENCOUNTER — Telehealth: Payer: Self-pay | Admitting: Family Medicine

## 2017-06-25 NOTE — Telephone Encounter (Signed)
PT daughter dropped off [facility] form for aid to be filled out. I placed in Rx tower.

## 2017-06-28 ENCOUNTER — Telehealth: Payer: Self-pay

## 2017-06-28 NOTE — Telephone Encounter (Signed)
We could certainly try trazodone at bedtime.  Which pharmacy?

## 2017-06-28 NOTE — Telephone Encounter (Signed)
Copied from [place] #[id number]. Topic: Quick Communication - See Telephone Encounter >> Jun 28, 2017  8:45 AM Malena Catholic I, NT wrote: CRM for notification. See Telephone encounter for:RX REFILLS  06/28/17.   PER DAUGHTER PT NEW TO NURSING HOME LOOKING FOR SOMETHING FOR SLEEP

## 2017-06-28 NOTE — Telephone Encounter (Signed)
Copied from [place] #[id number]. Topic: Quick Communication - See Telephone Encounter >> Jun 28, 2017  8:45 AM Malena Catholic I, NT wrote: CRM for notification. See Telephone encounter for:  06/28/17.

## 2017-06-29 DIAGNOSIS — M21061 Valgus deformity, not elsewhere classified, right knee: Secondary | ICD-10-CM | POA: Diagnosis not present

## 2017-06-29 DIAGNOSIS — Z9981 Dependence on supplemental oxygen: Secondary | ICD-10-CM | POA: Diagnosis not present

## 2017-06-29 DIAGNOSIS — F0391 Unspecified dementia with behavioral disturbance: Secondary | ICD-10-CM | POA: Diagnosis not present

## 2017-06-29 DIAGNOSIS — M150 Primary generalized (osteo)arthritis: Secondary | ICD-10-CM | POA: Diagnosis not present

## 2017-06-29 DIAGNOSIS — I10 Essential (primary) hypertension: Secondary | ICD-10-CM | POA: Diagnosis not present

## 2017-06-29 DIAGNOSIS — M810 Age-related osteoporosis without current pathological fracture: Secondary | ICD-10-CM | POA: Diagnosis not present

## 2017-06-29 DIAGNOSIS — S51852D Open bite of left forearm, subsequent encounter: Secondary | ICD-10-CM | POA: Diagnosis not present

## 2017-06-29 DIAGNOSIS — D649 Anemia, unspecified: Secondary | ICD-10-CM | POA: Diagnosis not present

## 2017-06-30 ENCOUNTER — Telehealth: Payer: Self-pay

## 2017-06-30 DIAGNOSIS — M150 Primary generalized (osteo)arthritis: Secondary | ICD-10-CM | POA: Diagnosis not present

## 2017-06-30 DIAGNOSIS — F0391 Unspecified dementia with behavioral disturbance: Secondary | ICD-10-CM | POA: Diagnosis not present

## 2017-06-30 DIAGNOSIS — D649 Anemia, unspecified: Secondary | ICD-10-CM | POA: Diagnosis not present

## 2017-06-30 DIAGNOSIS — Z9981 Dependence on supplemental oxygen: Secondary | ICD-10-CM | POA: Diagnosis not present

## 2017-06-30 DIAGNOSIS — I10 Essential (primary) hypertension: Secondary | ICD-10-CM | POA: Diagnosis not present

## 2017-06-30 DIAGNOSIS — M810 Age-related osteoporosis without current pathological fracture: Secondary | ICD-10-CM | POA: Diagnosis not present

## 2017-06-30 DIAGNOSIS — S51852D Open bite of left forearm, subsequent encounter: Secondary | ICD-10-CM | POA: Diagnosis not present

## 2017-06-30 DIAGNOSIS — M21061 Valgus deformity, not elsewhere classified, right knee: Secondary | ICD-10-CM | POA: Diagnosis not present

## 2017-06-30 MED ORDER — TRAZODONE HCL 50 MG PO TABS: 25.0000 mg | ORAL_TABLET | Freq: Every evening | ORAL | 3 refills | Status: DC | PRN

## 2017-06-30 NOTE — Telephone Encounter (Signed)
eRx sent

## 2017-06-30 NOTE — Telephone Encounter (Signed)
Copied from [place] [id number]. Topic: Inquiry >> Jun 29, 2017 12:45 PM Corie Chiquito, [place] wrote: Reason for CRM: Patient daughter would like if someone could give her a call. Has questions about mothers medications  This is a question for Triage/thx dmf

## 2017-06-30 NOTE — Telephone Encounter (Signed)
Daughter called back - pt uses [facility] on [address].   cb number is [phone] for daughter.  Please call when rx is filled.  Pt is now at home with daughter.

## 2017-07-01 ENCOUNTER — Telehealth: Payer: Self-pay | Admitting: Family Medicine

## 2017-07-01 ENCOUNTER — Ambulatory Visit (INDEPENDENT_AMBULATORY_CARE_PROVIDER_SITE_OTHER): Payer: Medicare PPO | Admitting: Family Medicine

## 2017-07-01 ENCOUNTER — Encounter: Payer: Self-pay | Admitting: Family Medicine

## 2017-07-01 VITALS — BP 174/79 | HR 65 | Temp 98.7°F | Resp 16 | Ht 63.0 in | Wt 131.4 lb

## 2017-07-01 DIAGNOSIS — F0390 Unspecified dementia without behavioral disturbance: Secondary | ICD-10-CM

## 2017-07-01 DIAGNOSIS — R58 Hemorrhage, not elsewhere classified: Secondary | ICD-10-CM | POA: Diagnosis not present

## 2017-07-01 MED ORDER — SILVER SULFADIAZINE 1 % EX CREA: 1.0000 "application " | TOPICAL_CREAM | Freq: Every day | CUTANEOUS | 0 refills | Status: DC

## 2017-07-01 NOTE — Patient Instructions (Addendum)
IF you received an x-ray today, you will receive an invoice from [facility]. Please contact [facility] at [phone] with questions or concerns regarding your invoice.   IF you received labwork today, you will receive an invoice from [place]. Please contact [facility] at [phone] with questions or concerns regarding your invoice.   Our billing staff will not be able to assist you with questions regarding bills from these companies.  You will be contacted with the lab results as soon as they are available. The fastest way to get your results is to activate your My Chart account. Instructions are located on the last page of this paperwork. If you have not heard from [place] regarding the results in 2 weeks, please contact this office.     Dementia Caregiver Guide Dementia is a term used to describe a number of symptoms that affect memory and thinking. The most common symptoms include:  Memory loss.  Trouble with language and communication.  Trouble concentrating.  Poor judgment.  Problems with reasoning.  Child-like behavior and language.  Extreme anxiety.  Angry outbursts.  Wandering from home or public places.  Dementia usually gets worse slowly over time. In the early stages, people with dementia can stay independent and safe with some help. In later stages, they need help with daily tasks such as dressing, grooming, and using the bathroom. How to help the person with dementia cope Dementia can be frightening and confusing. Here are some tips to help the person with dementia cope with changes caused by the disease. General tips  Keep the person on track with his or her routine.  Try to identify areas where the person may need help.  Be supportive, patient, calm, and encouraging.  Gently remind the person that adjusting to changes takes time.  Help with the tasks that the person has asked for help with.  Keep the person involved in daily tasks  and decisions as much as possible.  Encourage conversation, but try not to get frustrated or harried if the person struggles to find words or does not seem to appreciate your help. Communication tips  When the person is talking or seems frustrated, make eye contact and hold the person's hand.  Ask specific questions that need yes or no answers.  Use simple words, short sentences, and a calm voice. Only give one direction at a time.  When offering choices, limit them to just 1 or 2.  Avoid correcting the person in a negative way.  If the person is struggling to find the right words, gently try to help him or her. How to recognize symptoms of stress Symptoms of stress in caregivers include:  Feeling frustrated or angry with the person with dementia.  Denying that the person has dementia or that his or her symptoms will not improve.  Feeling hopeless and unappreciated.  Difficulty sleeping.  Difficulty concentrating.  Feeling anxious, irritable, or depressed.  Developing stress-related health problems.  Feeling like you have too little time for your own life.  Follow these instructions at home:  Make sure that you and the person you are caring for: ? Get regular sleep. ? Exercise regularly. ? Eat regular, nutritious meals. ? Drink enough fluid to keep your urine clear or pale yellow. ? Take over-the-counter and prescription medicines only as told by your health care providers. ? Attend all scheduled health care appointments.  Join a support group with others who are caregivers.  Ask about respite care resources so that  you can have a regular break from the stress of caregiving.  Look for signs of stress in yourself and in the person you are caring for. If you notice signs of stress, take steps to manage it.  Consider any safety risks and take steps to avoid them.  Organize medications in a pill box for each day of the week.  Create a plan to handle any legal or  financial matters. Get legal or financial advice if needed.  Keep a calendar in a central location to remind the person of appointments or other activities. Tips for reducing the risk of injury  Keep floors clear of clutter. Remove rugs, magazine racks, and floor lamps.  Keep hallways well lit, especially at night.  Put a handrail and nonslip mat in the bathtub or shower.  Put childproof locks on cabinets that contain dangerous items, such as medicines, alcohol, guns, toxic cleaning items, sharp tools or utensils, matches, and lighters.  Put the locks in places where the person cannot see or reach them easily. This will help ensure that the person does not wander out of the house and get lost.  Be prepared for emergencies. Keep a list of emergency phone numbers and addresses in a convenient area.  Remove car keys and lock garage doors so that the person does not try to get in the car and drive.  Have the person wear a bracelet that tracks locations and identifies the person as having memory problems. This should be worn at all times for safety. Where to find support: Many individuals and organizations offer support. These include:  Support groups for people with dementia and for caregivers.  Counselors or therapists.  Home health care services.  Adult day care centers.  Where to find more information: Alzheimer's Association: [employer] Contact a health care provider if:  The person's health is rapidly getting worse.  You are no longer able to care for the person.  Caring for the person is affecting your physical and emotional health.  The person threatens himself or herself, you, or anyone else. Summary  Dementia is a term used to describe a number of symptoms that affect memory and thinking.  Dementia usually gets worse slowly over time.  Take steps to reduce the person's risk of injury, and to plan for future care.  Caregivers need support, relief from  caregiving, and time for their own lives. This information is not intended to replace advice given to you by your health care provider. Make sure you discuss any questions you have with your health care provider. Document Released: 07/28/2016 Document Revised: 07/28/2016 Document Reviewed: 07/28/2016 Elsevier Interactive Patient Education  [employer].

## 2017-07-01 NOTE — Telephone Encounter (Signed)
Tillie Rung from [facility] called to request verbal orders for this patient for PT.  She would like PT 2 times/week for 4 weeks.  Please call her back and leave a detailed message as she is driving and she can not answer the phone.  Her number is [phone]

## 2017-07-01 NOTE — Telephone Encounter (Signed)
TA-Is this something that you are ok with me giving over the phone? Plz advise/thx dmf
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
## 2017-07-01 NOTE — Telephone Encounter (Signed)
Yes okay to give these verbal orders over the phone.

## 2017-07-02 NOTE — Telephone Encounter (Signed)
LMOVM for Katie Silva giving verbal orders for PT biweekly for 4 weeks per TA/thx dmf

## 2017-07-06 DIAGNOSIS — F0391 Unspecified dementia with behavioral disturbance: Secondary | ICD-10-CM | POA: Diagnosis not present

## 2017-07-06 DIAGNOSIS — M810 Age-related osteoporosis without current pathological fracture: Secondary | ICD-10-CM | POA: Diagnosis not present

## 2017-07-06 DIAGNOSIS — D649 Anemia, unspecified: Secondary | ICD-10-CM | POA: Diagnosis not present

## 2017-07-06 DIAGNOSIS — M21061 Valgus deformity, not elsewhere classified, right knee: Secondary | ICD-10-CM | POA: Diagnosis not present

## 2017-07-06 DIAGNOSIS — Z9981 Dependence on supplemental oxygen: Secondary | ICD-10-CM | POA: Diagnosis not present

## 2017-07-06 DIAGNOSIS — S51852D Open bite of left forearm, subsequent encounter: Secondary | ICD-10-CM | POA: Diagnosis not present

## 2017-07-06 DIAGNOSIS — M150 Primary generalized (osteo)arthritis: Secondary | ICD-10-CM | POA: Diagnosis not present

## 2017-07-06 DIAGNOSIS — I10 Essential (primary) hypertension: Secondary | ICD-10-CM | POA: Diagnosis not present

## 2017-07-08 DIAGNOSIS — M150 Primary generalized (osteo)arthritis: Secondary | ICD-10-CM | POA: Diagnosis not present

## 2017-07-08 DIAGNOSIS — I10 Essential (primary) hypertension: Secondary | ICD-10-CM | POA: Diagnosis not present

## 2017-07-08 DIAGNOSIS — S51852D Open bite of left forearm, subsequent encounter: Secondary | ICD-10-CM | POA: Diagnosis not present

## 2017-07-08 DIAGNOSIS — M21061 Valgus deformity, not elsewhere classified, right knee: Secondary | ICD-10-CM | POA: Diagnosis not present

## 2017-07-08 DIAGNOSIS — Z9981 Dependence on supplemental oxygen: Secondary | ICD-10-CM | POA: Diagnosis not present

## 2017-07-08 DIAGNOSIS — M810 Age-related osteoporosis without current pathological fracture: Secondary | ICD-10-CM | POA: Diagnosis not present

## 2017-07-08 DIAGNOSIS — D649 Anemia, unspecified: Secondary | ICD-10-CM | POA: Diagnosis not present

## 2017-07-08 DIAGNOSIS — F0391 Unspecified dementia with behavioral disturbance: Secondary | ICD-10-CM | POA: Diagnosis not present

## 2017-07-13 DIAGNOSIS — Z9981 Dependence on supplemental oxygen: Secondary | ICD-10-CM | POA: Diagnosis not present

## 2017-07-13 DIAGNOSIS — I10 Essential (primary) hypertension: Secondary | ICD-10-CM | POA: Diagnosis not present

## 2017-07-13 DIAGNOSIS — S51852D Open bite of left forearm, subsequent encounter: Secondary | ICD-10-CM | POA: Diagnosis not present

## 2017-07-13 DIAGNOSIS — M150 Primary generalized (osteo)arthritis: Secondary | ICD-10-CM | POA: Diagnosis not present

## 2017-07-13 DIAGNOSIS — F0391 Unspecified dementia with behavioral disturbance: Secondary | ICD-10-CM | POA: Diagnosis not present

## 2017-07-13 DIAGNOSIS — M21061 Valgus deformity, not elsewhere classified, right knee: Secondary | ICD-10-CM | POA: Diagnosis not present

## 2017-07-13 DIAGNOSIS — M810 Age-related osteoporosis without current pathological fracture: Secondary | ICD-10-CM | POA: Diagnosis not present

## 2017-07-13 DIAGNOSIS — D649 Anemia, unspecified: Secondary | ICD-10-CM | POA: Diagnosis not present

## 2017-07-14 ENCOUNTER — Telehealth: Payer: Self-pay

## 2017-07-14 NOTE — Telephone Encounter (Signed)
Okay to give verbal orders as requested. 

## 2017-07-14 NOTE — Telephone Encounter (Signed)
Tillie Rung PT with [facility] requesting verbal orders for [facility] speech therapy eval and treat; pts memory is not good, confusion started one month ago and now there are safety issues that [facility] wants eval thru speech therapy.Please advise.

## 2017-07-15 DIAGNOSIS — D649 Anemia, unspecified: Secondary | ICD-10-CM | POA: Diagnosis not present

## 2017-07-15 DIAGNOSIS — M21061 Valgus deformity, not elsewhere classified, right knee: Secondary | ICD-10-CM | POA: Diagnosis not present

## 2017-07-15 DIAGNOSIS — S51852D Open bite of left forearm, subsequent encounter: Secondary | ICD-10-CM | POA: Diagnosis not present

## 2017-07-15 DIAGNOSIS — F0391 Unspecified dementia with behavioral disturbance: Secondary | ICD-10-CM | POA: Diagnosis not present

## 2017-07-15 DIAGNOSIS — I10 Essential (primary) hypertension: Secondary | ICD-10-CM | POA: Diagnosis not present

## 2017-07-15 DIAGNOSIS — Z9981 Dependence on supplemental oxygen: Secondary | ICD-10-CM | POA: Diagnosis not present

## 2017-07-15 DIAGNOSIS — M150 Primary generalized (osteo)arthritis: Secondary | ICD-10-CM | POA: Diagnosis not present

## 2017-07-15 DIAGNOSIS — M810 Age-related osteoporosis without current pathological fracture: Secondary | ICD-10-CM | POA: Diagnosis not present

## 2017-07-15 NOTE — Telephone Encounter (Signed)
Verbal orders given to [facility] per TA/thx dmf

## 2017-07-19 ENCOUNTER — Ambulatory Visit: Payer: Self-pay

## 2017-07-19 ENCOUNTER — Telehealth: Payer: Self-pay

## 2017-07-19 DIAGNOSIS — M21061 Valgus deformity, not elsewhere classified, right knee: Secondary | ICD-10-CM | POA: Diagnosis not present

## 2017-07-19 DIAGNOSIS — M150 Primary generalized (osteo)arthritis: Secondary | ICD-10-CM | POA: Diagnosis not present

## 2017-07-19 DIAGNOSIS — Z9981 Dependence on supplemental oxygen: Secondary | ICD-10-CM | POA: Diagnosis not present

## 2017-07-19 DIAGNOSIS — D649 Anemia, unspecified: Secondary | ICD-10-CM | POA: Diagnosis not present

## 2017-07-19 DIAGNOSIS — S51852D Open bite of left forearm, subsequent encounter: Secondary | ICD-10-CM | POA: Diagnosis not present

## 2017-07-19 DIAGNOSIS — F0391 Unspecified dementia with behavioral disturbance: Secondary | ICD-10-CM | POA: Diagnosis not present

## 2017-07-19 DIAGNOSIS — M810 Age-related osteoporosis without current pathological fracture: Secondary | ICD-10-CM | POA: Diagnosis not present

## 2017-07-19 DIAGNOSIS — I10 Essential (primary) hypertension: Secondary | ICD-10-CM | POA: Diagnosis not present

## 2017-07-19 NOTE — Telephone Encounter (Signed)
   Reason for Disposition . Wants doctor to measure BP  Answer Assessment - Initial Assessment Questions 1. BLOOD PRESSURE: "What is the blood pressure?" "Did you take at least two measurements 5 minutes apart?"     190/80 - after rest 175/75 2. ONSET: "When did you take your blood pressure?"     This afternoon 3. HOW: "How did you obtain the blood pressure?" (e.g., visiting nurse, automatic home BP monitor)     Home BP monitor 4. HISTORY: "Do you have a history of high blood pressure?"     Yes 5. MEDICATIONS: "Are you taking any medications for blood pressure?" "Have you missed any doses recently?"      Takes medication and family reports has not missed any doses 6. OTHER SYMPTOMS: "Do you have any symptoms?" (e.g., headache, chest pain, blurred vision, difficulty breathing, weakness)     No other symptoms 7. PREGNANCY: "Is there any chance you are pregnant?" "When was your last menstrual period?"     No  Protocols used: HIGH BLOOD PRESSURE-A-AH  Family keeps written log of BP - Ranges systolic 333-832  Diastolic 91-91.

## 2017-07-19 NOTE — Telephone Encounter (Signed)
I called [facility] at [phone] and spoke with [place]; Elmyra Ricks PT assistant contact # is [phone].

## 2017-07-19 NOTE — Telephone Encounter (Signed)
Copied from [place] [id number]. Topic: Inquiry >> Jul 19, 2017 12:34 PM Pricilla Handler wrote: Reason for CRM: Elmyra Ricks from [facility] called regarding the patient. Elmyra Ricks wants Dr. Deborra Medina to call ASAP. Patient's blood pressure is 190/80. Elmyra Ricks stated that after patient rested 15 minutes and drank 8 ounces of water, her BP was 175/80. Elmyra Ricks is very concerned about the patient and that her blood pressure medicine may not be working.

## 2017-07-19 NOTE — Telephone Encounter (Signed)
Is there a number for me to return her call?

## 2017-07-19 NOTE — Telephone Encounter (Signed)
Left VM for Elmyra Ricks to return my call.

## 2017-07-21 DIAGNOSIS — S51852D Open bite of left forearm, subsequent encounter: Secondary | ICD-10-CM | POA: Diagnosis not present

## 2017-07-21 DIAGNOSIS — Z9981 Dependence on supplemental oxygen: Secondary | ICD-10-CM | POA: Diagnosis not present

## 2017-07-21 DIAGNOSIS — D649 Anemia, unspecified: Secondary | ICD-10-CM | POA: Diagnosis not present

## 2017-07-21 DIAGNOSIS — M810 Age-related osteoporosis without current pathological fracture: Secondary | ICD-10-CM | POA: Diagnosis not present

## 2017-07-21 DIAGNOSIS — F0391 Unspecified dementia with behavioral disturbance: Secondary | ICD-10-CM | POA: Diagnosis not present

## 2017-07-21 DIAGNOSIS — I10 Essential (primary) hypertension: Secondary | ICD-10-CM | POA: Diagnosis not present

## 2017-07-21 DIAGNOSIS — M150 Primary generalized (osteo)arthritis: Secondary | ICD-10-CM | POA: Diagnosis not present

## 2017-07-21 DIAGNOSIS — M21061 Valgus deformity, not elsewhere classified, right knee: Secondary | ICD-10-CM | POA: Diagnosis not present

## 2017-07-22 DIAGNOSIS — S51852D Open bite of left forearm, subsequent encounter: Secondary | ICD-10-CM | POA: Diagnosis not present

## 2017-07-22 DIAGNOSIS — M150 Primary generalized (osteo)arthritis: Secondary | ICD-10-CM | POA: Diagnosis not present

## 2017-07-22 DIAGNOSIS — Z9981 Dependence on supplemental oxygen: Secondary | ICD-10-CM | POA: Diagnosis not present

## 2017-07-22 DIAGNOSIS — M810 Age-related osteoporosis without current pathological fracture: Secondary | ICD-10-CM | POA: Diagnosis not present

## 2017-07-22 DIAGNOSIS — I10 Essential (primary) hypertension: Secondary | ICD-10-CM | POA: Diagnosis not present

## 2017-07-22 DIAGNOSIS — D649 Anemia, unspecified: Secondary | ICD-10-CM | POA: Diagnosis not present

## 2017-07-22 DIAGNOSIS — M21061 Valgus deformity, not elsewhere classified, right knee: Secondary | ICD-10-CM | POA: Diagnosis not present

## 2017-07-22 DIAGNOSIS — F0391 Unspecified dementia with behavioral disturbance: Secondary | ICD-10-CM | POA: Diagnosis not present

## 2017-07-22 NOTE — Telephone Encounter (Signed)
Patients daughter calling today to check on this form. [facility] not [facility] the form and she has not been called to come pick the form up in office. Please call daughter with an update.

## 2017-07-22 NOTE — Telephone Encounter (Signed)
RI-You will have to call the pt and have her resubmit the form as TA does not recall this/We do not have a phone, fax, or printer right now/plz advise/thx dmf

## 2017-07-22 NOTE — Telephone Encounter (Addendum)
I spoke with pts daughter Linda,DPR signed and advised needs another [facility] form; [name] will get form and bring to the [place] office the week of 07/26/17 to be filled out by Dr Deborra Medina. (only lives few miles from [place]). FYI to [place].

## 2017-07-23 ENCOUNTER — Telehealth: Payer: Self-pay

## 2017-07-23 NOTE — Telephone Encounter (Signed)
Left detailed v/m with verbal orders for [facility] speech therapy as instructed.

## 2017-07-23 NOTE — Telephone Encounter (Signed)
Menda Shephard speech therapist with [facility] left v/m requesting verbal orders for [facility] speech therapy 2 x a week for 4 weeks for cognitive communication deficits related to dementia.

## 2017-07-23 NOTE — Telephone Encounter (Signed)
Okay to give verbal orders as requested. 

## 2017-07-27 DIAGNOSIS — F0391 Unspecified dementia with behavioral disturbance: Secondary | ICD-10-CM | POA: Diagnosis not present

## 2017-07-27 DIAGNOSIS — S51852D Open bite of left forearm, subsequent encounter: Secondary | ICD-10-CM | POA: Diagnosis not present

## 2017-07-27 DIAGNOSIS — I10 Essential (primary) hypertension: Secondary | ICD-10-CM | POA: Diagnosis not present

## 2017-07-27 DIAGNOSIS — M21061 Valgus deformity, not elsewhere classified, right knee: Secondary | ICD-10-CM | POA: Diagnosis not present

## 2017-07-27 DIAGNOSIS — D649 Anemia, unspecified: Secondary | ICD-10-CM | POA: Diagnosis not present

## 2017-07-27 DIAGNOSIS — M150 Primary generalized (osteo)arthritis: Secondary | ICD-10-CM | POA: Diagnosis not present

## 2017-07-27 DIAGNOSIS — Z9981 Dependence on supplemental oxygen: Secondary | ICD-10-CM | POA: Diagnosis not present

## 2017-07-27 DIAGNOSIS — M810 Age-related osteoporosis without current pathological fracture: Secondary | ICD-10-CM | POA: Diagnosis not present

## 2017-08-02 ENCOUNTER — Ambulatory Visit (INDEPENDENT_AMBULATORY_CARE_PROVIDER_SITE_OTHER): Payer: Medicare PPO | Admitting: Behavioral Health

## 2017-08-02 DIAGNOSIS — Z23 Encounter for immunization: Secondary | ICD-10-CM | POA: Diagnosis not present

## 2017-08-02 NOTE — Progress Notes (Signed)
Patient came in clinic today for influenza vaccination. IM injection was given in the right deltoid. Patient tolerated the injection well.

## 2017-08-04 DIAGNOSIS — Z961 Presence of intraocular lens: Secondary | ICD-10-CM | POA: Diagnosis not present

## 2017-08-04 DIAGNOSIS — H40013 Open angle with borderline findings, low risk, bilateral: Secondary | ICD-10-CM | POA: Diagnosis not present

## 2017-08-04 DIAGNOSIS — M150 Primary generalized (osteo)arthritis: Secondary | ICD-10-CM | POA: Diagnosis not present

## 2017-08-04 DIAGNOSIS — F0391 Unspecified dementia with behavioral disturbance: Secondary | ICD-10-CM | POA: Diagnosis not present

## 2017-08-04 DIAGNOSIS — S51852D Open bite of left forearm, subsequent encounter: Secondary | ICD-10-CM | POA: Diagnosis not present

## 2017-08-04 DIAGNOSIS — H35033 Hypertensive retinopathy, bilateral: Secondary | ICD-10-CM | POA: Diagnosis not present

## 2017-08-04 DIAGNOSIS — M810 Age-related osteoporosis without current pathological fracture: Secondary | ICD-10-CM | POA: Diagnosis not present

## 2017-08-04 DIAGNOSIS — H35362 Drusen (degenerative) of macula, left eye: Secondary | ICD-10-CM | POA: Diagnosis not present

## 2017-08-04 DIAGNOSIS — I10 Essential (primary) hypertension: Secondary | ICD-10-CM | POA: Diagnosis not present

## 2017-08-04 DIAGNOSIS — M21061 Valgus deformity, not elsewhere classified, right knee: Secondary | ICD-10-CM | POA: Diagnosis not present

## 2017-08-04 DIAGNOSIS — D649 Anemia, unspecified: Secondary | ICD-10-CM | POA: Diagnosis not present

## 2017-08-04 DIAGNOSIS — Z9981 Dependence on supplemental oxygen: Secondary | ICD-10-CM | POA: Diagnosis not present

## 2017-08-04 LAB — HM DIABETES EYE EXAM

## 2017-08-06 DIAGNOSIS — M810 Age-related osteoporosis without current pathological fracture: Secondary | ICD-10-CM | POA: Diagnosis not present

## 2017-08-06 DIAGNOSIS — M21061 Valgus deformity, not elsewhere classified, right knee: Secondary | ICD-10-CM | POA: Diagnosis not present

## 2017-08-06 DIAGNOSIS — Z9981 Dependence on supplemental oxygen: Secondary | ICD-10-CM | POA: Diagnosis not present

## 2017-08-06 DIAGNOSIS — M150 Primary generalized (osteo)arthritis: Secondary | ICD-10-CM | POA: Diagnosis not present

## 2017-08-06 DIAGNOSIS — I10 Essential (primary) hypertension: Secondary | ICD-10-CM | POA: Diagnosis not present

## 2017-08-06 DIAGNOSIS — S51852D Open bite of left forearm, subsequent encounter: Secondary | ICD-10-CM | POA: Diagnosis not present

## 2017-08-06 DIAGNOSIS — D649 Anemia, unspecified: Secondary | ICD-10-CM | POA: Diagnosis not present

## 2017-08-06 DIAGNOSIS — F0391 Unspecified dementia with behavioral disturbance: Secondary | ICD-10-CM | POA: Diagnosis not present

## 2017-08-10 DIAGNOSIS — F0391 Unspecified dementia with behavioral disturbance: Secondary | ICD-10-CM | POA: Diagnosis not present

## 2017-08-10 DIAGNOSIS — I10 Essential (primary) hypertension: Secondary | ICD-10-CM | POA: Diagnosis not present

## 2017-08-10 DIAGNOSIS — M810 Age-related osteoporosis without current pathological fracture: Secondary | ICD-10-CM | POA: Diagnosis not present

## 2017-08-10 DIAGNOSIS — D649 Anemia, unspecified: Secondary | ICD-10-CM | POA: Diagnosis not present

## 2017-08-10 DIAGNOSIS — M21061 Valgus deformity, not elsewhere classified, right knee: Secondary | ICD-10-CM | POA: Diagnosis not present

## 2017-08-10 DIAGNOSIS — M150 Primary generalized (osteo)arthritis: Secondary | ICD-10-CM | POA: Diagnosis not present

## 2017-08-10 DIAGNOSIS — S51852D Open bite of left forearm, subsequent encounter: Secondary | ICD-10-CM | POA: Diagnosis not present

## 2017-08-10 DIAGNOSIS — Z9981 Dependence on supplemental oxygen: Secondary | ICD-10-CM | POA: Diagnosis not present

## 2017-08-11 DIAGNOSIS — F0391 Unspecified dementia with behavioral disturbance: Secondary | ICD-10-CM | POA: Diagnosis not present

## 2017-08-11 DIAGNOSIS — M150 Primary generalized (osteo)arthritis: Secondary | ICD-10-CM | POA: Diagnosis not present

## 2017-08-11 DIAGNOSIS — I10 Essential (primary) hypertension: Secondary | ICD-10-CM | POA: Diagnosis not present

## 2017-08-11 DIAGNOSIS — Z9981 Dependence on supplemental oxygen: Secondary | ICD-10-CM | POA: Diagnosis not present

## 2017-08-11 DIAGNOSIS — M810 Age-related osteoporosis without current pathological fracture: Secondary | ICD-10-CM | POA: Diagnosis not present

## 2017-08-11 DIAGNOSIS — S51852D Open bite of left forearm, subsequent encounter: Secondary | ICD-10-CM | POA: Diagnosis not present

## 2017-08-11 DIAGNOSIS — D649 Anemia, unspecified: Secondary | ICD-10-CM | POA: Diagnosis not present

## 2017-08-11 DIAGNOSIS — M21061 Valgus deformity, not elsewhere classified, right knee: Secondary | ICD-10-CM | POA: Diagnosis not present

## 2017-08-13 DIAGNOSIS — M810 Age-related osteoporosis without current pathological fracture: Secondary | ICD-10-CM | POA: Diagnosis not present

## 2017-08-13 DIAGNOSIS — I10 Essential (primary) hypertension: Secondary | ICD-10-CM | POA: Diagnosis not present

## 2017-08-13 DIAGNOSIS — F0391 Unspecified dementia with behavioral disturbance: Secondary | ICD-10-CM | POA: Diagnosis not present

## 2017-08-13 DIAGNOSIS — D649 Anemia, unspecified: Secondary | ICD-10-CM | POA: Diagnosis not present

## 2017-08-13 DIAGNOSIS — M21061 Valgus deformity, not elsewhere classified, right knee: Secondary | ICD-10-CM | POA: Diagnosis not present

## 2017-08-13 DIAGNOSIS — Z9981 Dependence on supplemental oxygen: Secondary | ICD-10-CM | POA: Diagnosis not present

## 2017-08-13 DIAGNOSIS — S51852D Open bite of left forearm, subsequent encounter: Secondary | ICD-10-CM | POA: Diagnosis not present

## 2017-08-13 DIAGNOSIS — M150 Primary generalized (osteo)arthritis: Secondary | ICD-10-CM | POA: Diagnosis not present

## 2017-08-18 ENCOUNTER — Telehealth: Payer: Self-pay

## 2017-08-18 DIAGNOSIS — M810 Age-related osteoporosis without current pathological fracture: Secondary | ICD-10-CM | POA: Diagnosis not present

## 2017-08-18 DIAGNOSIS — M21061 Valgus deformity, not elsewhere classified, right knee: Secondary | ICD-10-CM | POA: Diagnosis not present

## 2017-08-18 DIAGNOSIS — I10 Essential (primary) hypertension: Secondary | ICD-10-CM | POA: Diagnosis not present

## 2017-08-18 DIAGNOSIS — Z9981 Dependence on supplemental oxygen: Secondary | ICD-10-CM | POA: Diagnosis not present

## 2017-08-18 DIAGNOSIS — S51852D Open bite of left forearm, subsequent encounter: Secondary | ICD-10-CM | POA: Diagnosis not present

## 2017-08-18 DIAGNOSIS — F0391 Unspecified dementia with behavioral disturbance: Secondary | ICD-10-CM | POA: Diagnosis not present

## 2017-08-18 DIAGNOSIS — M150 Primary generalized (osteo)arthritis: Secondary | ICD-10-CM | POA: Diagnosis not present

## 2017-08-18 DIAGNOSIS — D649 Anemia, unspecified: Secondary | ICD-10-CM | POA: Diagnosis not present

## 2017-08-18 NOTE — Telephone Encounter (Signed)
Copied from [place]. Topic: General - Other >> Aug 18, 2017 11:11 AM Carolyn Stare wrote:    Colletta Maryland with [facility] call to say pt left leg has plus 3 pitting adema for several weeks.     Would like a call back     [phone]

## 2017-08-19 NOTE — Telephone Encounter (Signed)
Does she have any other symptoms, ie shortness of breath?

## 2017-08-19 NOTE — Telephone Encounter (Signed)
TA-I spoke with Katie Silva with [facility]/she denies any discoloration to leg/LLE 3+ pitting edema/has advised pt and family to elevate leg but she is non-compliant/she is taking her current medications but I do not see that anything includes a diuretic/Plz advise if there is anything that should be done/thx dmf

## 2017-08-20 DIAGNOSIS — D649 Anemia, unspecified: Secondary | ICD-10-CM | POA: Diagnosis not present

## 2017-08-20 DIAGNOSIS — F0391 Unspecified dementia with behavioral disturbance: Secondary | ICD-10-CM | POA: Diagnosis not present

## 2017-08-20 DIAGNOSIS — Z9981 Dependence on supplemental oxygen: Secondary | ICD-10-CM | POA: Diagnosis not present

## 2017-08-20 DIAGNOSIS — S51852D Open bite of left forearm, subsequent encounter: Secondary | ICD-10-CM | POA: Diagnosis not present

## 2017-08-20 DIAGNOSIS — M21061 Valgus deformity, not elsewhere classified, right knee: Secondary | ICD-10-CM | POA: Diagnosis not present

## 2017-08-20 DIAGNOSIS — M150 Primary generalized (osteo)arthritis: Secondary | ICD-10-CM | POA: Diagnosis not present

## 2017-08-20 DIAGNOSIS — M810 Age-related osteoporosis without current pathological fracture: Secondary | ICD-10-CM | POA: Diagnosis not present

## 2017-08-20 DIAGNOSIS — I10 Essential (primary) hypertension: Secondary | ICD-10-CM | POA: Diagnosis not present

## 2017-08-20 NOTE — Telephone Encounter (Signed)
Given her age, if she has no other symptoms, I would not add a diuretic at this time.  We want to preserve her kidney function,  I would try to get her to be more compliant with compression hose and elevation first.

## 2017-08-20 NOTE — Telephone Encounter (Signed)
TA-No other Sx/plz advise/thx dmf

## 2017-08-20 NOTE — Telephone Encounter (Signed)
Stephanie @ [facility] is aware of TA's rec/thx dmf

## 2017-08-25 DIAGNOSIS — Z9981 Dependence on supplemental oxygen: Secondary | ICD-10-CM | POA: Diagnosis not present

## 2017-08-25 DIAGNOSIS — D649 Anemia, unspecified: Secondary | ICD-10-CM | POA: Diagnosis not present

## 2017-08-25 DIAGNOSIS — M21061 Valgus deformity, not elsewhere classified, right knee: Secondary | ICD-10-CM | POA: Diagnosis not present

## 2017-08-25 DIAGNOSIS — M810 Age-related osteoporosis without current pathological fracture: Secondary | ICD-10-CM | POA: Diagnosis not present

## 2017-08-25 DIAGNOSIS — S51852D Open bite of left forearm, subsequent encounter: Secondary | ICD-10-CM | POA: Diagnosis not present

## 2017-08-25 DIAGNOSIS — I10 Essential (primary) hypertension: Secondary | ICD-10-CM | POA: Diagnosis not present

## 2017-08-25 DIAGNOSIS — M150 Primary generalized (osteo)arthritis: Secondary | ICD-10-CM | POA: Diagnosis not present

## 2017-08-25 DIAGNOSIS — F0391 Unspecified dementia with behavioral disturbance: Secondary | ICD-10-CM | POA: Diagnosis not present

## 2017-08-27 DIAGNOSIS — I10 Essential (primary) hypertension: Secondary | ICD-10-CM | POA: Diagnosis not present

## 2017-08-27 DIAGNOSIS — M150 Primary generalized (osteo)arthritis: Secondary | ICD-10-CM | POA: Diagnosis not present

## 2017-08-27 DIAGNOSIS — M21061 Valgus deformity, not elsewhere classified, right knee: Secondary | ICD-10-CM | POA: Diagnosis not present

## 2017-08-27 DIAGNOSIS — M810 Age-related osteoporosis without current pathological fracture: Secondary | ICD-10-CM | POA: Diagnosis not present

## 2017-08-27 DIAGNOSIS — S51852D Open bite of left forearm, subsequent encounter: Secondary | ICD-10-CM | POA: Diagnosis not present

## 2017-08-27 DIAGNOSIS — F0391 Unspecified dementia with behavioral disturbance: Secondary | ICD-10-CM | POA: Diagnosis not present

## 2017-08-27 DIAGNOSIS — D649 Anemia, unspecified: Secondary | ICD-10-CM | POA: Diagnosis not present

## 2017-08-27 DIAGNOSIS — Z9981 Dependence on supplemental oxygen: Secondary | ICD-10-CM | POA: Diagnosis not present

## 2017-09-13 ENCOUNTER — Encounter: Payer: Self-pay | Admitting: Family Medicine

## 2017-09-13 ENCOUNTER — Ambulatory Visit: Payer: Medicare PPO | Admitting: Family Medicine

## 2017-09-13 VITALS — BP 132/64 | HR 74 | Temp 97.8°F | Wt 134.2 lb

## 2017-09-13 DIAGNOSIS — R600 Localized edema: Secondary | ICD-10-CM

## 2017-09-13 DIAGNOSIS — S61412A Laceration without foreign body of left hand, initial encounter: Secondary | ICD-10-CM | POA: Diagnosis not present

## 2017-09-13 MED ORDER — TRAZODONE HCL 50 MG PO TABS: 25.0000 mg | ORAL_TABLET | Freq: Every evening | ORAL | 3 refills | Status: DC | PRN

## 2017-09-13 NOTE — Patient Instructions (Signed)
Edema Edema is when you have too much fluid in your body or under your skin. Edema may make your legs, feet, and ankles swell up. Swelling is also common in looser tissues, like around your eyes. This is a common condition. It gets more common as you get older. There are many possible causes of edema. Eating too much salt (sodium) and being on your feet or sitting for a long time can cause edema in your legs, feet, and ankles. Hot weather may make edema worse. Edema is usually painless. Your skin may look swollen or shiny. Follow these instructions at home:  Keep the swollen body part raised (elevated) above the level of your heart when you are sitting or lying down.  Do not sit still or stand for a long time.  Do not wear tight clothes. Do not wear garters on your upper legs.  Exercise your legs. This can help the swelling go down.  Wear elastic bandages or support stockings as told by your doctor.  Eat a low-salt (low-sodium) diet to reduce fluid as told by your doctor.  Depending on the cause of your swelling, you may need to limit how much fluid you drink (fluid restriction).  Take over-the-counter and prescription medicines only as told by your doctor. Contact a doctor if:  Treatment is not working.  You have heart, liver, or kidney disease and have symptoms of edema.  You have sudden and unexplained weight gain. Get help right away if:  You have shortness of breath or chest pain.  You cannot breathe when you lie down.  You have pain, redness, or warmth in the swollen areas.  You have heart, liver, or kidney disease and get edema all of a sudden.  You have a fever and your symptoms get worse all of a sudden. Summary  Edema is when you have too much fluid in your body or under your skin.  Edema may make your legs, feet, and ankles swell up. Swelling is also common in looser tissues, like around your eyes.  Raise (elevate) the swollen body part above the level of your  heart when you are sitting or lying down.  Follow your doctor's instructions about diet and how much fluid you can drink (fluid restriction). This information is not intended to replace advice given to you by your health care provider. Make sure you discuss any questions you have with your health care provider. Document Released: 02/10/2008 Document Revised: 09/11/2016 Document Reviewed: 09/11/2016 Elsevier Interactive Patient Education  2017 Elsevier Inc.  

## 2017-09-13 NOTE — Assessment & Plan Note (Signed)
No signs of infection. Area dressed today.

## 2017-09-13 NOTE — Assessment & Plan Note (Signed)
Reassuring provided. Likely dependent edema. Discussed importance of keeping legs elevated when seated.

## 2017-09-13 NOTE — Progress Notes (Signed)
Subjective:   Patient ID: Katie Silva, female    DOB: Sep 23, 1925, 84 y.o.   MRN: [id number]  Katie Silva is a pleasant 84 y.o. year old female who presents to clinic today with Leg Swelling (Patient is here today C/O left leg swelling.  Has been going on a while and by night time the swelling is much worse.  They have bought bigger shoes to compensate for it.  Had a left hip Fx and is asking if the swelling could be due to this.) and Fall (She also fell on 1.2.19 while in the bathroom.  Her left hand split open on top.  There is swelling and redness.  There is someone with her 24/7 now.  They are also trying to get her into [facility] and [facility] is helping.)  on 09/13/2017  HPI:  Here with her daughter.  Now has 24/7 in home care.  Left leg swelling- ongoing issue.  Feels it gets worse at night.  She actually has had to buy larger shoes because of this.    H/o left hip fx- wonders if swelling is coming from this.  She also fell on 1/2 - tripped in the bathroom and cut the top of her left hand. No pain but she does have a skin tear.  Current Outpatient Medications on File Prior to Visit  Medication Sig Dispense Refill  . glucosamine-chondroitin 500-400 MG tablet Take 1 tablet by mouth 3 (three) times daily.    [name] lisinopril (PRINIVIL,ZESTRIL) 10 MG tablet Take 1 tablet (10 mg total) by mouth daily. 30 tablet 3  . Misc Natural Products (OSTEO BI-FLEX JOINT SHIELD PO) Take 1 tablet by mouth daily.    . Multiple Vitamin (MULTIVITAMIN WITH MINERALS) TABS tablet Take 1 tablet by mouth daily.    . silver sulfADIAZINE (SILVADENE) 1 % cream Apply 1 application topically daily. 50 g 0  . trolamine salicylate (ASPERCREME) 10 % cream Apply 1 application topically daily as needed (knee pain).     No current facility-administered medications on file prior to visit.     Allergies  Allergen Reactions  . Fexofenadine Hcl Other (See Comments)    Doesn't agree with patient    . Aricept [Donepezil Hcl] Other (See Comments)    nightmares  . Penicillins Other (See Comments)    Pt does not remember reaction to penicillin ("did not work")  . Tylenol [Acetaminophen] Other (See Comments)    "funny feeling in head."    Past Medical History:  Diagnosis Date  . Anemia   . Arthritis    hands, knees  . Breast cancer, left breast ([place]) 2003   s/p mastectomy, chemo.  Dr. Chancy Milroy oncologist  . Fracture of humerus, proximal, right, closed 07/2010   s/p hemiarthroplasty  . Gastric ulcer with hemorrhage 02/14/2013  . Helicobacter pylori (H. pylori) infection 02/15/2013   eradication confirmed 05/2013 [facility]  . Hypertension   . Multiple duodenal ulcers 02/14/2013  . Osteoporosis     Past Surgical History:  Procedure Laterality Date  . ABDOMINAL HYSTERECTOMY    . BREATH TEK H PYLORI N/A 06/06/2013   Procedure: BREATH TEK H PYLORI;  Surgeon: Gatha Mayer, MD;  Location: [facility];  Service: Endoscopy;  Laterality: N/A;  . ESOPHAGOGASTRODUODENOSCOPY N/A 02/14/2013   Procedure: ESOPHAGOGASTRODUODENOSCOPY (EGD);  Surgeon: Gatha Mayer, MD;  Location: [facility];  Service: Endoscopy;  Laterality: N/A;  . FEMUR IM NAIL  04/13/2012   Procedure: INTRAMEDULLARY (IM) NAIL FEMORAL;  Surgeon:  Melina Schools, MD;  Location: [facility];  Service: Orthopedics;  Laterality: Left;  . HIP SURGERY    . KNEE ARTHROSCOPY Bilateral    [facility]lateral knees at [facility]  . MASTECTOMY Left   . SHOULDER HEMI-ARTHROPLASTY Right 07/2010   Dr. Mardelle Matte    Family History  Problem Relation Age of Onset  . Myasthenia gravis Mother   . Coronary artery disease Father   . Colon cancer Sister     Social History   Socioeconomic History  . Marital status: Widowed    Spouse name: Not on file  . Number of children: 6  . Years of education: Not on file  . Highest education level: Not on file  Social Needs  . Financial resource strain: Not on file  . Food insecurity - worry: Not on file  . Food insecurity -  inability: Not on file  . Transportation needs - medical: Not on file  . Transportation needs - non-medical: Not on file  Occupational History  . Occupation: retired    Fish farm manager: RETIRED  Tobacco Use  . Smoking status: Never Smoker  . Smokeless tobacco: Never Used  Substance and Sexual Activity  . Alcohol use: No  . Drug use: No  . Sexual activity: Not Currently  Other Topics Concern  . Not on file  Social History Narrative   Now has 24/7 in home care   The PMH, [place], Social History, Family History, Medications, and allergies have been reviewed in [facility], and have been updated if relevant.   Review of Systems  Constitutional: Negative.   Respiratory: Negative for shortness of breath and wheezing.   Cardiovascular: Positive for leg swelling. Negative for chest pain and palpitations.  All other systems reviewed and are negative.      Objective:    BP 132/64 (BP Location: Right Arm, Patient Position: Sitting, Cuff Size: Normal)   Pulse 74   Temp 97.8 F (36.6 C) (Oral)   Wt 134 lb 3.2 oz (60.9 kg)   SpO2 95%   BMI 23.77 kg/m    Physical Exam  Constitutional: She is oriented to person, place, and time. She appears well-developed and well-nourished. No distress.  HENT:  Head: Normocephalic and atraumatic.  Eyes: Conjunctivae are normal.  Cardiovascular: Normal rate.  Pulmonary/Chest: Effort normal.  Musculoskeletal: Normal range of motion.  + 1 pitting edema left, no erythema, weeping or warmth  Neurological: She is alert and oriented to person, place, and time. No cranial nerve deficit.  Skin: She is not diaphoretic.     Psychiatric: She has a normal mood and affect. Her behavior is normal. Judgment and thought content normal.  Nursing note and vitals reviewed.         Assessment & Plan:   Pedal edema No Follow-up on file.

## 2017-09-29 ENCOUNTER — Telehealth: Payer: Self-pay

## 2017-09-29 NOTE — Telephone Encounter (Signed)
Forms completed,faxed,copy made to scan, I called daughter Katie Silva to advise that this has been taken care of and the originals are at the front for her to P/U/thx dmf

## 2017-11-16 ENCOUNTER — Other Ambulatory Visit: Payer: Self-pay

## 2017-11-16 MED ORDER — TRAZODONE HCL 50 MG PO TABS: 25.0000 mg | ORAL_TABLET | Freq: Every evening | ORAL | 2 refills | Status: DC | PRN

## 2017-11-16 MED ORDER — LISINOPRIL 10 MG PO TABS: 10.0000 mg | ORAL_TABLET | Freq: Every day | ORAL | 2 refills | Status: DC

## 2017-11-26 ENCOUNTER — Telehealth: Payer: Self-pay | Admitting: Family Medicine

## 2017-11-26 NOTE — Telephone Encounter (Signed)
Patient daughter came into the office. Every time patient gets up she has to go the bathroom and sometimes she is not able to make it to the bathroom. No burning or itching and urine has a strong order.  Is there anything that can be prescribed or does she need to bring her in, per patient daughter.  Please advise.

## 2017-11-26 NOTE — Telephone Encounter (Signed)
Yes please bring her in to discuss.

## 2017-11-29 ENCOUNTER — Ambulatory Visit: Payer: Medicare PPO | Admitting: Family Medicine

## 2017-11-29 ENCOUNTER — Encounter: Payer: Self-pay | Admitting: Family Medicine

## 2017-11-29 DIAGNOSIS — R350 Frequency of micturition: Secondary | ICD-10-CM | POA: Insufficient documentation

## 2017-11-29 LAB — POCT URINALYSIS DIPSTICK
Bilirubin, UA: NEGATIVE
Blood, UA: NEGATIVE
Glucose, UA: NEGATIVE
Ketones, UA: NEGATIVE
LEUKOCYTES UA: NEGATIVE
NITRITE UA: NEGATIVE
Protein, UA: NEGATIVE
Spec Grav, UA: >=1.03 — AB (ref 1.010–1.025)
Urobilinogen, UA: 0.2 E.U./dL
pH, UA: 6 (ref 5.0–8.0)

## 2017-11-29 NOTE — Telephone Encounter (Signed)
I have called and scheduled appointment. Patient has appointment to see you today 11/29/17 @ 4pm

## 2017-11-29 NOTE — Progress Notes (Signed)
Subjective:   Patient ID: Katie Silva, female    DOB: [birth year], 84 y.o.   MRN: [id number]  Katie Silva is a pleasant 84 y.o. year old female who presents to clinic today with Urinary Frequency (Patient is here today C/O urinary frequency.  Daughter states that in the mornings she states that she has pain in right lower quadrant of abd.  Says that sleeps on her right side at night.  After she gets going in the morning she is fine. Denies any dysuria.) and Other (Daughter would like for Dr. Deborra Medina to reinforce that she needs to have someone with her at all times.  Refuses to wear the necklace.  She fusses that she does her own cooking etc. )  on 11/29/2017  HPI:  Urinary frequency- constant issue- especially in the morning. Daughter wants to make sure not a UTI. Denies dysuria.  Feels this has been ongoing for weeks.  Does have accidents at night and is refusing to wear depends.  Current Outpatient Medications on File Prior to Visit  Medication Sig Dispense Refill  . glucosamine-chondroitin 500-400 MG tablet Take 1 tablet by mouth 3 (three) times daily.    [name] lisinopril (PRINIVIL,ZESTRIL) 10 MG tablet Take 1 tablet (10 mg total) by mouth daily. 90 tablet 2  . Misc Natural Products (OSTEO BI-FLEX JOINT SHIELD PO) Take 1 tablet by mouth daily.    . Multiple Vitamin (MULTIVITAMIN WITH MINERALS) TABS tablet Take 1 tablet by mouth daily.    . silver sulfADIAZINE (SILVADENE) 1 % cream Apply 1 application topically daily. 50 g 0  . traZODone (DESYREL) 50 MG tablet Take 0.5-1 tablets (25-50 mg total) by mouth at bedtime as needed for sleep. 90 tablet 2  . trolamine salicylate (ASPERCREME) 10 % cream Apply 1 application topically daily as needed (knee pain).     No current facility-administered medications on file prior to visit.     Allergies  Allergen Reactions  . Fexofenadine Hcl Other (See Comments)    Doesn't agree with patient  . Aricept [Donepezil Hcl] Other (See Comments)   nightmares  . Penicillins Other (See Comments)    Pt does not remember reaction to penicillin ("did not work")  . Tylenol [Acetaminophen] Other (See Comments)    "funny feeling in head."    Past Medical History:  Diagnosis Date  . Anemia   . Arthritis    hands, knees  . Breast cancer, left breast ([place]) 2003   s/p mastectomy, chemo.  Dr. Chancy Milroy oncologist  . Fracture of humerus, proximal, right, closed 07/2010   s/p hemiarthroplasty  . Gastric ulcer with hemorrhage 02/14/2013  . Helicobacter pylori (H. pylori) infection 02/15/2013   eradication confirmed 05/2013 [facility]  . Hypertension   . Multiple duodenal ulcers 02/14/2013  . Osteoporosis     Past Surgical History:  Procedure Laterality Date  . ABDOMINAL HYSTERECTOMY    . BREATH TEK H PYLORI N/A 06/06/2013   Procedure: BREATH TEK H PYLORI;  Surgeon: Gatha Mayer, MD;  Location: [facility];  Service: Endoscopy;  Laterality: N/A;  . ESOPHAGOGASTRODUODENOSCOPY N/A 02/14/2013   Procedure: ESOPHAGOGASTRODUODENOSCOPY (EGD);  Surgeon: Gatha Mayer, MD;  Location: [facility];  Service: Endoscopy;  Laterality: N/A;  . FEMUR IM NAIL  04/13/2012   Procedure: INTRAMEDULLARY (IM) NAIL FEMORAL;  Surgeon: Melina Schools, MD;  Location: [facility];  Service: Orthopedics;  Laterality: Left;  . HIP SURGERY    . KNEE ARTHROSCOPY Bilateral    [facility]lateral knees at [facility]  .  MASTECTOMY Left   . SHOULDER HEMI-ARTHROPLASTY Right 07/2010   Dr. Mardelle Matte    Family History  Problem Relation Age of Onset  . Myasthenia gravis Mother   . Coronary artery disease Father   . Colon cancer Sister     Social History   Socioeconomic History  . Marital status: Widowed    Spouse name: Not on file  . Number of children: 6  . Years of education: Not on file  . Highest education level: Not on file  Occupational History  . Occupation: retired    Fish farm manager: RETIRED  Social Needs  . Financial resource strain: Not on file  . Food insecurity:    Worry: Not on file     Inability: Not on file  . Transportation needs:    Medical: Not on file    Non-medical: Not on file  Tobacco Use  . Smoking status: Never Smoker  . Smokeless tobacco: Never Used  Substance and Sexual Activity  . Alcohol use: No  . Drug use: No  . Sexual activity: Not Currently  Lifestyle  . Physical activity:    Days per week: Not on file    Minutes per session: Not on file  . Stress: Not on file  Relationships  . Social connections:    Talks on phone: Not on file    Gets together: Not on file    Attends religious service: Not on file    Active member of club or organization: Not on file    Attends meetings of clubs or organizations: Not on file    Relationship status: Not on file  . Intimate partner violence:    Fear of current or ex partner: Not on file    Emotionally abused: Not on file    Physically abused: Not on file    Forced sexual activity: Not on file  Other Topics Concern  . Not on file  Social History Narrative   Now has 24/7 in home care   The PMH, [place], Social History, Family History, Medications, and allergies have been reviewed in [facility], and have been updated if relevant.    Review of Systems  Constitutional: Negative.   Genitourinary: Positive for frequency and urgency. Negative for decreased urine volume, difficulty urinating, dyspareunia, dysuria, enuresis, flank pain, genital sores, hematuria, menstrual problem, pelvic pain, vaginal bleeding, vaginal discharge and vaginal pain.  All other systems reviewed and are negative.      Objective:    BP 110/78 (BP Location: Right Arm, Patient Position: Sitting, Cuff Size: Normal)   Pulse 62   Temp 98.5 F (36.9 C) (Oral)   Ht 5\' 3"  (1.6 m)   Wt 138 lb 6.4 oz (62.8 kg)   SpO2 98%   BMI 24.52 kg/m    Physical Exam  Constitutional: She is oriented to person, place, and time. She appears well-developed and well-nourished. No distress.  HENT:  Head: Normocephalic and atraumatic.  Eyes: Conjunctivae are  normal.  Cardiovascular: Normal rate.  Pulmonary/Chest: Effort normal.  Abdominal: Soft. Bowel sounds are normal. She exhibits no distension and no mass. There is no tenderness. There is no rebound and no guarding.  Neurological: She is alert and oriented to person, place, and time. No cranial nerve deficit.  Skin: Skin is warm and dry. She is not diaphoretic.  Psychiatric: She has a normal mood and affect. Her behavior is normal. Judgment and thought content normal.  Nursing note and vitals reviewed.         Assessment &  Plan:   Urinary frequency - Plan: POCT urinalysis dipstick No follow-ups on file.

## 2017-11-29 NOTE — Patient Instructions (Signed)
Great to see you. Start wearing depends at night.  If you get to the point where you want to start a medication like detrol, let me know.

## 2017-11-30 NOTE — Assessment & Plan Note (Signed)
UA neg for UTI- reassurance provided to pt and her daughter. Discussed OAB rx and potentailside effects.  They would like to defer these at this time. She does agree to wear depends to help avoid skin breakdown. Call or return to clinic prn if these symptoms worsen or fail to improve as anticipated. The patient indicates understanding of these issues and agrees with the plan.

## 2018-01-08 DIAGNOSIS — Z6822 Body mass index (BMI) 22.0-22.9, adult: Secondary | ICD-10-CM | POA: Diagnosis not present

## 2018-01-08 DIAGNOSIS — M19042 Primary osteoarthritis, left hand: Secondary | ICD-10-CM | POA: Diagnosis not present

## 2018-01-08 DIAGNOSIS — J309 Allergic rhinitis, unspecified: Secondary | ICD-10-CM | POA: Diagnosis not present

## 2018-01-08 DIAGNOSIS — I10 Essential (primary) hypertension: Secondary | ICD-10-CM | POA: Diagnosis not present

## 2018-01-08 DIAGNOSIS — G4700 Insomnia, unspecified: Secondary | ICD-10-CM | POA: Diagnosis not present

## 2018-01-08 DIAGNOSIS — R413 Other amnesia: Secondary | ICD-10-CM | POA: Diagnosis not present

## 2018-01-08 DIAGNOSIS — M19041 Primary osteoarthritis, right hand: Secondary | ICD-10-CM | POA: Diagnosis not present

## 2018-02-28 DIAGNOSIS — H1851 Endothelial corneal dystrophy: Secondary | ICD-10-CM | POA: Diagnosis not present

## 2018-02-28 DIAGNOSIS — H16223 Keratoconjunctivitis sicca, not specified as Sjogren's, bilateral: Secondary | ICD-10-CM | POA: Diagnosis not present

## 2018-02-28 DIAGNOSIS — H01003 Unspecified blepharitis right eye, unspecified eyelid: Secondary | ICD-10-CM | POA: Diagnosis not present

## 2018-02-28 DIAGNOSIS — H40013 Open angle with borderline findings, low risk, bilateral: Secondary | ICD-10-CM | POA: Diagnosis not present

## 2018-04-07 DIAGNOSIS — I639 Cerebral infarction, unspecified: Secondary | ICD-10-CM | POA: Insufficient documentation

## 2018-04-07 HISTORY — DX: Cerebral infarction, unspecified: I63.9

## 2018-05-01 ENCOUNTER — Inpatient Hospital Stay (HOSPITAL_COMMUNITY)
Admission: EM | Admit: 2018-05-01 | Discharge: 2018-05-07 | DRG: 064 | Disposition: A | Discharge status: Skilled Nursing Facility | Payer: Medicare PPO | Attending: Internal Medicine | Admitting: Internal Medicine

## 2018-05-01 ENCOUNTER — Encounter (HOSPITAL_COMMUNITY): Payer: Self-pay

## 2018-05-01 ENCOUNTER — Emergency Department (HOSPITAL_COMMUNITY): Payer: Medicare PPO

## 2018-05-01 DIAGNOSIS — R531 Weakness: Secondary | ICD-10-CM | POA: Diagnosis present

## 2018-05-01 DIAGNOSIS — R29818 Other symptoms and signs involving the nervous system: Secondary | ICD-10-CM | POA: Diagnosis not present

## 2018-05-01 DIAGNOSIS — E669 Obesity, unspecified: Secondary | ICD-10-CM | POA: Diagnosis present

## 2018-05-01 DIAGNOSIS — I639 Cerebral infarction, unspecified: Secondary | ICD-10-CM | POA: Diagnosis not present

## 2018-05-01 DIAGNOSIS — R29810 Facial weakness: Secondary | ICD-10-CM | POA: Diagnosis present

## 2018-05-01 DIAGNOSIS — N183 Chronic kidney disease, stage 3 unspecified: Secondary | ICD-10-CM

## 2018-05-01 DIAGNOSIS — F0391 Unspecified dementia with behavioral disturbance: Secondary | ICD-10-CM | POA: Diagnosis present

## 2018-05-01 DIAGNOSIS — I6340 Cerebral infarction due to embolism of unspecified cerebral artery: Secondary | ICD-10-CM | POA: Diagnosis present

## 2018-05-01 DIAGNOSIS — M810 Age-related osteoporosis without current pathological fracture: Secondary | ICD-10-CM | POA: Diagnosis present

## 2018-05-01 DIAGNOSIS — I351 Nonrheumatic aortic (valve) insufficiency: Secondary | ICD-10-CM | POA: Diagnosis not present

## 2018-05-01 DIAGNOSIS — Z6839 Body mass index (BMI) 39.0-39.9, adult: Secondary | ICD-10-CM

## 2018-05-01 DIAGNOSIS — Z96611 Presence of right artificial shoulder joint: Secondary | ICD-10-CM | POA: Diagnosis present

## 2018-05-01 DIAGNOSIS — R1310 Dysphagia, unspecified: Secondary | ICD-10-CM

## 2018-05-01 DIAGNOSIS — G8194 Hemiplegia, unspecified affecting left nondominant side: Secondary | ICD-10-CM | POA: Diagnosis present

## 2018-05-01 DIAGNOSIS — G9340 Encephalopathy, unspecified: Secondary | ICD-10-CM

## 2018-05-01 DIAGNOSIS — I4891 Unspecified atrial fibrillation: Secondary | ICD-10-CM | POA: Diagnosis present

## 2018-05-01 DIAGNOSIS — G9341 Metabolic encephalopathy: Secondary | ICD-10-CM | POA: Diagnosis present

## 2018-05-01 DIAGNOSIS — Z9012 Acquired absence of left breast and nipple: Secondary | ICD-10-CM

## 2018-05-01 DIAGNOSIS — R1312 Dysphagia, oropharyngeal phase: Secondary | ICD-10-CM | POA: Diagnosis present

## 2018-05-01 DIAGNOSIS — I480 Paroxysmal atrial fibrillation: Secondary | ICD-10-CM | POA: Diagnosis present

## 2018-05-01 DIAGNOSIS — I129 Hypertensive chronic kidney disease with stage 1 through stage 4 chronic kidney disease, or unspecified chronic kidney disease: Secondary | ICD-10-CM | POA: Diagnosis present

## 2018-05-01 DIAGNOSIS — E785 Hyperlipidemia, unspecified: Secondary | ICD-10-CM | POA: Diagnosis present

## 2018-05-01 DIAGNOSIS — I5189 Other ill-defined heart diseases: Secondary | ICD-10-CM | POA: Diagnosis not present

## 2018-05-01 DIAGNOSIS — R29708 NIHSS score 8: Secondary | ICD-10-CM | POA: Diagnosis present

## 2018-05-01 DIAGNOSIS — F0390 Unspecified dementia without behavioral disturbance: Secondary | ICD-10-CM | POA: Diagnosis present

## 2018-05-01 DIAGNOSIS — Z90710 Acquired absence of both cervix and uterus: Secondary | ICD-10-CM | POA: Diagnosis not present

## 2018-05-01 DIAGNOSIS — F05 Delirium due to known physiological condition: Secondary | ICD-10-CM | POA: Diagnosis present

## 2018-05-01 DIAGNOSIS — Z7401 Bed confinement status: Secondary | ICD-10-CM | POA: Diagnosis not present

## 2018-05-01 DIAGNOSIS — R4781 Slurred speech: Secondary | ICD-10-CM | POA: Diagnosis not present

## 2018-05-01 DIAGNOSIS — R457 State of emotional shock and stress, unspecified: Secondary | ICD-10-CM | POA: Diagnosis not present

## 2018-05-01 DIAGNOSIS — Z853 Personal history of malignant neoplasm of breast: Secondary | ICD-10-CM

## 2018-05-01 DIAGNOSIS — Z9221 Personal history of antineoplastic chemotherapy: Secondary | ICD-10-CM | POA: Diagnosis not present

## 2018-05-01 DIAGNOSIS — I6310 Cerebral infarction due to embolism of unspecified precerebral artery: Secondary | ICD-10-CM

## 2018-05-01 DIAGNOSIS — R400 Somnolence: Secondary | ICD-10-CM | POA: Diagnosis not present

## 2018-05-01 DIAGNOSIS — R0989 Other specified symptoms and signs involving the circulatory and respiratory systems: Secondary | ICD-10-CM | POA: Diagnosis not present

## 2018-05-01 DIAGNOSIS — Z66 Do not resuscitate: Secondary | ICD-10-CM | POA: Diagnosis present

## 2018-05-01 DIAGNOSIS — G459 Transient cerebral ischemic attack, unspecified: Secondary | ICD-10-CM | POA: Diagnosis not present

## 2018-05-01 DIAGNOSIS — R0902 Hypoxemia: Secondary | ICD-10-CM | POA: Diagnosis not present

## 2018-05-01 DIAGNOSIS — I10 Essential (primary) hypertension: Secondary | ICD-10-CM | POA: Diagnosis present

## 2018-05-01 DIAGNOSIS — I6330 Cerebral infarction due to thrombosis of unspecified cerebral artery: Secondary | ICD-10-CM | POA: Diagnosis not present

## 2018-05-01 DIAGNOSIS — R262 Difficulty in walking, not elsewhere classified: Secondary | ICD-10-CM | POA: Diagnosis not present

## 2018-05-01 DIAGNOSIS — M2550 Pain in unspecified joint: Secondary | ICD-10-CM | POA: Diagnosis not present

## 2018-05-01 DIAGNOSIS — I6523 Occlusion and stenosis of bilateral carotid arteries: Secondary | ICD-10-CM | POA: Diagnosis not present

## 2018-05-01 HISTORY — DX: Chronic kidney disease, stage 3 unspecified: N18.30

## 2018-05-01 HISTORY — DX: Chronic kidney disease, stage 3 (moderate): N18.3

## 2018-05-01 LAB — COMPREHENSIVE METABOLIC PANEL
ALK PHOS: 38 U/L (ref 38–126)
ALT: 12 U/L (ref 0–44)
AST: 21 U/L (ref 15–41)
Albumin: 3.8 g/dL (ref 3.5–5.0)
Anion gap: 8 (ref 5–15)
BILIRUBIN TOTAL: 0.7 mg/dL (ref 0.3–1.2)
BUN: 16 mg/dL (ref 8–23)
CALCIUM: 9.3 mg/dL (ref 8.9–10.3)
CO2: 24 mmol/L (ref 22–32)
Chloride: 109 mmol/L (ref 98–111)
Creatinine, Ser: 0.98 mg/dL (ref 0.44–1.00)
GFR, EST AFRICAN AMERICAN: 57 mL/min — AB (ref >60)
GFR, EST NON AFRICAN AMERICAN: 49 mL/min — AB (ref >60)
GLUCOSE: 101 mg/dL — AB (ref 70–99)
POTASSIUM: 4.5 mmol/L (ref 3.5–5.1)
Sodium: 141 mmol/L (ref 135–145)
TOTAL PROTEIN: 6.2 g/dL — AB (ref 6.5–8.1)

## 2018-05-01 LAB — CBC WITH DIFFERENTIAL/PLATELET
Abs Immature Granulocytes: 0 10*3/uL (ref 0.0–0.1)
Basophils Absolute: 0 10*3/uL (ref 0.0–0.1)
Basophils Relative: 0 %
EOS ABS: 0.1 10*3/uL (ref 0.0–0.7)
EOS PCT: 1 %
HCT: 41.6 % (ref 36.0–46.0)
HEMOGLOBIN: 12.9 g/dL (ref 12.0–15.0)
Immature Granulocytes: 0 %
LYMPHS ABS: 2.6 10*3/uL (ref 0.7–4.0)
LYMPHS PCT: 28 %
MCH: 27.3 pg (ref 26.0–34.0)
MCHC: 31 g/dL (ref 30.0–36.0)
MCV: 88.1 fL (ref 78.0–100.0)
MONO ABS: 1.4 10*3/uL — AB (ref 0.1–1.0)
Monocytes Relative: 15 %
Neutro Abs: 5.2 10*3/uL (ref 1.7–7.7)
Neutrophils Relative %: 56 %
Platelets: 230 10*3/uL (ref 150–400)
RBC: 4.72 MIL/uL (ref 3.87–5.11)
RDW: 14.9 % (ref 11.5–15.5)
WBC: 9.3 10*3/uL (ref 4.0–10.5)

## 2018-05-01 LAB — PROTIME-INR
INR: 1.01
PROTHROMBIN TIME: 13.2 s (ref 11.4–15.2)

## 2018-05-01 NOTE — ED Notes (Signed)
Family at bedside. 

## 2018-05-01 NOTE — ED Provider Notes (Signed)
[facility] EMERGENCY DEPARTMENT Provider Note   CSN: [id number] Arrival date & time: 05/01/18  2153     History   Chief Complaint Chief Complaint  Patient presents with  . Altered Mental Status    HPI Katie Silva is a 84 y.o. female.   84 y/o female with hx of HTN, anemia, arthritis presents to the emergency department for evaluation of weakness.  Patient coming from home where she is cared for by her siblings.  Last seen normal at 3:30 AM when waking to use the bathroom; this is a nightly occurrence.  Family noticed the patient being increasingly weak throughout the day.  This is more notable on the left side, though patient describes her weakness as generalized.  She is usually ambulatory with a 4 pronged cane at baseline.  Has had difficulty with ambulation and transitions despite 1 person assist.  Has had a generally good appetite today.  Has complaints of a mild right temporal headache.  Denies vision changes, hearing changes, nausea, vomiting, chest pain, shortness of breath, abdominal pain.  No recent fall, head injury, trauma.  She is not on chronic anticoagulation.     Past Medical History:  Diagnosis Date  . Anemia   . Arthritis    hands, knees  . Breast cancer, left breast ([place]) 2003   s/p mastectomy, chemo.  Dr. Chancy Milroy oncologist  . Fracture of humerus, proximal, right, closed 07/2010   s/p hemiarthroplasty  . Gastric ulcer with hemorrhage 02/14/2013  . Helicobacter pylori (H. pylori) infection 02/15/2013   eradication confirmed 05/2013 [facility]  . Hypertension   . Multiple duodenal ulcers 02/14/2013  . Osteoporosis     Patient Active Problem List   Diagnosis Date Noted  . Urinary frequency 11/29/2017  . Encounter for completion of form with patient 05/31/2017  . Pedal edema 12/31/2015  . Dementia 03/14/2015  . Postoperative anemia 04/14/2012  . SVT (supraventricular tachycardia) ([place]) 04/14/2012  . Closed left hip fracture ([place]) 04/13/2012    . HTN (hypertension) 01/30/2011  . Breast cancer, left breast ([place])   . Osteoporosis   . Fracture of humerus, proximal, right, closed 07/08/2010    Past Surgical History:  Procedure Laterality Date  . ABDOMINAL HYSTERECTOMY    . BREATH TEK H PYLORI N/A 06/06/2013   Procedure: BREATH TEK H PYLORI;  Surgeon: Gatha Mayer, MD;  Location: [facility];  Service: Endoscopy;  Laterality: N/A;  . ESOPHAGOGASTRODUODENOSCOPY N/A 02/14/2013   Procedure: ESOPHAGOGASTRODUODENOSCOPY (EGD);  Surgeon: Gatha Mayer, MD;  Location: [facility];  Service: Endoscopy;  Laterality: N/A;  . FEMUR IM NAIL  04/13/2012   Procedure: INTRAMEDULLARY (IM) NAIL FEMORAL;  Surgeon: Melina Schools, MD;  Location: [facility] ORS;  Service: Orthopedics;  Laterality: Left;  . HIP SURGERY    . KNEE ARTHROSCOPY Bilateral    [facility]  . MASTECTOMY Left   . SHOULDER HEMI-ARTHROPLASTY Right 07/2010   Dr. Mardelle Matte     OB History   None      Home Medications    Prior to Admission medications   Medication Sig Start Date End Date Taking? Authorizing Provider  lisinopril (PRINIVIL,ZESTRIL) 10 MG tablet Take 1 tablet (10 mg total) by mouth daily. 11/16/17  Yes Lucille Passy, MD  Misc Natural Products (OSTEO BI-FLEX JOINT SHIELD PO) Take 1 tablet by mouth daily.   Yes [provider]  Multiple Vitamin (MULTIVITAMIN WITH MINERALS) TABS tablet Take 1 tablet by mouth daily.   Yes [provider]  silver sulfADIAZINE (SILVADENE) 1 % cream Apply 1 application topically daily. Patient not taking: Reported on 05/01/2018 07/01/17   Forrest Moron, MD  traZODone (DESYREL) 50 MG tablet Take 0.5-1 tablets (25-50 mg total) by mouth at bedtime as needed for sleep. Patient not taking: Reported on 05/01/2018 11/16/17   Lucille Passy, MD    Family History Family History  Problem Relation Age of Onset  . Myasthenia gravis Mother   . Coronary artery disease Father   . Colon cancer Sister     Social  History Social History   Tobacco Use  . Smoking status: Never Smoker  . Smokeless tobacco: Never Used  Substance Use Topics  . Alcohol use: No  . Drug use: No     Allergies   Fexofenadine hcl; Aricept [donepezil hcl]; Penicillins; and Tylenol [acetaminophen]   Review of Systems Review of Systems Ten systems reviewed and are negative for acute change, except as noted in the HPI.    Physical Exam Updated Vital Signs BP (!) 192/74   Pulse 76   Temp 98.4 F (36.9 C) (Oral)   Resp (!) 21   SpO2 97%   Physical Exam  Constitutional: She appears well-developed and well-nourished. No distress.  Nontoxic appearing and in NAD  HENT:  Head: Normocephalic and atraumatic.  Mouth/Throat: Oropharynx is clear and moist.  Symmetric rise of the uvula with phonation  Eyes: Pupils are equal, round, and reactive to light. Conjunctivae and EOM are normal. No scleral icterus.  Neck: Normal range of motion.  Cardiovascular: Normal rate, regular rhythm and intact distal pulses.  Pulmonary/Chest: Effort normal. No stridor. No respiratory distress. She has no wheezes.  Respirations even and unlabored  Musculoskeletal: Normal range of motion.  Neurological: She is alert.  GCS 15. Speech is goal oriented. Answer questions appropriately. There appears to be a slight downturning to the left corner of the mouth. Mild pronator drift on the left. Strength in the LUE and LLE appreciated to be 4/5. Normal strength on the right. Reports subjective sensory changes in the LLE compared to right.  Skin: Skin is warm and dry. No rash noted. She is not diaphoretic. No erythema. No pallor.  Psychiatric: She has a normal mood and affect. Her behavior is normal.  Nursing note and vitals reviewed.    ED Treatments / Results  Labs (all labs ordered are listed, but only abnormal results are displayed) Labs Reviewed  CBC WITH DIFFERENTIAL/PLATELET - Abnormal; Notable for the following components:      Result  Value   Monocytes Absolute 1.4 (*)    All other components within normal limits  URINALYSIS, ROUTINE W REFLEX MICROSCOPIC - Abnormal; Notable for the following components:   Leukocytes, UA TRACE (*)    All other components within normal limits  COMPREHENSIVE METABOLIC PANEL - Abnormal; Notable for the following components:   Glucose, Bld 101 (*)    Total Protein 6.2 (*)    GFR calc non Af Amer 49 (*)    GFR calc Af Amer 57 (*)    All other components within normal limits  PROTIME-INR  ETHANOL  APTT  RAPID URINE DRUG SCREEN, HOSP PERFORMED    EKG EKG Interpretation  Date/Time:  Sunday May 01 2018 22:07:32 EDT Ventricular Rate:  66 PR Interval:    QRS Duration: 105 QT Interval:  421 QTC Calculation: 442 R Axis:   -16 Text Interpretation:  Sinus rhythm Left ventricular hypertrophy Anterior infarct, old Artifact in lead(s) I  III aVL V2 V4 Confirmed by Blanchie Dessert [phone]) on 05/01/2018 10:29:49 PM    EKG Interpretation  Date/Time:  Monday May 02 2018 04:34:38 EDT Ventricular Rate:  118 PR Interval:    QRS Duration: 90 QT Interval:  334 QTC Calculation: 468 R Axis:   6 Text Interpretation:  Atrial fibrillation LVH with secondary repolarization abnormality ST depression, consider ischemia, diffuse lds Baseline wander in lead(s) V4 now atrial fibrillation with RVR ST depressions laterally  Confirmed by Ezequiel Essex [phone]) on 05/02/2018 4:39:59 AM       Radiology Ct Angio Head W Or Wo Contrast  Result Date: 05/02/2018 CLINICAL DATA:  84 y/o  F; left-sided weakness and slurred speech. EXAM: CT ANGIOGRAPHY HEAD AND NECK TECHNIQUE: Multidetector CT imaging of the head and neck was performed using the standard protocol during bolus administration of intravenous contrast. Multiplanar CT image reconstructions and MIPs were obtained to evaluate the vascular anatomy. Carotid stenosis measurements (when applicable) are obtained utilizing NASCET criteria, using the distal  internal carotid diameter as the denominator. CONTRAST:  50 cc Isovue 370 COMPARISON:  05/01/2018 CT head FINDINGS: CTA NECK FINDINGS Aortic arch: 4.2 cm ascending aortic aneurysm. Standard branching. Mild calcific atherosclerosis of the aortic arch. Right carotid system: No evidence of dissection, stenosis (50% or greater) or occlusion. Left carotid system: No evidence of dissection, stenosis (50% or greater) or occlusion. Vertebral arteries: Left dominance. No evidence of dissection, stenosis (50% or greater) or occlusion. Skeleton: C3-4 grade 1 retrolisthesis and C4-5 grade 1 anterolisthesis. Prominent bilateral upper cervical facet arthropathy. Discogenic degenerative changes with loss of disc space height at the C3-4 and C5-C7 levels. Other neck: 10 mm nodule within the thyroid left lobe. Upper chest: Negative. Review of the MIP images confirms the above findings CTA HEAD FINDINGS Anterior circulation: Calcific atherosclerosis of the carotid siphons with moderate right and mild left paraclinoid stenosis. Posterior circulation: No significant stenosis, proximal occlusion, aneurysm, or vascular malformation. Venous sinuses: As permitted by contrast timing, patent. Anatomic variants: None significant. Delayed phase: No abnormal intracranial enhancement. Review of the MIP images confirms the above findings IMPRESSION: 1. Patent carotid and vertebral arteries. No dissection, aneurysm, or hemodynamically significant stenosis utilizing NASCET criteria. 2. Patent anterior and posterior intracranial circulation. No large vessel occlusion, aneurysm, or high-grade stenosis. 3. 4.2 cm ascending aortic aneurysm. Recommend annual imaging followup by CTA or MRA. This recommendation follows 2010 ACCF/AHA/AATS/ACR/ASA/SCA/SCAI/SIR/STS/SVM Guidelines for the Diagnosis and Management of Patients with Thoracic Aortic Disease. Circulation. 2010; 121: G836-O294. 4. Calcific atherosclerosis of the carotid siphons with moderate right  and mild left paraclinoid stenosis. Electronically Signed   By: Kristine Garbe M.D.   On: 05/02/2018 03:30   Ct Head Wo Contrast  Result Date: 05/01/2018 CLINICAL DATA:  84 year old female with stroke-like symptoms. EXAM: CT HEAD WITHOUT CONTRAST TECHNIQUE: Contiguous axial images were obtained from the base of the skull through the vertex without intravenous contrast. COMPARISON:  Head CT dated 06/15/2016 FINDINGS: Brain: There is mild age-related atrophy and chronic microvascular ischemic changes. A small area of old infarct and noted involving the anterior horn of the left internal capsule. There is no acute intracranial hemorrhage. No mass effect or midline shift. No extra-axial fluid collection. Vascular: No hyperdense vessel or unexpected calcification. Skull: Normal. Negative for fracture or focal lesion. Sinuses/Orbits: No acute finding. Other: None IMPRESSION: 1. No acute intracranial pathology. 2. Age-related atrophy and chronic microvascular ischemic changes. Electronically Signed   By: Anner Crete M.D.   On: 05/01/2018 23:26  Ct Angio Neck W Or Wo Contrast  Result Date: 05/02/2018 CLINICAL DATA:  84 y/o  F; left-sided weakness and slurred speech. EXAM: CT ANGIOGRAPHY HEAD AND NECK TECHNIQUE: Multidetector CT imaging of the head and neck was performed using the standard protocol during bolus administration of intravenous contrast. Multiplanar CT image reconstructions and MIPs were obtained to evaluate the vascular anatomy. Carotid stenosis measurements (when applicable) are obtained utilizing NASCET criteria, using the distal internal carotid diameter as the denominator. CONTRAST:  50 cc Isovue 370 COMPARISON:  05/01/2018 CT head FINDINGS: CTA NECK FINDINGS Aortic arch: 4.2 cm ascending aortic aneurysm. Standard branching. Mild calcific atherosclerosis of the aortic arch. Right carotid system: No evidence of dissection, stenosis (50% or greater) or occlusion. Left carotid system:  No evidence of dissection, stenosis (50% or greater) or occlusion. Vertebral arteries: Left dominance. No evidence of dissection, stenosis (50% or greater) or occlusion. Skeleton: C3-4 grade 1 retrolisthesis and C4-5 grade 1 anterolisthesis. Prominent bilateral upper cervical facet arthropathy. Discogenic degenerative changes with loss of disc space height at the C3-4 and C5-C7 levels. Other neck: 10 mm nodule within the thyroid left lobe. Upper chest: Negative. Review of the MIP images confirms the above findings CTA HEAD FINDINGS Anterior circulation: Calcific atherosclerosis of the carotid siphons with moderate right and mild left paraclinoid stenosis. Posterior circulation: No significant stenosis, proximal occlusion, aneurysm, or vascular malformation. Venous sinuses: As permitted by contrast timing, patent. Anatomic variants: None significant. Delayed phase: No abnormal intracranial enhancement. Review of the MIP images confirms the above findings IMPRESSION: 1. Patent carotid and vertebral arteries. No dissection, aneurysm, or hemodynamically significant stenosis utilizing NASCET criteria. 2. Patent anterior and posterior intracranial circulation. No large vessel occlusion, aneurysm, or high-grade stenosis. 3. 4.2 cm ascending aortic aneurysm. Recommend annual imaging followup by CTA or MRA. This recommendation follows 2010 ACCF/AHA/AATS/ACR/ASA/SCA/SCAI/SIR/STS/SVM Guidelines for the Diagnosis and Management of Patients with Thoracic Aortic Disease. Circulation. 2010; 121: Q595-G387. 4. Calcific atherosclerosis of the carotid siphons with moderate right and mild left paraclinoid stenosis. Electronically Signed   By: Kristine Garbe M.D.   On: 05/02/2018 03:30   Mr Brain Wo Contrast  Result Date: 05/02/2018 CLINICAL DATA:  84 y/o  F; stroke for follow-up. EXAM: MRI HEAD WITHOUT CONTRAST TECHNIQUE: Multiplanar, multiecho pulse sequences of the brain and surrounding structures were obtained without  intravenous contrast. COMPARISON:  05/01/2018 CT head.  05/02/2018 CTA head. FINDINGS: Brain: Subcentimeter foci of reduced diffusion are present within the left posterior frontal subcortical white matter, right caudate body, right putamen, right caudate head, right hemi pons, and bilateral cerebellar hemispheres compatible with acute/early subacute infarction. No associated hemorrhage or mass effect. Several nonspecific T2 FLAIR hyperintensities in subcortical and periventricular white matter are compatible with moderate chronic microvascular ischemic changes for age. Moderate volume loss of the brain. No extra-axial collection, hydrocephalus, or herniation. Small foci of susceptibility hypointensity are present in a predominantly central distribution within the basal ganglia compatible with hemosiderin deposition of chronic microhemorrhage. The central distribution favors hypertensive etiology. Vascular: Normal flow voids. Skull and upper cervical spine: Normal marrow signal. Sinuses/Orbits: Negative. Other: None. IMPRESSION: Subcentimeter foci of acute/early subacute infarction are present within the left posterior frontal subcortical white matter, right caudate body, right putamen, right caudate head, right hemi pons, and bilateral cerebellar hemispheres. No associated hemorrhage or mass effect. Multiple vascular territories probably represents embolic etiology. Electronically Signed   By: Kristine Garbe M.D.   On: 05/02/2018 04:16    Procedures Procedures (including critical care time)  Medications Ordered in ED Medications  traMADol (ULTRAM) tablet 50 mg (has no administration in time range)  iopamidol (ISOVUE-370) 76 % injection (has no administration in time range)  diltiazem (CARDIZEM) 1 mg/mL load via infusion 10 mg (has no administration in time range)    And  diltiazem (CARDIZEM) 100 mg in dextrose 5% 164mL (1 mg/mL) infusion (has no administration in time range)  iopamidol  (ISOVUE-370) 76 % injection 50 mL (50 mLs Intravenous Contrast Given 05/02/18 0238)     12:50 AM Initial CT negative.  Pending CTA.  Patient does not meet criteria for LVO.  Case discussed between Dr. Leonel Ramsay and Dr. Wyvonnia Dusky.  Neurology to consult.  4:32 AM MRI resulted.  It appears that patient has been showering emboli causing multiple areas of acute/subacute infarct.  She has remained hemodynamically stable.  On repeat assessment, patient noted to be in atrial fibrillation with RVR.  Discussed with daughter that patient may have been going in and out of this rhythm spontaneously without knowledge.  This could account for her multiple areas of infarction.  Will give rate control medication as this is presently ranging from 97-135.  BP goal with SBP >160.  4:34 AM Case discussed with Dr. Leonel Ramsay. Would hold anticoagulation for a few days. Plan for hospitalist admission. Daughter at beside states she has discussed the patient's wishes with her before. No official DNR/DNI in place, but daughter states patient would not desire any aggressive resuscitation efforts should they be indicated this admission.    CRITICAL CARE Performed by: Antonietta Breach   Total critical care time: 45 minutes  Critical care time was exclusive of separately billable procedures and treating other patients.  Critical care was necessary to treat or prevent imminent or life-threatening deterioration.  Critical care was time spent personally by me on the following activities: development of treatment plan with patient and/or surrogate as well as nursing, discussions with consultants, evaluation of patient's response to treatment, examination of patient, obtaining history from patient or surrogate, ordering and performing treatments and interventions, ordering and review of laboratory studies, ordering and review of radiographic studies, pulse oximetry and re-evaluation of patient's condition.   Initial Impression /  Assessment and Plan / ED Course  I have reviewed the triage vital signs and the nursing notes.  Pertinent labs & imaging results that were available during my care of the patient were reviewed by me and considered in my medical decision making (see chart for details).     84 year old female presents the emergency department for left-sided weakness and gait difficulty.  No criteria for large vessel occlusion on arrival.  She did have some slight strength deficit in her left upper and left lower extremities.  Also with slight pronator drift on my assessment.  Work-up today reveals multiple areas of acute/subacute stroke on MRI.  Patient appears to be showering emboli.  She was in normal sinus rhythm when she arrived in the emergency department.  However, on my repeat assessment of the patient she was noted to be in atrial fibrillation with rapid ventricular response.  She has no known history of atrial fibrillation.  Suspect that patient has been going in and out of Afib without knowledge of this, with potential for large thrombus formation.  Neurology to continue to follow. Advises that anticoagulation be held at this time. Dr. Alcario Drought of [facility] to admit.   Final Clinical Impressions(s) / ED Diagnoses   Final diagnoses:  Cerebrovascular accident (CVA) due to embolism of precerebral artery ([place])  ED Discharge Orders    None       Antonietta Breach, PA-C 05/02/18 0450    Ezequiel Essex, MD 05/02/18 [phone]

## 2018-05-01 NOTE — ED Notes (Signed)
Pt placed on purewick 

## 2018-05-01 NOTE — ED Notes (Signed)
Pt to CT via stretcher

## 2018-05-01 NOTE — ED Triage Notes (Signed)
Pt arrives from home c/o left sided weakness.  Last seen normal 0330 AM.  Baseline dementia but does ambulate.  Equal grips.  CBG 108.  Anterior headache.

## 2018-05-02 ENCOUNTER — Emergency Department (HOSPITAL_COMMUNITY): Payer: Medicare PPO

## 2018-05-02 ENCOUNTER — Encounter (HOSPITAL_COMMUNITY): Payer: Self-pay | Admitting: Radiology

## 2018-05-02 ENCOUNTER — Inpatient Hospital Stay (HOSPITAL_COMMUNITY): Payer: Medicare PPO

## 2018-05-02 DIAGNOSIS — M810 Age-related osteoporosis without current pathological fracture: Secondary | ICD-10-CM | POA: Diagnosis present

## 2018-05-02 DIAGNOSIS — R531 Weakness: Secondary | ICD-10-CM

## 2018-05-02 DIAGNOSIS — E669 Obesity, unspecified: Secondary | ICD-10-CM | POA: Diagnosis present

## 2018-05-02 DIAGNOSIS — E785 Hyperlipidemia, unspecified: Secondary | ICD-10-CM | POA: Diagnosis present

## 2018-05-02 DIAGNOSIS — I6310 Cerebral infarction due to embolism of unspecified precerebral artery: Secondary | ICD-10-CM

## 2018-05-02 DIAGNOSIS — I129 Hypertensive chronic kidney disease with stage 1 through stage 4 chronic kidney disease, or unspecified chronic kidney disease: Secondary | ICD-10-CM | POA: Diagnosis present

## 2018-05-02 DIAGNOSIS — R29708 NIHSS score 8: Secondary | ICD-10-CM | POA: Diagnosis present

## 2018-05-02 DIAGNOSIS — Z9221 Personal history of antineoplastic chemotherapy: Secondary | ICD-10-CM | POA: Diagnosis not present

## 2018-05-02 DIAGNOSIS — Z66 Do not resuscitate: Secondary | ICD-10-CM | POA: Diagnosis present

## 2018-05-02 DIAGNOSIS — G8194 Hemiplegia, unspecified affecting left nondominant side: Secondary | ICD-10-CM | POA: Diagnosis present

## 2018-05-02 DIAGNOSIS — F0391 Unspecified dementia with behavioral disturbance: Secondary | ICD-10-CM | POA: Diagnosis present

## 2018-05-02 DIAGNOSIS — I4891 Unspecified atrial fibrillation: Secondary | ICD-10-CM | POA: Diagnosis not present

## 2018-05-02 DIAGNOSIS — R29810 Facial weakness: Secondary | ICD-10-CM | POA: Diagnosis present

## 2018-05-02 DIAGNOSIS — F05 Delirium due to known physiological condition: Secondary | ICD-10-CM | POA: Diagnosis present

## 2018-05-02 DIAGNOSIS — G9341 Metabolic encephalopathy: Secondary | ICD-10-CM | POA: Diagnosis not present

## 2018-05-02 DIAGNOSIS — I480 Paroxysmal atrial fibrillation: Secondary | ICD-10-CM | POA: Diagnosis present

## 2018-05-02 DIAGNOSIS — I6340 Cerebral infarction due to embolism of unspecified cerebral artery: Secondary | ICD-10-CM | POA: Diagnosis present

## 2018-05-02 DIAGNOSIS — I351 Nonrheumatic aortic (valve) insufficiency: Secondary | ICD-10-CM

## 2018-05-02 DIAGNOSIS — F0390 Unspecified dementia without behavioral disturbance: Secondary | ICD-10-CM | POA: Diagnosis not present

## 2018-05-02 DIAGNOSIS — I10 Essential (primary) hypertension: Secondary | ICD-10-CM | POA: Diagnosis not present

## 2018-05-02 DIAGNOSIS — Z6839 Body mass index (BMI) 39.0-39.9, adult: Secondary | ICD-10-CM | POA: Diagnosis not present

## 2018-05-02 DIAGNOSIS — R1312 Dysphagia, oropharyngeal phase: Secondary | ICD-10-CM | POA: Diagnosis present

## 2018-05-02 DIAGNOSIS — Z96611 Presence of right artificial shoulder joint: Secondary | ICD-10-CM | POA: Diagnosis present

## 2018-05-02 DIAGNOSIS — N183 Chronic kidney disease, stage 3 unspecified: Secondary | ICD-10-CM

## 2018-05-02 DIAGNOSIS — I5189 Other ill-defined heart diseases: Secondary | ICD-10-CM

## 2018-05-02 DIAGNOSIS — R400 Somnolence: Secondary | ICD-10-CM | POA: Diagnosis not present

## 2018-05-02 DIAGNOSIS — I639 Cerebral infarction, unspecified: Secondary | ICD-10-CM | POA: Diagnosis not present

## 2018-05-02 DIAGNOSIS — Z9012 Acquired absence of left breast and nipple: Secondary | ICD-10-CM | POA: Diagnosis not present

## 2018-05-02 DIAGNOSIS — Z90710 Acquired absence of both cervix and uterus: Secondary | ICD-10-CM | POA: Diagnosis not present

## 2018-05-02 DIAGNOSIS — Z853 Personal history of malignant neoplasm of breast: Secondary | ICD-10-CM | POA: Diagnosis not present

## 2018-05-02 LAB — ECHOCARDIOGRAM COMPLETE
HEIGHTINCHES: 66 in
WEIGHTICAEL: 3901.26 [oz_av]

## 2018-05-02 LAB — URINALYSIS, ROUTINE W REFLEX MICROSCOPIC
BACTERIA UA: NONE SEEN
Bilirubin Urine: NEGATIVE
GLUCOSE, UA: NEGATIVE mg/dL
Hgb urine dipstick: NEGATIVE
Ketones, ur: NEGATIVE mg/dL
Nitrite: NEGATIVE
PROTEIN: NEGATIVE mg/dL
Specific Gravity, Urine: 1.012 (ref 1.005–1.030)
pH: 6 (ref 5.0–8.0)

## 2018-05-02 LAB — MRSA PCR SCREENING: MRSA by PCR: NEGATIVE

## 2018-05-02 LAB — RAPID URINE DRUG SCREEN, HOSP PERFORMED
Amphetamines: NOT DETECTED
BENZODIAZEPINES: NOT DETECTED
Barbiturates: NOT DETECTED
Cocaine: NOT DETECTED
Opiates: NOT DETECTED
Tetrahydrocannabinol: NOT DETECTED

## 2018-05-02 LAB — ETHANOL

## 2018-05-02 LAB — APTT: APTT: 32 s (ref 24–36)

## 2018-05-02 MED ORDER — ACETAMINOPHEN 650 MG RE SUPP: 650.0000 mg | RECTAL | Status: DC | PRN

## 2018-05-02 MED ORDER — IOPAMIDOL (ISOVUE-370) INJECTION 76%
INTRAVENOUS | Status: AC
  Filled 2018-05-02: qty 50

## 2018-05-02 MED ORDER — ACETAMINOPHEN 160 MG/5ML PO SOLN: 650.0000 mg | ORAL | Status: DC | PRN

## 2018-05-02 MED ORDER — APIXABAN 5 MG PO TABS
5.0000 mg | ORAL_TABLET | Freq: Two times a day (BID) | ORAL | Status: DC
  Administered 2018-05-02 – 2018-05-07 (×9): 5 mg via ORAL
  Filled 2018-05-02 (×9): qty 1

## 2018-05-02 MED ORDER — ACETAMINOPHEN 325 MG PO TABS
650.0000 mg | ORAL_TABLET | ORAL | Status: DC | PRN
  Administered 2018-05-03 – 2018-05-07 (×4): 650 mg via ORAL
  Filled 2018-05-02 (×4): qty 2

## 2018-05-02 MED ORDER — STROKE: EARLY STAGES OF RECOVERY BOOK
Freq: Once | Status: DC
  Filled 2018-05-02: qty 1

## 2018-05-02 MED ORDER — ASPIRIN 325 MG PO TABS
325.0000 mg | ORAL_TABLET | Freq: Every day | ORAL | Status: DC
  Administered 2018-05-02: 325 mg via ORAL
  Filled 2018-05-02: qty 1

## 2018-05-02 MED ORDER — SODIUM CHLORIDE 0.9 % IV SOLN
INTRAVENOUS | Status: DC
  Administered 2018-05-02 – 2018-05-06 (×4): via INTRAVENOUS

## 2018-05-02 MED ORDER — HALOPERIDOL LACTATE 5 MG/ML IJ SOLN
1.0000 mg | Freq: Four times a day (QID) | INTRAMUSCULAR | Status: DC | PRN
  Administered 2018-05-02: 1 mg via INTRAVENOUS
  Filled 2018-05-02: qty 1

## 2018-05-02 MED ORDER — TRAMADOL HCL 50 MG PO TABS
50.0000 mg | ORAL_TABLET | Freq: Once | ORAL | Status: DC
  Filled 2018-05-02 (×3): qty 1

## 2018-05-02 MED ORDER — ASPIRIN 300 MG RE SUPP
300.0000 mg | Freq: Every day | RECTAL | Status: DC
  Filled 2018-05-02: qty 1

## 2018-05-02 MED ORDER — DILTIAZEM HCL-DEXTROSE 100-5 MG/100ML-% IV SOLN (PREMIX): 5.0000 mg/h | INTRAVENOUS | Status: DC

## 2018-05-02 MED ORDER — ENOXAPARIN SODIUM 40 MG/0.4ML ~~LOC~~ SOLN
40.0000 mg | SUBCUTANEOUS | Status: DC
  Administered 2018-05-02: 40 mg via SUBCUTANEOUS
  Filled 2018-05-02 (×2): qty 0.4

## 2018-05-02 MED ORDER — IOPAMIDOL (ISOVUE-370) INJECTION 76%
50.0000 mL | Freq: Once | INTRAVENOUS | Status: AC | PRN
  Administered 2018-05-02: 50 mL via INTRAVENOUS

## 2018-05-02 MED ORDER — DILTIAZEM LOAD VIA INFUSION
10.0000 mg | Freq: Once | INTRAVENOUS | Status: DC
  Filled 2018-05-02: qty 10

## 2018-05-02 NOTE — Progress Notes (Signed)
Inpatient Rehabilitation Admissions Coordinator  Patient admitted today. I will follow up with patient and family to assist with planning dispo options.  Danne Baxter, RN, MSN Rehab Admissions Coordinator [phone] 05/02/2018 2:45 PM

## 2018-05-02 NOTE — Evaluation (Signed)
Clinical/Bedside Swallow Evaluation Patient Details  Name: Katie Silva MRN: [id number] Date of Birth: 05/01/1926  Today's Date: 05/02/2018 Time: SLP Start Time (ACUTE ONLY): 68 SLP Stop Time (ACUTE ONLY): 1054 SLP Time Calculation (min) (ACUTE ONLY): 24 min  Past Medical History:  Past Medical History:  Diagnosis Date  . Anemia   . Arthritis    hands, knees  . Breast cancer, left breast ([place]) 2003   s/p mastectomy, chemo.  Dr. Chancy Milroy oncologist  . Fracture of humerus, proximal, right, closed 07/2010   s/p hemiarthroplasty  . Gastric ulcer with hemorrhage 02/14/2013  . Helicobacter pylori (H. pylori) infection 02/15/2013   eradication confirmed 05/2013 [facility]  . Hypertension   . Multiple duodenal ulcers 02/14/2013  . Osteoporosis    Past Surgical History:  Past Surgical History:  Procedure Laterality Date  . ABDOMINAL HYSTERECTOMY    . BREATH TEK H PYLORI N/A 06/06/2013   Procedure: BREATH TEK H PYLORI;  Surgeon: Gatha Mayer, MD;  Location: [facility];  Service: Endoscopy;  Laterality: N/A;  . ESOPHAGOGASTRODUODENOSCOPY N/A 02/14/2013   Procedure: ESOPHAGOGASTRODUODENOSCOPY (EGD);  Surgeon: Gatha Mayer, MD;  Location: [facility];  Service: Endoscopy;  Laterality: N/A;  . FEMUR IM NAIL  04/13/2012   Procedure: INTRAMEDULLARY (IM) NAIL FEMORAL;  Surgeon: Melina Schools, MD;  Location: [facility] ORS;  Service: Orthopedics;  Laterality: Left;  . HIP SURGERY    . KNEE ARTHROSCOPY Bilateral    BIlateral knees at [facility]  . MASTECTOMY Left   . SHOULDER HEMI-ARTHROPLASTY Right 07/2010   Dr. Mardelle Matte   HPI:  Katie Silva is a 84 y.o. female with medical history significant of dementia, HTN.  Patient presents to the ED with increased confusion, slurred speech, inability to ambulate, L>R sided weakness.  Some intermittent symptoms noticed a week ago. MRI reveals multiple acute embolic sub-cm strokes.   Assessment / Plan / Recommendation Clinical Impression  Pt demonstrates hoarse vocal  quality, different from baseline as well as immediate wet vocal quality and throat clearing after drinking water. Pt would benefit from [facility] for objective assessment of swallowing. In the meantime she may have meds whole in puree.  SLP Visit Diagnosis: Dysphagia, oropharyngeal phase (R13.12)    Aspiration Risk  Mild aspiration risk    Diet Recommendation NPO except meds   Medication Administration: Whole meds with puree Supervision: Staff to assist with self feeding    Other  Recommendations     Follow up Recommendations Skilled Nursing facility      Frequency and Duration            Prognosis        Swallow Study   General HPI: Katie Silva is a 84 y.o. female with medical history significant of dementia, HTN.  Patient presents to the ED with increased confusion, slurred speech, inability to ambulate, L>R sided weakness.  Some intermittent symptoms noticed a week ago. MRI reveals multiple acute embolic sub-cm strokes. Type of Study: Bedside Swallow Evaluation Diet Prior to this Study: NPO Temperature Spikes Noted: No Respiratory Status: Room air History of Recent Intubation: No Behavior/Cognition: Alert;Cooperative;Pleasant mood Oral Cavity Assessment: Within Functional Limits Oral Care Completed by SLP: No Oral Cavity - Dentition: Dentures, top;Dentures, bottom Vision: Functional for self-feeding Self-Feeding Abilities: Able to feed self;Needs assist Patient Positioning: Upright in bed Baseline Vocal Quality: Hoarse Volitional Cough: Weak Volitional Swallow: Able to elicit    Oral/Motor/Sensory Function Overall Oral Motor/Sensory Function: Mild impairment Facial ROM: Reduced left;Suspected CN VII (facial) dysfunction  Facial Symmetry: Abnormal symmetry left;Suspected CN VII (facial) dysfunction Facial Strength: Reduced left;Suspected CN VII (facial) dysfunction Facial Sensation: Within Functional Limits Lingual ROM: Within Functional Limits Lingual Symmetry:  Within Functional Limits Lingual Strength: Within Functional Limits Lingual Sensation: Within Functional Limits Velum: Within Functional Limits Mandible: Within Functional Limits   Ice Chips Ice chips: Not tested   Thin Liquid Thin Liquid: Impaired Presentation: Cup;Straw Pharyngeal  Phase Impairments: Wet Vocal Quality;Throat Clearing - Immediate;Suspected delayed Swallow    Nectar Thick Nectar Thick Liquid: Not tested   Honey Thick Honey Thick Liquid: Not tested   Puree Puree: Within functional limits Presentation: Spoon   Solid     Solid: Within functional limits     [facility], MA CCC-SLP [phone]  Alina Gilkey, Katherene Ponto 05/02/2018,10:57 AM

## 2018-05-02 NOTE — ED Notes (Signed)
Pt in MRI at this time 

## 2018-05-02 NOTE — Progress Notes (Signed)
Pt trying to exit bed almost continually. All avenues of distraction attempted. For pt's safety, soft waist belt must be used per Dr order. Family in agreement.

## 2018-05-02 NOTE — Progress Notes (Signed)
Patient seen and examined this morning, admitted overnight by Dr. Alcario Drought.  H&P reviewed, agree with the assessment and plan.  In brief, 84 year old female with history of dementia, hypertension who came to the hospital with increased confusion, slurred speech and left-sided weakness.  MRI on admission showed multiple strokes likely embolic in nature.  Neurology was consulted.   Acute CVA -Embolic strokes, neuro stroke to follow.  Paroxysmal A. fib -Start anticoagulation when okay with neuro  Hypertension -Allow permissive, hold lisinopril  Dementia -Slightly worse in the setting of stroke   Katie Colantuono M. Cruzita Lederer, MD [name] [phone]  If 7PM-7AM, please contact night-coverage [email or web address] Password [email or web address]

## 2018-05-02 NOTE — Progress Notes (Addendum)
STROKE TEAM PROGRESS NOTE   INTERVAL HISTORY Her son is at the bedside.  Her 4 children provide care for her.  She lives in her own home and they rotate care for her.  She goes to the adult enrichment center during the day, they take her home feed her dinner put her to bed and spend the night with her.  For now, they plan to resume that at time of discharge if she is able.  If we feel she is unable to get back to her baseline, they may prefer SNF.  The children are going to discuss their options.  They understand rehab will be to see them and also discuss again.  The son states she has had limited mobility this past week due to left leg pain.  She fell several years ago and injured her left knee.  There was no intervention available.  Since then she has walked with a cane.  She resists using a walker.  He does not feel the left leg pain is new.  Neuro wise, she continues to improve.  She is able to take meds in pure.  She is going for modified barium swallow this afternoon.  Vitals:   05/02/18 0639 05/02/18 0755 05/02/18 0805 05/02/18 0900  BP: 102/61 97/66 121/74   Pulse: 80 68 77   Resp: 20 (!) 21 (!) 23   Temp: 97.6 F (36.4 C)   (!) 97.4 F (36.3 C)  TempSrc: Oral     SpO2: 93% 97% 95%   Weight: 110.6 kg     Height: _0  (1.676 m)       CBC:  Recent Labs  Lab 05/01/18 2206  WBC 9.3  NEUTROABS 5.2  HGB 12.9  HCT 41.6  MCV 88.1  PLT 132    Basic Metabolic Panel:  Recent Labs  Lab 05/01/18 2206  NA 141  K 4.5  CL 109  CO2 24  GLUCOSE 101*  BUN 16  CREATININE 0.98  CALCIUM 9.3   Lipid Panel:     Component Value Date/Time   CHOL 156 12/03/2016 1205   TRIG 61.0 12/03/2016 1205   HDL 55.50 12/03/2016 1205   CHOLHDL 3 12/03/2016 1205   VLDL 12.2 12/03/2016 1205   LDLCALC 88 12/03/2016 1205   HgbA1c: No results found for: HGBA1C Urine Drug Screen:     Component Value Date/Time   LABOPIA NONE DETECTED 05/01/2018 2345   COCAINSCRNUR NONE DETECTED 05/01/2018  2345   LABBENZ NONE DETECTED 05/01/2018 2345   AMPHETMU NONE DETECTED 05/01/2018 2345   THCU NONE DETECTED 05/01/2018 2345   LABBARB NONE DETECTED 05/01/2018 2345    Alcohol Level     Component Value Date/Time   ETH <10 05/02/2018 0042    IMAGING Ct Angio Head W Or Wo Contrast  Result Date: 05/02/2018 CLINICAL DATA:  84 y/o  F; left-sided weakness and slurred speech. EXAM: CT ANGIOGRAPHY HEAD AND NECK TECHNIQUE: Multidetector CT imaging of the head and neck was performed using the standard protocol during bolus administration of intravenous contrast. Multiplanar CT image reconstructions and MIPs were obtained to evaluate the vascular anatomy. Carotid stenosis measurements (when applicable) are obtained utilizing NASCET criteria, using the distal internal carotid diameter as the denominator. CONTRAST:  50 cc Isovue 370 COMPARISON:  05/01/2018 CT head FINDINGS: CTA NECK FINDINGS Aortic arch: 4.2 cm ascending aortic aneurysm. Standard branching. Mild calcific atherosclerosis of the aortic arch. Right carotid system: No evidence of dissection, stenosis (50% or greater) or occlusion. Left  carotid system: No evidence of dissection, stenosis (50% or greater) or occlusion. Vertebral arteries: Left dominance. No evidence of dissection, stenosis (50% or greater) or occlusion. Skeleton: C3-4 grade 1 retrolisthesis and C4-5 grade 1 anterolisthesis. Prominent bilateral upper cervical facet arthropathy. Discogenic degenerative changes with loss of disc space height at the C3-4 and C5-C7 levels. Other neck: 10 mm nodule within the thyroid left lobe. Upper chest: Negative. Review of the MIP images confirms the above findings CTA HEAD FINDINGS Anterior circulation: Calcific atherosclerosis of the carotid siphons with moderate right and mild left paraclinoid stenosis. Posterior circulation: No significant stenosis, proximal occlusion, aneurysm, or vascular malformation. Venous sinuses: As permitted by contrast timing,  patent. Anatomic variants: None significant. Delayed phase: No abnormal intracranial enhancement. Review of the MIP images confirms the above findings IMPRESSION: 1. Patent carotid and vertebral arteries. No dissection, aneurysm, or hemodynamically significant stenosis utilizing NASCET criteria. 2. Patent anterior and posterior intracranial circulation. No large vessel occlusion, aneurysm, or high-grade stenosis. 3. 4.2 cm ascending aortic aneurysm. Recommend annual imaging followup by CTA or MRA. This recommendation follows 2010 ACCF/AHA/AATS/ACR/ASA/SCA/SCAI/SIR/STS/SVM Guidelines for the Diagnosis and Management of Patients with Thoracic Aortic Disease. Circulation. 2010; 121: G254-Y706. 4. Calcific atherosclerosis of the carotid siphons with moderate right and mild left paraclinoid stenosis. Electronically Signed   By: Kristine Garbe M.D.   On: 05/02/2018 03:30   Ct Head Wo Contrast  Result Date: 05/01/2018 CLINICAL DATA:  84 year old female with stroke-like symptoms. EXAM: CT HEAD WITHOUT CONTRAST TECHNIQUE: Contiguous axial images were obtained from the base of the skull through the vertex without intravenous contrast. COMPARISON:  Head CT dated 06/15/2016 FINDINGS: Brain: There is mild age-related atrophy and chronic microvascular ischemic changes. A small area of old infarct and noted involving the anterior horn of the left internal capsule. There is no acute intracranial hemorrhage. No mass effect or midline shift. No extra-axial fluid collection. Vascular: No hyperdense vessel or unexpected calcification. Skull: Normal. Negative for fracture or focal lesion. Sinuses/Orbits: No acute finding. Other: None IMPRESSION: 1. No acute intracranial pathology. 2. Age-related atrophy and chronic microvascular ischemic changes. Electronically Signed   By: Anner Crete M.D.   On: 05/01/2018 23:26   Ct Angio Neck W Or Wo Contrast  Result Date: 05/02/2018 CLINICAL DATA:  84 y/o  F; left-sided  weakness and slurred speech. EXAM: CT ANGIOGRAPHY HEAD AND NECK TECHNIQUE: Multidetector CT imaging of the head and neck was performed using the standard protocol during bolus administration of intravenous contrast. Multiplanar CT image reconstructions and MIPs were obtained to evaluate the vascular anatomy. Carotid stenosis measurements (when applicable) are obtained utilizing NASCET criteria, using the distal internal carotid diameter as the denominator. CONTRAST:  50 cc Isovue 370 COMPARISON:  05/01/2018 CT head FINDINGS: CTA NECK FINDINGS Aortic arch: 4.2 cm ascending aortic aneurysm. Standard branching. Mild calcific atherosclerosis of the aortic arch. Right carotid system: No evidence of dissection, stenosis (50% or greater) or occlusion. Left carotid system: No evidence of dissection, stenosis (50% or greater) or occlusion. Vertebral arteries: Left dominance. No evidence of dissection, stenosis (50% or greater) or occlusion. Skeleton: C3-4 grade 1 retrolisthesis and C4-5 grade 1 anterolisthesis. Prominent bilateral upper cervical facet arthropathy. Discogenic degenerative changes with loss of disc space height at the C3-4 and C5-C7 levels. Other neck: 10 mm nodule within the thyroid left lobe. Upper chest: Negative. Review of the MIP images confirms the above findings CTA HEAD FINDINGS Anterior circulation: Calcific atherosclerosis of the carotid siphons with moderate right and mild left  paraclinoid stenosis. Posterior circulation: No significant stenosis, proximal occlusion, aneurysm, or vascular malformation. Venous sinuses: As permitted by contrast timing, patent. Anatomic variants: None significant. Delayed phase: No abnormal intracranial enhancement. Review of the MIP images confirms the above findings IMPRESSION: 1. Patent carotid and vertebral arteries. No dissection, aneurysm, or hemodynamically significant stenosis utilizing NASCET criteria. 2. Patent anterior and posterior intracranial circulation.  No large vessel occlusion, aneurysm, or high-grade stenosis. 3. 4.2 cm ascending aortic aneurysm. Recommend annual imaging followup by CTA or MRA. This recommendation follows 2010 ACCF/AHA/AATS/ACR/ASA/SCA/SCAI/SIR/STS/SVM Guidelines for the Diagnosis and Management of Patients with Thoracic Aortic Disease. Circulation. 2010; 121: V253-G644. 4. Calcific atherosclerosis of the carotid siphons with moderate right and mild left paraclinoid stenosis. Electronically Signed   By: Kristine Garbe M.D.   On: 05/02/2018 03:30   Mr Brain Wo Contrast  Result Date: 05/02/2018 CLINICAL DATA:  84 y/o  F; stroke for follow-up. EXAM: MRI HEAD WITHOUT CONTRAST TECHNIQUE: Multiplanar, multiecho pulse sequences of the brain and surrounding structures were obtained without intravenous contrast. COMPARISON:  05/01/2018 CT head.  05/02/2018 CTA head. FINDINGS: Brain: Subcentimeter foci of reduced diffusion are present within the left posterior frontal subcortical white matter, right caudate body, right putamen, right caudate head, right hemi pons, and bilateral cerebellar hemispheres compatible with acute/early subacute infarction. No associated hemorrhage or mass effect. Several nonspecific T2 FLAIR hyperintensities in subcortical and periventricular white matter are compatible with moderate chronic microvascular ischemic changes for age. Moderate volume loss of the brain. No extra-axial collection, hydrocephalus, or herniation. Small foci of susceptibility hypointensity are present in a predominantly central distribution within the basal ganglia compatible with hemosiderin deposition of chronic microhemorrhage. The central distribution favors hypertensive etiology. Vascular: Normal flow voids. Skull and upper cervical spine: Normal marrow signal. Sinuses/Orbits: Negative. Other: None. IMPRESSION: Subcentimeter foci of acute/early subacute infarction are present within the left posterior frontal subcortical white matter,  right caudate body, right putamen, right caudate head, right hemi pons, and bilateral cerebellar hemispheres. No associated hemorrhage or mass effect. Multiple vascular territories probably represents embolic etiology. Electronically Signed   By: Kristine Garbe M.D.   On: 05/02/2018 04:16   2D Echocardiogram  - Left ventricle: The cavity size was normal. Wall thickness was increased in a pattern of moderate LVH. Systolic function was normal. The estimated ejection fraction was in the range of 60% to 65%. Wall motion was normal; there were no regional wall motion abnormalities. Doppler parameters are consistent with abnormal left ventricular relaxation (grade 1 diastolic dysfunction). The E/e&' ratio is >15, suggesting elevated LV filling pressure. - Aortic valve: Sclerosis without stenosis. There was moderate regurgitation. - Mitral valve: Calcified annulus. Moderate thickening with mild end-systolic prolapse. Trivial regurgitation. - Left atrium: Moderately dilated. - Tricuspid valve: There was trivial regurgitation. - Pulmonary arteries: PA peak pressure: 33 mm Hg (S). - Inferior vena cava: The vessel was dilated. The respirophasic diameter changes were blunted (< 50%), consistent with elevated central venous pressure. Impressions:   LVEF 60-65%, moderate LVH, normal wall motion, grade 1 DD, elevated LV filling pressure, aortic sclerosis with moderate AI, heavy MAC with end-systolic bileaflet prolapse and trivial MR, moderate LAE, trivial TR, RVSP 33 mmHg, dilated IVC.  PHYSICAL EXAM Constitutional: Appears well-developed and well-nourished.  Psych: Affect appropriate to situation Eyes: No scleral injection HENT: No OP obstrucion Head: Normocephalic.  Cardiovascular: Normal rate and regular rhythm.  Respiratory: Effort normal, non-labored breathing Skin: WDI  Neuro: Mental Status: Patient is awake, alert, oriented to person, hospital, not  to year or situation. She is able to name  objects and repeat. She is conversant. No neglect. Poor short term memory Cranial Nerves: II: Visual Fields are full. Pupils are equal, round, and reactive to light. Can count fingers all fields. III,IV, VI: EOMI without ptosis or diploplia.  V: Facial sensation is symmetric to temperature VII: mild L Facial weakness. Mild dysarthria VIII: hearing is intact to voice X: Uvula elevates symmetrically XI: Shoulder shrug is symmetric. XII: tongue is midline without atrophy or fasciculations.  Motor: Tone is normal. Bulk is normal.  she has a left am drift with decreased FMM and R orbiting her L. 4/5 weakness of her left lower extremity proximmarlly, limited by pain and better distally. Sensory: Sensation is symmetric to light touch and temperature in the arms and legs. Cerebellar: She has mild decreased finger-nose-finger on the L   ASSESSMENT/PLAN Ms. ELLAR HAKALA is a 84 y.o. female with history of dementia, HTN, gastric & duodenal ulcer s/ hmg, breast cancer presenting with L HP, confusion, slurred speech and L hip pain.   Stroke:  bilateral cerebellar, R pontine, R BG, R caudate head, R CR, L MCA/ACA watershed infarcts, all embolic secondary to newly dx AFib  CT head No acute stroke. Small vessel disease. Atrophy.   CTA head & neck 4.2 ascending aortic aneurysm. atherosclerosis   MRI  subcentimeter infarct L post frontal, R caudate, R putamen, R pons, B cerebellar infarcts  2D Echo  EF 60-65%. No source of embolus.   LDL pending   HgbA1c pending   Lovenox 40 mg sq daily for VTE prophylaxis  NPO - for MBSS today. [place] for meds in puree per SLP  No antithrombotic prior to admission, now on aspirin 300 mg suppository daily. Given small size of stroke, ok to start [facility]. Will d/c aspirin and start on eliquis today.  Therapy recommendations:  CIR. Consult in place  Disposition:  Pending (see interval hx above)   Ok for transfer to the floor from stroke standpoint  Atrial  Fibrillation, new onset  Home anticoagulation:  none   CHA2DS2-VASc Score = at least 6, ?2 oral anticoagulation recommended  Age in Years:  ?65   +2    Sex:  Female   Female   +1    Hypertension History:  yes   +1     Diabetes Mellitus:  0  Congestive Heart Failure History:  0  Vascular Disease History:  0     Stroke/TIA/Thromboembolism History:  yes   +2 . Per Dr. Erlinda Hong, ok to start [facility] at any time, size of strokes are small . Recommend start Eliquis (apixaban) daily    Aortic Aneurysm  Seen on CTA neck  Annual follow up recommended  Hypertension  Stable . Permissive hypertension (OK if < 220/120) but gradually normalize in 5-7 days . Discontinued order to keep BP > 160, no indication from stroke standpoint . Long-term BP goal normotensive  Possible Hyperlipidemia  Home meds:  No statin  LDL pending, goal < 70  Other Stroke Risk Factors  Advanced age  Obesity, Body mass index is 39.35 kg/m., recommend weight loss, diet and exercise as appropriate   UDS / ETOH level negative   Other Active Problems  Baseline dementia  Hx breast ca  Hx gastric/duodenal ulcers w/ hmg  Osteoporosis   Hospital day # 0  Burnetta Sabin, MSN, APRN, ANVP-BC, AGPCNP-BC Advanced Practice Stroke Nurse [place] for Schedule & Pager information 05/02/2018 9:45  AM   ATTENDING NOTE: I reviewed above note and agree with the assessment and plan. I have made any additions or clarifications directly to the above note. Pt was seen and examined.   84 year old female with history of breast cancer status post surgery and chemo, gastric ulcer 2014, hypertension dementia admitted for left-sided weakness, confusion and slurred speech.  CT no acute abnormality.  CT head and neck showed bilateral siphon atherosclerosis.  MRI showed bilateral cerebellum, right pontine, right basal ganglia, right caudate head, right CR and left MCA/ACA punctate and small infarcts.  EKG on  presentation showed paroxysmal A. Fib.  EF 60 to 65%.  LDL and A1c pending.   Patient stroke embolic pattern, likely due to new diagnosed A. Fib.  Did not pass swallow evaluation, currently on n.p.o. except meds.  Given small size of her strokes, okay to start anticoagulation with Eliquis.  Continue speech follow-up.  Rosalin Hawking, MD PhD Stroke Neurology 05/02/2018 5:51 PM     To contact Stroke Continuity provider, please refer to [email or web address] After hours, contact General Neurology

## 2018-05-02 NOTE — Evaluation (Signed)
Physical Therapy Evaluation Patient Details Name: Katie Silva MRN: [id number] DOB: [birth year] Today's Date: 05/02/2018   History of Present Illness  Patient is a 84 y/o female who presents with left sided weakness, gait instability and slurred speech. Brain MRI- multiple acute embolic infarcts. Found to have new onset A-fib in ED. PMH includes dementia, RA, HTN, breast ca.   Clinical Impression  Patient presents with left sided weakness, slurred speech, dizziness, impaired balance and impaired mobility s/p above. Tolerated sitting EOB and partial standing with assist of 1, will need assist of 2 for OOB, but limited due to dizziness, weakness and posterior lean. Eager to work with PT. Pt has 24/7 supervision/assist from 4 daughters PTA and ambulating with SPC. Some left inattention noted today. Would benefit from intensive therapies to maximize independence and mobility prior to return home. Will follow acutely.  Per BP 97/66, post BP 121/74 with dizziness.    Follow Up Recommendations CIR    Equipment Recommendations  None recommended by PT    Recommendations for Other Services Rehab consult     Precautions / Restrictions Precautions Precautions: Fall Precaution Comments: SBP >160 Restrictions Weight Bearing Restrictions: No      Mobility  Bed Mobility Overal bed mobility: Needs Assistance Bed Mobility: Supine to Sit;Sit to Supine     Supine to sit: Mod assist;HOB elevated Sit to supine: Mod assist;HOB elevated   General bed mobility comments: Increased cues to get to EOB. Assist with LEs and to scoot bottom. Pt with good initiation. Reluctant to use LUE.  Transfers Overall transfer level: Needs assistance Equipment used: Rolling walker (2 wheeled) Transfers: Sit to/from Stand Sit to Stand: Mod assist         General transfer comment: Able to get to half standing but difficulty getting upright due to posterior lean and decreased left knee flexion. + dizziness as  well.  Ambulation/Gait             General Gait Details: Unable this session.  Stairs            Wheelchair Mobility    Modified Rankin (Stroke Patients Only) Modified Rankin (Stroke Patients Only) Pre-Morbid Rankin Score: Moderate disability Modified Rankin: Moderately severe disability     Balance Overall balance assessment: Needs assistance;History of Falls Sitting-balance support: Feet supported;Single extremity supported Sitting balance-Leahy Scale: Poor Sitting balance - Comments: Posterior bias sitting EOB but able to initiate forward lean but fatigued.  Postural control: Posterior lean     Standing balance comment: Unable to get fully upright                             Pertinent Vitals/Pain Pain Assessment: Faces Faces Pain Scale: No hurt    Home Living Family/patient expects to be discharged to:: Private residence Living Arrangements: Alone Available Help at Discharge: Family;Available 24 hours/day Type of Home: House Home Access: Stairs to enter;Ramped entrance Entrance Stairs-Rails: Right Entrance Stairs-Number of Steps: 5 Home Layout: One level Home Equipment: Walker - 2 wheels;Cane - single point;Bedside commode;Shower seat      Prior Function Level of Independence: Needs assistance   Gait / Transfers Assistance Needed: Uses SPC for ambulation. No falls in last year per daughter.   ADL's / Homemaking Assistance Needed: Daughters assist with bathing.   Comments: Goes to daycare daily.      Hand Dominance   Dominant Hand: Left    Extremity/Trunk Assessment   Upper Extremity Assessment Upper  Extremity Assessment: Defer to OT evaluation;LUE deficits/detail LUE Deficits / Details: Weak grip; able to lift against gravity with assist and hold but limited to 60 degrees elevation. Grossly ~3/5 elbow flexion/ext. LUE Sensation: decreased light touch LUE Coordination: decreased fine motor;decreased gross motor    Lower  Extremity Assessment Lower Extremity Assessment: LLE deficits/detail LLE Deficits / Details: Grossly ~2+/5 throughout. Limited knee flexion most likely due to arthritis as well.  LLE Sensation: decreased light touch    Cervical / Trunk Assessment Cervical / Trunk Assessment: Kyphotic  Communication   Communication: (slurred)  Cognition Arousal/Alertness: Awake/alert Behavior During Therapy: WFL for tasks assessed/performed Overall Cognitive Status: Impaired/Different from baseline Area of Impairment: Orientation;Memory;Following commands;Awareness;Problem solving                 Orientation Level: Disoriented to;Situation;Time   Memory: Decreased short-term memory Following Commands: Follows one step commands with increased time   Awareness: Intellectual Problem Solving: Requires verbal cues General Comments: Hx of dementia. Not able to state year of birth but able to state month/date. Some left inattention noted.       General Comments General comments (skin integrity, edema, etc.): Daughter present during session. BP 97/66 pre and 121/74 post with dizziness.    Exercises     Assessment/Plan    PT Assessment Patient needs continued PT services  PT Problem List Decreased strength;Decreased mobility;Impaired tone;Decreased range of motion;Decreased coordination;Decreased activity tolerance;Decreased cognition;Cardiopulmonary status limiting activity;Impaired sensation;Decreased balance       PT Treatment Interventions Functional mobility training;Balance training;Patient/family education;Gait training;DME instruction;Neuromuscular re-education;Therapeutic activities;Therapeutic exercise;Cognitive remediation;Wheelchair mobility training    PT Goals (Current goals can be found in the Care Plan section)  Acute Rehab PT Goals Patient Stated Goal: to get better PT Goal Formulation: With patient/family Time For Goal Achievement: 05/16/18 Potential to Achieve Goals:  Good    Frequency Min 4X/week   Barriers to discharge        Co-evaluation               AM-PAC PT "6 Clicks" Daily Activity  Outcome Measure Difficulty turning over in bed (including adjusting bedclothes, sheets and blankets)?: Unable Difficulty moving from lying on back to sitting on the side of the bed? : Unable Difficulty sitting down on and standing up from a chair with arms (e.g., wheelchair, bedside commode, etc,.)?: Unable Help needed moving to and from a bed to chair (including a wheelchair)?: A Lot Help needed walking in hospital room?: Total Help needed climbing 3-5 steps with a railing? : Total 6 Click Score: 7    End of Session Equipment Utilized During Treatment: Gait belt Activity Tolerance: Treatment limited secondary to medical complications (Comment);Patient limited by fatigue(dizziness and low BP) Patient left: in bed;with call bell/phone within reach;with bed alarm set;with family/visitor present;with SCD's reapplied Nurse Communication: Mobility status PT Visit Diagnosis: Hemiplegia and hemiparesis;Difficulty in walking, not elsewhere classified (R26.2);Muscle weakness (generalized) (M62.81) Hemiplegia - Right/Left: Left Hemiplegia - dominant/non-dominant: Dominant Hemiplegia - caused by: Cerebral infarction    Time: 9935-7017 PT Time Calculation (min) (ACUTE ONLY): 30 min   Charges:   PT Evaluation $PT Eval Moderate Complexity: 1 Mod PT Treatments $Therapeutic Activity: 8-22 mins        [place], [place], [place] [phone]    Katie Silva 05/02/2018, 9:28 AM

## 2018-05-02 NOTE — Evaluation (Signed)
Speech Language Pathology Evaluation Patient Details Name: Katie Silva MRN: [id number] DOB: 1925/12/24 Today's Date: 05/02/2018 Time: 1937-9024 SLP Time Calculation (min) (ACUTE ONLY): 24 min  Problem List:  Patient Active Problem List   Diagnosis Date Noted  . New onset atrial fibrillation ([place]) 05/02/2018  . Acute cardioembolic stroke ([place]) 09/73/5329  . Urinary frequency 11/29/2017  . Encounter for completion of form with patient 05/31/2017  . Pedal edema 12/31/2015  . Dementia 03/14/2015  . Postoperative anemia 04/14/2012  . SVT (supraventricular tachycardia) ([place]) 04/14/2012  . Closed left hip fracture ([place]) 04/13/2012  . HTN (hypertension) 01/30/2011  . Breast cancer, left breast ([place])   . Osteoporosis   . Fracture of humerus, proximal, right, closed 07/08/2010   Past Medical History:  Past Medical History:  Diagnosis Date  . Anemia   . Arthritis    hands, knees  . Breast cancer, left breast ([place]) 2003   s/p mastectomy, chemo.  Dr. Chancy Milroy oncologist  . Fracture of humerus, proximal, right, closed 07/2010   s/p hemiarthroplasty  . Gastric ulcer with hemorrhage 02/14/2013  . Helicobacter pylori (H. pylori) infection 02/15/2013   eradication confirmed 05/2013 [facility]  . Hypertension   . Multiple duodenal ulcers 02/14/2013  . Osteoporosis    Past Surgical History:  Past Surgical History:  Procedure Laterality Date  . ABDOMINAL HYSTERECTOMY    . BREATH TEK H PYLORI N/A 06/06/2013   Procedure: BREATH TEK H PYLORI;  Surgeon: Gatha Mayer, MD;  Location: [facility] ENDOSCOPY;  Service: Endoscopy;  Laterality: N/A;  . ESOPHAGOGASTRODUODENOSCOPY N/A 02/14/2013   Procedure: ESOPHAGOGASTRODUODENOSCOPY (EGD);  Surgeon: Gatha Mayer, MD;  Location: [facility];  Service: Endoscopy;  Laterality: N/A;  . FEMUR IM NAIL  04/13/2012   Procedure: INTRAMEDULLARY (IM) NAIL FEMORAL;  Surgeon: Melina Schools, MD;  Location: [facility] ORS;  Service: Orthopedics;  Laterality: Left;  . HIP SURGERY    .  KNEE ARTHROSCOPY Bilateral    [facility]lateral [facility] at [facility]  . MASTECTOMY Left   . SHOULDER HEMI-ARTHROPLASTY Right 07/2010   Dr. Mardelle Matte   HPI:  Katie Silva is a 84 y.o. female with medical history significant of dementia, HTN.  Patient presents to the ED with increased confusion, slurred speech, inability to ambulate, L>R sided weakness.  Some intermittent symptoms noticed a week ago. MRI reveals multiple acute embolic sub-cm strokes.   Assessment / Plan / Recommendation Clinical Impression  Pt demonstrates baseline dementia with less than one minute sustained short term memory. Language and attention are intact, but there is decreased awareness of deficits and a mild left neglect. Pt only responsible for basic toliting at baseline, family otherwise prepares all meals, assists with bathing and dressing. Pt will suffer from new safety risk given poor awarness of deficits, inability to plan multistep functional tasks in setting of physical impairment. Will f/u to address mild dysarthria and cognitive compensatory strategies with family.     SLP Assessment  SLP Recommendation/Assessment: Patient needs continued Speech Lanaguage Pathology Services SLP Visit Diagnosis: Frontal lobe and executive function deficit Frontal lobe and executive function deficit following: Cerebral infarction    Follow Up Recommendations  Skilled Nursing facility    Frequency and Duration min 2x/week  1 week      SLP Evaluation Cognition  Overall Cognitive Status: Impaired/Different from baseline Arousal/Alertness: Awake/alert Orientation Level: Oriented to person;Disoriented to place;Disoriented to time;Disoriented to situation Attention: Focused;Sustained Focused Attention: Appears intact Sustained Attention: Appears intact Memory: Impaired Memory Impairment: Decreased short term memory Decreased Short  Term Memory: Verbal basic;Functional basic Awareness: Impaired Awareness Impairment: Intellectual  impairment Problem Solving: Impaired Problem Solving Impairment: Verbal basic;Functional basic Safety/Judgment: Impaired       Comprehension  Auditory Comprehension Overall Auditory Comprehension: Appears within functional limits for tasks assessed    Expression Verbal Expression Overall Verbal Expression: Appears within functional limits for tasks assessed Written Expression Dominant Hand: Left   Oral / Motor  Oral Motor/Sensory Function Overall Oral Motor/Sensory Function: Mild impairment Facial ROM: Reduced left;Suspected CN VII (facial) dysfunction Facial Symmetry: Abnormal symmetry left;Suspected CN VII (facial) dysfunction Facial Strength: Reduced left;Suspected CN VII (facial) dysfunction Facial Sensation: Within Functional Limits Lingual ROM: Within Functional Limits Lingual Symmetry: Within Functional Limits Lingual Strength: Within Functional Limits Lingual Sensation: Within Functional Limits Velum: Within Functional Limits Mandible: Within Functional Limits Motor Speech Overall Motor Speech: Impaired Resonance: Within functional limits Articulation: Impaired Intelligibility: Intelligible   GO                   Herbie [place], MA CCC-SLP [phone]  Lynann Beaver 05/02/2018, 11:04 AM

## 2018-05-02 NOTE — Progress Notes (Addendum)
Modified Barium Swallow Progress Note  Patient Details  Name: Katie Silva MRN: [id number] Date of Birth: 06-18-1926  Today's Date: 05/02/2018  Modified Barium Swallow completed.  Full report located under Chart Review in the Imaging Section.  Brief recommendations include the following:  Clinical Impression  Pt demonstrates a mild oropharyngeal dysphagia with no overt weakness. Prominent cricopharyngeus observed with trace backflow to pyriforms in addition to mild entrapment of solids and liquids in the valleculae and pyriforms. This results in trace silent frank penetration which clears with spontanous and cued throat clearing. Suspect this is a baseline age related/primary cervical esophageal impairment. Recommend pt resumed a regular diet and thin liquids with additional aspiration precautions including cues to clear throat intermittently and to participate in consistent oral care.    Swallow Evaluation Recommendations       SLP Diet Recommendations: Regular solids;Thin liquid   Liquid Administration via: Cup;Straw   Medication Administration: Whole meds with puree   Supervision: Patient able to self feed   Compensations: Slow rate;Small sips/bites;Clear throat intermittently   Postural Changes: Seated upright at 90 degrees   Oral Care Recommendations: Oral care BID       [facility], MA CCC-SLP [phone]  Lynann Beaver 05/02/2018,3:46 PM

## 2018-05-02 NOTE — ED Notes (Signed)
Pt can go to MRI after CT

## 2018-05-02 NOTE — Consult Note (Signed)
Neurology Consultation Reason for Consult: Left-sided weakness Referring Physician: Rancour, S  CC: Left-sided weakness  History is obtained from: Patient, daughter  HPI: Katie Silva is a 84 y.o. female with a history of dementia who presents with left-sided weakness that was present on awakening.  Family noticed that she was more confused than typical, she was also slurring her speech and having some trouble using her left arm to feed herself.  She is complaining of left hip pain because of this states that she was unable to walk, but that it was more that it would just not work   LKW: Yesterday  tpa given?: no, outside of window    ROS: A 14 point ROS was performed and is negative except as noted in the HPI.   Past Medical History:  Diagnosis Date  . Anemia   . Arthritis    hands, knees  . Breast cancer, left breast ([place]) 2003   s/p mastectomy, chemo.  Dr. Chancy Milroy oncologist  . Fracture of humerus, proximal, right, closed 07/2010   s/p hemiarthroplasty  . Gastric ulcer with hemorrhage 02/14/2013  . Helicobacter pylori (H. pylori) infection 02/15/2013   eradication confirmed 05/2013 [facility]  . Hypertension   . Multiple duodenal ulcers 02/14/2013  . Osteoporosis      Family History  Problem Relation Age of Onset  . Myasthenia gravis Mother   . Coronary artery disease Father   . Colon cancer Sister      Social History:  reports that she has never smoked. She has never used smokeless tobacco. She reports that she does not drink alcohol or use drugs.   Exam: Current vital signs: BP (!) 186/75   Pulse 77   Temp 98.4 F (36.9 C) (Oral)   Resp 17   SpO2 97%  Vital signs in last 24 hours: Temp:  [98.4 F (36.9 C)] 98.4 F (36.9 C) (08/25 2203) Pulse Rate:  [64-77] 77 (08/26 0015) Resp:  [16-22] 17 (08/26 0015) BP: (182-199)/(65-108) 186/75 (08/26 0015) SpO2:  [97 %-99 %] 97 % (08/26 0015)   Physical Exam  Constitutional: Appears well-developed and well-nourished.   Psych: Affect appropriate to situation Eyes: No scleral injection HENT: No OP obstrucion Head: Normocephalic.  Cardiovascular: Normal rate and regular rhythm.  Respiratory: Effort normal, non-labored breathing GI: Soft.  No distension. There is no tenderness.  Skin: WDI  Neuro: Mental Status: Patient is awake, alert, oriented to person, place, month, year, and situation. Patient is able to give a clear and coherent history. No signs of aphasia or neglect Cranial Nerves: II: Visual Fields are full. Pupils are equal, round, and reactive to light.   III,IV, VI: EOMI without ptosis or diploplia.  V: Facial sensation is symmetric to temperature VII: Facial movement is symmetric.  VIII: hearing is intact to voice X: Uvula elevates symmetrically XI: Shoulder shrug is symmetric. XII: tongue is midline without atrophy or fasciculations.  Motor: Tone is normal. Bulk is normal.  She did not give good effort in either upper extremity, but it was relatively symmetric.  She does have 4/5 weakness of her left lower extremity, but this seems to be primarily limited by pain and better distally. Sensory: Sensation is symmetric to light touch and temperature in the arms and legs. Cerebellar: She has intact finger-nose-finger bilaterally   I have reviewed labs in epic and the results pertinent to this consultation are: CMP-normal creatinine  I have reviewed the images obtained: CT head-unremarkable  Impression: 84 year old female with possible stroke  causing left-sided weakness.  I do think there is a possibility that her left leg weakness could be primarily due to hip pain, but MRI of the brain would help clarify.  She is VAN negative and I expect a small subcortical infarct if there is one.  Recommendations: 1) MRI brain, stroke work-up if positive as follows:  - HgbA1c, fasting lipid panel  - Frequent neuro checks  - Echocardiogram  -CTA head neck  - Prophylactic therapy-Antiplatelet  med: Aspirin - dose 325mg  PO or 300mg  PR  - Risk factor modification  - Telemetry monitoring  - PT consult, OT consult, Speech consult  - Stroke team to follow   Roland Rack, MD [facility] [phone]  If 7pm- 7am, please page neurology on call as listed in [place].

## 2018-05-02 NOTE — H&P (Signed)
History and Physical    Katie Silva DOB: [birth year] DOA: 05/01/2018  PCP: Lucille Passy, MD  Patient coming from: Home  I have personally briefly reviewed patient's old medical records in [place]  Chief Complaint: L sided weakness  HPI: Katie Silva is a 84 y.o. female with medical history significant of dementia, HTN.  Patient presents to the ED with increased confusion, slurred speech, inability to ambulate, L>R sided weakness.  Some intermittent symptoms noticed a week ago, but really noticeable during the day today.   ED Course: MRI reveals multiple acute embolic sub-cm strokes.  Patient went into new onset A.Fib while in ED.   Review of Systems: As per HPI otherwise 10 point review of systems negative.   Past Medical History:  Diagnosis Date  . Anemia   . Arthritis    hands, knees  . Breast cancer, left breast ([place]) 2003   s/p mastectomy, chemo.  Dr. Chancy Milroy oncologist  . Fracture of humerus, proximal, right, closed 07/2010   s/p hemiarthroplasty  . Gastric ulcer with hemorrhage 02/14/2013  . Helicobacter pylori (H. pylori) infection 02/15/2013   eradication confirmed 05/2013 [facility]  . Hypertension   . Multiple duodenal ulcers 02/14/2013  . Osteoporosis     Past Surgical History:  Procedure Laterality Date  . ABDOMINAL HYSTERECTOMY    . BREATH TEK H PYLORI N/A 06/06/2013   Procedure: BREATH TEK H PYLORI;  Surgeon: Gatha Mayer, MD;  Location: [facility];  Service: Endoscopy;  Laterality: N/A;  . ESOPHAGOGASTRODUODENOSCOPY N/A 02/14/2013   Procedure: ESOPHAGOGASTRODUODENOSCOPY (EGD);  Surgeon: Gatha Mayer, MD;  Location: [facility];  Service: Endoscopy;  Laterality: N/A;  . FEMUR IM NAIL  04/13/2012   Procedure: INTRAMEDULLARY (IM) NAIL FEMORAL;  Surgeon: Melina Schools, MD;  Location: [facility] ORS;  Service: Orthopedics;  Laterality: Left;  . HIP SURGERY    . KNEE ARTHROSCOPY Bilateral    [facility]  . MASTECTOMY Left   .  SHOULDER HEMI-ARTHROPLASTY Right 07/2010   Dr. Mardelle Matte     reports that she has never smoked. She has never used smokeless tobacco. She reports that she does not drink alcohol or use drugs.  Allergies  Allergen Reactions  . Fexofenadine Hcl Other (See Comments)    Doesn't agree with patient  . Aricept [Donepezil Hcl] Other (See Comments)    nightmares  . Penicillins Other (See Comments)    Pt does not remember reaction to penicillin ("did not work")  . Tylenol [Acetaminophen] Other (See Comments)    "funny feeling in head."    Family History  Problem Relation Age of Onset  . Myasthenia gravis Mother   . Coronary artery disease Father   . Colon cancer Sister      Prior to Admission medications   Medication Sig Start Date End Date Taking? Authorizing Provider  lisinopril (PRINIVIL,ZESTRIL) 10 MG tablet Take 1 tablet (10 mg total) by mouth daily. 11/16/17  Yes Lucille Passy, MD  Misc Natural Products (OSTEO BI-FLEX JOINT SHIELD PO) Take 1 tablet by mouth daily.   Yes [provider]  Multiple Vitamin (MULTIVITAMIN WITH MINERALS) TABS tablet Take 1 tablet by mouth daily.   Yes [provider]    Physical Exam: Vitals:   05/02/18 0200 05/02/18 0215 05/02/18 0439 05/02/18 0445  BP: (!) 174/71 (!) 192/74 138/66 128/76  Pulse:  76 (!) 50 (!) 146  Resp: (!) 25 (!) 21 (!) 24 20  Temp:  TempSrc:      SpO2:  97% 97% 95%    Constitutional: NAD, calm, comfortable Eyes: PERRL, lids and conjunctivae normal ENMT: Mucous membranes are moist. Posterior pharynx clear of any exudate or lesions.Normal dentition.  Neck: normal, supple, no masses, no thyromegaly Respiratory: clear to auscultation bilaterally, no wheezing, no crackles. Normal respiratory effort. No accessory muscle use.  Cardiovascular: irr, irr Abdomen: no tenderness, no masses palpated. No hepatosplenomegaly. Bowel sounds positive.  Musculoskeletal: no clubbing / cyanosis. No joint deformity upper and  lower extremities. Good ROM, no contractures. Normal muscle tone.  Skin: no rashes, lesions, ulcers. No induration Neurologic: 4/5 on L side Psychiatric: Normal judgment and insight. Alert and oriented x 3. Normal mood.    Labs on Admission: I have personally reviewed following labs and imaging studies  CBC: Recent Labs  Lab 05/01/18 2206  WBC 9.3  NEUTROABS 5.2  HGB 12.9  HCT 41.6  MCV 88.1  PLT 607   Basic Metabolic Panel: Recent Labs  Lab 05/01/18 2206  NA 141  K 4.5  CL 109  CO2 24  GLUCOSE 101*  BUN 16  CREATININE 0.98  CALCIUM 9.3   GFR: CrCl cannot be calculated (Unknown ideal weight.). Liver Function Tests: Recent Labs  Lab 05/01/18 2206  AST 21  ALT 12  ALKPHOS 38  BILITOT 0.7  PROT 6.2*  ALBUMIN 3.8   No results for input(s): LIPASE, AMYLASE in the last 168 hours. No results for input(s): AMMONIA in the last 168 hours. Coagulation Profile: Recent Labs  Lab 05/01/18 2206  INR 1.01   Cardiac Enzymes: No results for input(s): CKTOTAL, CKMB, CKMBINDEX, TROPONINI in the last 168 hours. BNP (last 3 results) No results for input(s): PROBNP in the last 8760 hours. HbA1C: No results for input(s): HGBA1C in the last 72 hours. CBG: No results for input(s): GLUCAP in the last 168 hours. Lipid Profile: No results for input(s): CHOL, HDL, LDLCALC, TRIG, CHOLHDL, LDLDIRECT in the last 72 hours. Thyroid Function Tests: No results for input(s): TSH, T4TOTAL, FREET4, T3FREE, THYROIDAB in the last 72 hours. Anemia Panel: No results for input(s): VITAMINB12, FOLATE, FERRITIN, TIBC, IRON, RETICCTPCT in the last 72 hours. Urine analysis:    Component Value Date/Time   COLORURINE YELLOW 05/01/2018 2300   APPEARANCEUR CLEAR 05/01/2018 2300   LABSPEC 1.012 05/01/2018 2300   PHURINE 6.0 05/01/2018 2300   GLUCOSEU NEGATIVE 05/01/2018 2300   HGBUR NEGATIVE 05/01/2018 2300   BILIRUBINUR NEGATIVE 05/01/2018 2300   BILIRUBINUR Negative 11/29/2017 1605    KETONESUR NEGATIVE 05/01/2018 2300   PROTEINUR NEGATIVE 05/01/2018 2300   UROBILINOGEN 0.2 11/29/2017 1605   UROBILINOGEN 0.2 10/08/2009 1326   NITRITE NEGATIVE 05/01/2018 2300   LEUKOCYTESUR TRACE (A) 05/01/2018 2300    Radiological Exams on Admission: Ct Angio Head W Or Wo Contrast  Result Date: 05/02/2018 CLINICAL DATA:  84 y/o  F; left-sided weakness and slurred speech. EXAM: CT ANGIOGRAPHY HEAD AND NECK TECHNIQUE: Multidetector CT imaging of the head and neck was performed using the standard protocol during bolus administration of intravenous contrast. Multiplanar CT image reconstructions and MIPs were obtained to evaluate the vascular anatomy. Carotid stenosis measurements (when applicable) are obtained utilizing NASCET criteria, using the distal internal carotid diameter as the denominator. CONTRAST:  50 cc Isovue 370 COMPARISON:  05/01/2018 CT head FINDINGS: CTA NECK FINDINGS Aortic arch: 4.2 cm ascending aortic aneurysm. Standard branching. Mild calcific atherosclerosis of the aortic arch. Right carotid system: No evidence of dissection, stenosis (50% or greater) or  occlusion. Left carotid system: No evidence of dissection, stenosis (50% or greater) or occlusion. Vertebral arteries: Left dominance. No evidence of dissection, stenosis (50% or greater) or occlusion. Skeleton: C3-4 grade 1 retrolisthesis and C4-5 grade 1 anterolisthesis. Prominent bilateral upper cervical facet arthropathy. Discogenic degenerative changes with loss of disc space height at the C3-4 and C5-C7 levels. Other neck: 10 mm nodule within the thyroid left lobe. Upper chest: Negative. Review of the MIP images confirms the above findings CTA HEAD FINDINGS Anterior circulation: Calcific atherosclerosis of the carotid siphons with moderate right and mild left paraclinoid stenosis. Posterior circulation: No significant stenosis, proximal occlusion, aneurysm, or vascular malformation. Venous sinuses: As permitted by contrast  timing, patent. Anatomic variants: None significant. Delayed phase: No abnormal intracranial enhancement. Review of the MIP images confirms the above findings IMPRESSION: 1. Patent carotid and vertebral arteries. No dissection, aneurysm, or hemodynamically significant stenosis utilizing NASCET criteria. 2. Patent anterior and posterior intracranial circulation. No large vessel occlusion, aneurysm, or high-grade stenosis. 3. 4.2 cm ascending aortic aneurysm. Recommend annual imaging followup by CTA or MRA. This recommendation follows 2010 ACCF/AHA/AATS/ACR/ASA/SCA/SCAI/SIR/STS/SVM Guidelines for the Diagnosis and Management of Patients with Thoracic Aortic Disease. Circulation. 2010; 121: G536-I680. 4. Calcific atherosclerosis of the carotid siphons with moderate right and mild left paraclinoid stenosis. Electronically Signed   By: Kristine Garbe M.D.   On: 05/02/2018 03:30   Ct Head Wo Contrast  Result Date: 05/01/2018 CLINICAL DATA:  84 year old female with stroke-like symptoms. EXAM: CT HEAD WITHOUT CONTRAST TECHNIQUE: Contiguous axial images were obtained from the base of the skull through the vertex without intravenous contrast. COMPARISON:  Head CT dated 06/15/2016 FINDINGS: Brain: There is mild age-related atrophy and chronic microvascular ischemic changes. A small area of old infarct and noted involving the anterior horn of the left internal capsule. There is no acute intracranial hemorrhage. No mass effect or midline shift. No extra-axial fluid collection. Vascular: No hyperdense vessel or unexpected calcification. Skull: Normal. Negative for fracture or focal lesion. Sinuses/Orbits: No acute finding. Other: None IMPRESSION: 1. No acute intracranial pathology. 2. Age-related atrophy and chronic microvascular ischemic changes. Electronically Signed   By: Anner Crete M.D.   On: 05/01/2018 23:26   Ct Angio Neck W Or Wo Contrast  Result Date: 05/02/2018 CLINICAL DATA:  84 y/o  F;  left-sided weakness and slurred speech. EXAM: CT ANGIOGRAPHY HEAD AND NECK TECHNIQUE: Multidetector CT imaging of the head and neck was performed using the standard protocol during bolus administration of intravenous contrast. Multiplanar CT image reconstructions and MIPs were obtained to evaluate the vascular anatomy. Carotid stenosis measurements (when applicable) are obtained utilizing NASCET criteria, using the distal internal carotid diameter as the denominator. CONTRAST:  50 cc Isovue 370 COMPARISON:  05/01/2018 CT head FINDINGS: CTA NECK FINDINGS Aortic arch: 4.2 cm ascending aortic aneurysm. Standard branching. Mild calcific atherosclerosis of the aortic arch. Right carotid system: No evidence of dissection, stenosis (50% or greater) or occlusion. Left carotid system: No evidence of dissection, stenosis (50% or greater) or occlusion. Vertebral arteries: Left dominance. No evidence of dissection, stenosis (50% or greater) or occlusion. Skeleton: C3-4 grade 1 retrolisthesis and C4-5 grade 1 anterolisthesis. Prominent bilateral upper cervical facet arthropathy. Discogenic degenerative changes with loss of disc space height at the C3-4 and C5-C7 levels. Other neck: 10 mm nodule within the thyroid left lobe. Upper chest: Negative. Review of the MIP images confirms the above findings CTA HEAD FINDINGS Anterior circulation: Calcific atherosclerosis of the carotid siphons with moderate right and  mild left paraclinoid stenosis. Posterior circulation: No significant stenosis, proximal occlusion, aneurysm, or vascular malformation. Venous sinuses: As permitted by contrast timing, patent. Anatomic variants: None significant. Delayed phase: No abnormal intracranial enhancement. Review of the MIP images confirms the above findings IMPRESSION: 1. Patent carotid and vertebral arteries. No dissection, aneurysm, or hemodynamically significant stenosis utilizing NASCET criteria. 2. Patent anterior and posterior intracranial  circulation. No large vessel occlusion, aneurysm, or high-grade stenosis. 3. 4.2 cm ascending aortic aneurysm. Recommend annual imaging followup by CTA or MRA. This recommendation follows 2010 ACCF/AHA/AATS/ACR/ASA/SCA/SCAI/SIR/STS/SVM Guidelines for the Diagnosis and Management of Patients with Thoracic Aortic Disease. Circulation. 2010; 121: T017-B939. 4. Calcific atherosclerosis of the carotid siphons with moderate right and mild left paraclinoid stenosis. Electronically Signed   By: Kristine Garbe M.D.   On: 05/02/2018 03:30   Mr Brain Wo Contrast  Result Date: 05/02/2018 CLINICAL DATA:  84 y/o  F; stroke for follow-up. EXAM: MRI HEAD WITHOUT CONTRAST TECHNIQUE: Multiplanar, multiecho pulse sequences of the brain and surrounding structures were obtained without intravenous contrast. COMPARISON:  05/01/2018 CT head.  05/02/2018 CTA head. FINDINGS: Brain: Subcentimeter foci of reduced diffusion are present within the left posterior frontal subcortical white matter, right caudate body, right putamen, right caudate head, right hemi pons, and bilateral cerebellar hemispheres compatible with acute/early subacute infarction. No associated hemorrhage or mass effect. Several nonspecific T2 FLAIR hyperintensities in subcortical and periventricular white matter are compatible with moderate chronic microvascular ischemic changes for age. Moderate volume loss of the brain. No extra-axial collection, hydrocephalus, or herniation. Small foci of susceptibility hypointensity are present in a predominantly central distribution within the basal ganglia compatible with hemosiderin deposition of chronic microhemorrhage. The central distribution favors hypertensive etiology. Vascular: Normal flow voids. Skull and upper cervical spine: Normal marrow signal. Sinuses/Orbits: Negative. Other: None. IMPRESSION: Subcentimeter foci of acute/early subacute infarction are present within the left posterior frontal subcortical  white matter, right caudate body, right putamen, right caudate head, right hemi pons, and bilateral cerebellar hemispheres. No associated hemorrhage or mass effect. Multiple vascular territories probably represents embolic etiology. Electronically Signed   By: Kristine Garbe M.D.   On: 05/02/2018 04:16    EKG: Independently reviewed.  Assessment/Plan Principal Problem:   Acute cardioembolic stroke [facility]) Active Problems:   HTN (hypertension)   Dementia   New onset atrial fibrillation ([employer])    1. Acute embolic strokes - 1. Stroke pathway 2. 2d echo 3. Neuro consult 4. ASA 325 for now 5. PT/OT/SLP 6. NS at 77 while NPO 2. New onset PAF - 1. Rate currently 90-110 2. Per neuro dont start full anticoagulation yet due to risk of bleed. 3. HTN - hold lisinopril  DVT prophylaxis: Lovenox [place] Code Status: DNR/DNI - per daughter Family Communication: Daughter at bedside Disposition Plan: TBD Consults called: Neuro Admission status: Admit to inpatient - IP status for confirmed acute strokes on imaging   Etta Quill DO [name] Pager [phone] Only works nights!  If 7AM-7PM, please contact the primary day team physician taking care of patient  [email or web address] Password [email or web address]  05/02/2018, 5:11 AM

## 2018-05-02 NOTE — ED Notes (Addendum)
pts daughter calling out requesting medication to help pt relax. Rancour, MD notified of request.

## 2018-05-02 NOTE — ED Notes (Signed)
Attempted to call report to 4N, RN will return call for report

## 2018-05-02 NOTE — Consult Note (Signed)
Physical Medicine and Rehabilitation Consult Reason for Consult: Decreased functional mobility with left-sided weakness and slurred speech Referring Physician: [facility]   HPI: Katie Silva is a 84 y.o. right-handed female with history of dementia and hypertension.  Per chart review and family, patient lives alone with assistance from family in the evening and she attends adult daycare during the day.  She required assistance with all ADLs. Used a straight point cane for ambulation.  One level home with 5 steps to entry.   Presented 05/02/2018 with left-sided weakness, slurred speech and altered mental status.  Cranial CT scan reviewed, unremarkable for acute intracranial process. Patient did not receive TPA.  CT angiogram of head and neck showed no large vessel occlusion, aneurysm or high-grade stenosis.  MRI subcentimeter foci of acute/early subacute infarction present within the left posterior frontal subcortical white matter, right caudate body, right putamen, right caudate head, right hemi-pons and bilateral cerebellar hemisphere.  Echocardiogram with ejection fraction of 28% grade 1 diastolic dysfunction.  EKG new onset atrial fibrillation.  Presently on aspirin for CVA prophylaxis.  Subcutaneous Lovenox for DVT prophylaxis.  Therapy evaluation completed with recommendations of physical medicine rehab consult.  Review of Systems  Unable to perform ROS: Dementia   Past Medical History:  Diagnosis Date  . Anemia   . Arthritis    hands, knees  . Breast cancer, left breast ([place]) 2003   s/p mastectomy, chemo.  Dr. Chancy Milroy oncologist  . Fracture of humerus, proximal, right, closed 07/2010   s/p hemiarthroplasty  . Gastric ulcer with hemorrhage 02/14/2013  . Helicobacter pylori (H. pylori) infection 02/15/2013   eradication confirmed 05/2013 [facility]  . Hypertension   . Multiple duodenal ulcers 02/14/2013  . Osteoporosis    Past Surgical History:  Procedure Laterality Date  . ABDOMINAL  HYSTERECTOMY    . BREATH TEK H PYLORI N/A 06/06/2013   Procedure: BREATH TEK H PYLORI;  Surgeon: Gatha Mayer, MD;  Location: [facility];  Service: Endoscopy;  Laterality: N/A;  . ESOPHAGOGASTRODUODENOSCOPY N/A 02/14/2013   Procedure: ESOPHAGOGASTRODUODENOSCOPY (EGD);  Surgeon: Gatha Mayer, MD;  Location: [facility];  Service: Endoscopy;  Laterality: N/A;  . FEMUR IM NAIL  04/13/2012   Procedure: INTRAMEDULLARY (IM) NAIL FEMORAL;  Surgeon: Melina Schools, MD;  Location: [facility];  Service: Orthopedics;  Laterality: Left;  . HIP SURGERY    . KNEE ARTHROSCOPY Bilateral    BIlateral knees at [facility]  . MASTECTOMY Left   . SHOULDER HEMI-ARTHROPLASTY Right 07/2010   Dr. Mardelle Matte   Family History  Problem Relation Age of Onset  . Myasthenia gravis Mother   . Coronary artery disease Father   . Colon cancer Sister    Social History:  reports that she has never smoked. She has never used smokeless tobacco. She reports that she does not drink alcohol or use drugs. Allergies:  Allergies  Allergen Reactions  . Fexofenadine Hcl Other (See Comments)    Doesn't agree with patient  . Aricept [Donepezil Hcl] Other (See Comments)    nightmares  . Penicillins Other (See Comments)    Pt does not remember reaction to penicillin ("did not work")  . Tylenol [Acetaminophen] Other (See Comments)    "funny feeling in head."   Medications Prior to Admission  Medication Sig Dispense Refill  . lisinopril (PRINIVIL,ZESTRIL) 10 MG tablet Take 1 tablet (10 mg total) by mouth daily. 90 tablet 2  . Misc Natural Products (OSTEO BI-FLEX JOINT SHIELD PO) Take 1 tablet  by mouth daily.    . Multiple Vitamin (MULTIVITAMIN WITH MINERALS) TABS tablet Take 1 tablet by mouth daily.      Home: Home Living Family/patient expects to be discharged to:: Private residence Living Arrangements: Alone Available Help at Discharge: Available 24 hours/day, Family Type of Home: House Home Access: Stairs to enter, Teacher, early years/pre of Steps: 5 Entrance Stairs-Rails: Right Home Layout: One level Bathroom Shower/Tub: Tub/shower unit, Walk-in shower Home Equipment: Environmental consultant - 2 wheels, [employer] - single point, Bedside commode, Shower seat  Lives With: Alone(goes to adult day care, 24 hour supervision at home)  Functional History: Prior Function Level of Independence: Needs assistance Gait / Transfers Assistance Needed: Uses SPC for ambulation. No falls in last year per daughter.  ADL's / Homemaking Assistance Needed: Daughters assist with bathing.  Comments: Goes to daycare daily.  Functional Status:  Mobility: Bed Mobility Overal bed mobility: Needs Assistance Bed Mobility: Supine to Sit, Sit to Supine Supine to sit: Mod assist, HOB elevated Sit to supine: Mod assist, HOB elevated General bed mobility comments: Increased cues to get to EOB. Assist with LEs and to scoot bottom. Pt with good initiation. Reluctant to use LUE. Transfers Overall transfer level: Needs assistance Equipment used: Rolling walker (2 wheeled) Transfers: Sit to/from Stand Sit to Stand: Mod assist General transfer comment: Able to get to half standing but difficulty getting upright due to posterior lean and decreased left knee flexion. + dizziness as well. Ambulation/Gait General Gait Details: Unable this session.    ADL:    Cognition: Cognition Overall Cognitive Status: Impaired/Different from baseline Arousal/Alertness: Awake/alert Orientation Level: Oriented to person, Disoriented to place, Disoriented to time, Disoriented to situation Attention: Focused, Sustained Focused Attention: Appears intact Sustained Attention: Appears intact Memory: Impaired Memory Impairment: Decreased short term memory Decreased Short Term Memory: Verbal basic, Functional basic Awareness: Impaired Awareness Impairment: Intellectual impairment Problem Solving: Impaired Problem Solving Impairment: Verbal basic, Functional  basic Safety/Judgment: Impaired Cognition Arousal/Alertness: Awake/alert Behavior During Therapy: WFL for tasks assessed/performed Overall Cognitive Status: Impaired/Different from baseline Area of Impairment: Orientation, Memory, Following commands, Awareness, Problem solving Orientation Level: Disoriented to, Situation, Time Memory: Decreased short-term memory Following Commands: Follows one step commands with increased time Awareness: Intellectual Problem Solving: Requires verbal cues General Comments: Hx of dementia. Not able to state year of birth but able to state month/date. Some left inattention noted.   Blood pressure (!) 129/53, pulse 61, temperature (!) 97.4 F (36.3 C), resp. rate 18, height 5\' 6"  (1.676 m), weight 110.6 kg, SpO2 98 %. Physical Exam  Vitals reviewed. Constitutional: She appears well-developed.  Frail  HENT:  Head: Normocephalic and atraumatic.  Eyes: EOM are normal. Right eye exhibits no discharge. Left eye exhibits no discharge.  Neck: Normal range of motion. Neck supple. No thyromegaly present.  Cardiovascular: Normal rate and regular rhythm.  Respiratory: Effort normal and breath sounds normal. No respiratory distress.  GI: Soft. Bowel sounds are normal. She exhibits no distension.  Musculoskeletal:  No edema or tenderness in extremities  Neurological: She is alert.  Alert and Oriented x1 Patient is somewhat agitated.   Left intattention Motor: Limited by willingness to follow commands, however, >/ 3/5 throughout  Skin: Skin is warm and dry.  Psychiatric: Her affect is blunt. Her speech is delayed and slurred. She is agitated and slowed. Cognition and memory are impaired. She expresses inappropriate judgment. She exhibits abnormal recent memory and abnormal remote memory.    Results for orders placed or performed during  the hospital encounter of 05/01/18 (from the past 24 hour(s))  CBC with Differential     Status: Abnormal   Collection Time:  05/01/18 10:06 PM  Result Value Ref Range   WBC 9.3 4.0 - 10.5 K/uL   RBC 4.72 3.87 - 5.11 MIL/uL   Hemoglobin 12.9 12.0 - 15.0 g/dL   HCT 41.6 36.0 - 46.0 %   MCV 88.1 78.0 - 100.0 fL   MCH 27.3 26.0 - 34.0 pg   MCHC 31.0 30.0 - 36.0 g/dL   RDW 14.9 11.5 - 15.5 %   Platelets 230 150 - 400 K/uL   Neutrophils Relative % 56 %   Neutro Abs 5.2 1.7 - 7.7 K/uL   Lymphocytes Relative 28 %   Lymphs Abs 2.6 0.7 - 4.0 K/uL   Monocytes Relative 15 %   Monocytes Absolute 1.4 (H) 0.1 - 1.0 K/uL   Eosinophils Relative 1 %   Eosinophils Absolute 0.1 0.0 - 0.7 K/uL   Basophils Relative 0 %   Basophils Absolute 0.0 0.0 - 0.1 K/uL   Immature Granulocytes 0 %   Abs Immature Granulocytes 0.0 0.0 - 0.1 K/uL  Comprehensive metabolic panel     Status: Abnormal   Collection Time: 05/01/18 10:06 PM  Result Value Ref Range   Sodium 141 135 - 145 mmol/L   Potassium 4.5 3.5 - 5.1 mmol/L   Chloride 109 98 - 111 mmol/L   CO2 24 22 - 32 mmol/L   Glucose, Bld 101 (H) 70 - 99 mg/dL   BUN 16 8 - 23 mg/dL   Creatinine, Ser 0.98 0.44 - 1.00 mg/dL   Calcium 9.3 8.9 - 10.3 mg/dL   Total Protein 6.2 (L) 6.5 - 8.1 g/dL   Albumin 3.8 3.5 - 5.0 g/dL   AST 21 15 - 41 U/L   ALT 12 0 - 44 U/L   Alkaline Phosphatase 38 38 - 126 U/L   Total Bilirubin 0.7 0.3 - 1.2 mg/dL   GFR calc non Af Amer 49 (L) >60 mL/min   GFR calc Af Amer 57 (L) >60 mL/min   Anion gap 8 5 - 15  Protime-INR     Status: None   Collection Time: 05/01/18 10:06 PM  Result Value Ref Range   Prothrombin Time 13.2 11.4 - 15.2 seconds   INR 1.01   Urinalysis, Routine w reflex microscopic     Status: Abnormal   Collection Time: 05/01/18 11:00 PM  Result Value Ref Range   Color, Urine YELLOW YELLOW   APPearance CLEAR CLEAR   Specific Gravity, Urine 1.012 1.005 - 1.030   pH 6.0 5.0 - 8.0   Glucose, UA NEGATIVE NEGATIVE mg/dL   Hgb urine dipstick NEGATIVE NEGATIVE   Bilirubin Urine NEGATIVE NEGATIVE   Ketones, ur NEGATIVE NEGATIVE mg/dL    Protein, ur NEGATIVE NEGATIVE mg/dL   Nitrite NEGATIVE NEGATIVE   Leukocytes, UA TRACE (A) NEGATIVE   RBC / HPF 0-5 0 - 5 RBC/hpf   WBC, UA 6-10 0 - 5 WBC/hpf   Bacteria, UA NONE SEEN NONE SEEN  Urine rapid drug screen (hosp performed)     Status: None   Collection Time: 05/01/18 11:45 PM  Result Value Ref Range   Opiates NONE DETECTED NONE DETECTED   Cocaine NONE DETECTED NONE DETECTED   Benzodiazepines NONE DETECTED NONE DETECTED   Amphetamines NONE DETECTED NONE DETECTED   Tetrahydrocannabinol NONE DETECTED NONE DETECTED   Barbiturates NONE DETECTED NONE DETECTED  Ethanol  Status: None   Collection Time: 05/02/18 12:42 AM  Result Value Ref Range   Alcohol, Ethyl (B) <10 <10 mg/dL  APTT     Status: None   Collection Time: 05/02/18 12:42 AM  Result Value Ref Range   aPTT 32 24 - 36 seconds  MRSA PCR Screening     Status: None   Collection Time: 05/02/18  6:43 AM  Result Value Ref Range   MRSA by PCR NEGATIVE NEGATIVE   Ct Angio Head W Or Wo Contrast  Result Date: 05/02/2018 CLINICAL DATA:  84 y/o  F; left-sided weakness and slurred speech. EXAM: CT ANGIOGRAPHY HEAD AND NECK TECHNIQUE: Multidetector CT imaging of the head and neck was performed using the standard protocol during bolus administration of intravenous contrast. Multiplanar CT image reconstructions and MIPs were obtained to evaluate the vascular anatomy. Carotid stenosis measurements (when applicable) are obtained utilizing NASCET criteria, using the distal internal carotid diameter as the denominator. CONTRAST:  50 cc Isovue 370 COMPARISON:  05/01/2018 CT head FINDINGS: CTA NECK FINDINGS Aortic arch: 4.2 cm ascending aortic aneurysm. Standard branching. Mild calcific atherosclerosis of the aortic arch. Right carotid system: No evidence of dissection, stenosis (50% or greater) or occlusion. Left carotid system: No evidence of dissection, stenosis (50% or greater) or occlusion. Vertebral arteries: Left dominance. No  evidence of dissection, stenosis (50% or greater) or occlusion. Skeleton: C3-4 grade 1 retrolisthesis and C4-5 grade 1 anterolisthesis. Prominent bilateral upper cervical facet arthropathy. Discogenic degenerative changes with loss of disc space height at the C3-4 and C5-C7 levels. Other neck: 10 mm nodule within the thyroid left lobe. Upper chest: Negative. Review of the MIP images confirms the above findings CTA HEAD FINDINGS Anterior circulation: Calcific atherosclerosis of the carotid siphons with moderate right and mild left paraclinoid stenosis. Posterior circulation: No significant stenosis, proximal occlusion, aneurysm, or vascular malformation. Venous sinuses: As permitted by contrast timing, patent. Anatomic variants: None significant. Delayed phase: No abnormal intracranial enhancement. Review of the MIP images confirms the above findings IMPRESSION: 1. Patent carotid and vertebral arteries. No dissection, aneurysm, or hemodynamically significant stenosis utilizing NASCET criteria. 2. Patent anterior and posterior intracranial circulation. No large vessel occlusion, aneurysm, or high-grade stenosis. 3. 4.2 cm ascending aortic aneurysm. Recommend annual imaging followup by CTA or MRA. This recommendation follows 2010 ACCF/AHA/AATS/ACR/ASA/SCA/SCAI/SIR/STS/SVM Guidelines for the Diagnosis and Management of Patients with Thoracic Aortic Disease. Circulation. 2010; 121: S341-D622. 4. Calcific atherosclerosis of the carotid siphons with moderate right and mild left paraclinoid stenosis. Electronically Signed   By: Kristine Garbe M.D.   On: 05/02/2018 03:30   Ct Head Wo Contrast  Result Date: 05/01/2018 CLINICAL DATA:  84 year old female with stroke-like symptoms. EXAM: CT HEAD WITHOUT CONTRAST TECHNIQUE: Contiguous axial images were obtained from the base of the skull through the vertex without intravenous contrast. COMPARISON:  Head CT dated 06/15/2016 FINDINGS: Brain: There is mild age-related  atrophy and chronic microvascular ischemic changes. A small area of old infarct and noted involving the anterior horn of the left internal capsule. There is no acute intracranial hemorrhage. No mass effect or midline shift. No extra-axial fluid collection. Vascular: No hyperdense vessel or unexpected calcification. Skull: Normal. Negative for fracture or focal lesion. Sinuses/Orbits: No acute finding. Other: None IMPRESSION: 1. No acute intracranial pathology. 2. Age-related atrophy and chronic microvascular ischemic changes. Electronically Signed   By: Anner Crete M.D.   On: 05/01/2018 23:26   Ct Angio Neck W Or Wo Contrast  Result Date: 05/02/2018 CLINICAL DATA:  84 y/o  F; left-sided weakness and slurred speech. EXAM: CT ANGIOGRAPHY HEAD AND NECK TECHNIQUE: Multidetector CT imaging of the head and neck was performed using the standard protocol during bolus administration of intravenous contrast. Multiplanar CT image reconstructions and MIPs were obtained to evaluate the vascular anatomy. Carotid stenosis measurements (when applicable) are obtained utilizing NASCET criteria, using the distal internal carotid diameter as the denominator. CONTRAST:  50 cc Isovue 370 COMPARISON:  05/01/2018 CT head FINDINGS: CTA NECK FINDINGS Aortic arch: 4.2 cm ascending aortic aneurysm. Standard branching. Mild calcific atherosclerosis of the aortic arch. Right carotid system: No evidence of dissection, stenosis (50% or greater) or occlusion. Left carotid system: No evidence of dissection, stenosis (50% or greater) or occlusion. Vertebral arteries: Left dominance. No evidence of dissection, stenosis (50% or greater) or occlusion. Skeleton: C3-4 grade 1 retrolisthesis and C4-5 grade 1 anterolisthesis. Prominent bilateral upper cervical facet arthropathy. Discogenic degenerative changes with loss of disc space height at the C3-4 and C5-C7 levels. Other neck: 10 mm nodule within the thyroid left lobe. Upper chest: Negative.  Review of the MIP images confirms the above findings CTA HEAD FINDINGS Anterior circulation: Calcific atherosclerosis of the carotid siphons with moderate right and mild left paraclinoid stenosis. Posterior circulation: No significant stenosis, proximal occlusion, aneurysm, or vascular malformation. Venous sinuses: As permitted by contrast timing, patent. Anatomic variants: None significant. Delayed phase: No abnormal intracranial enhancement. Review of the MIP images confirms the above findings IMPRESSION: 1. Patent carotid and vertebral arteries. No dissection, aneurysm, or hemodynamically significant stenosis utilizing NASCET criteria. 2. Patent anterior and posterior intracranial circulation. No large vessel occlusion, aneurysm, or high-grade stenosis. 3. 4.2 cm ascending aortic aneurysm. Recommend annual imaging followup by CTA or MRA. This recommendation follows 2010 ACCF/AHA/AATS/ACR/ASA/SCA/SCAI/SIR/STS/SVM Guidelines for the Diagnosis and Management of Patients with Thoracic Aortic Disease. Circulation. 2010; 121: H476-L465. 4. Calcific atherosclerosis of the carotid siphons with moderate right and mild left paraclinoid stenosis. Electronically Signed   By: Kristine Garbe M.D.   On: 05/02/2018 03:30   Mr Brain Wo Contrast  Result Date: 05/02/2018 CLINICAL DATA:  84 y/o  F; stroke for follow-up. EXAM: MRI HEAD WITHOUT CONTRAST TECHNIQUE: Multiplanar, multiecho pulse sequences of the brain and surrounding structures were obtained without intravenous contrast. COMPARISON:  05/01/2018 CT head.  05/02/2018 CTA head. FINDINGS: Brain: Subcentimeter foci of reduced diffusion are present within the left posterior frontal subcortical white matter, right caudate body, right putamen, right caudate head, right hemi pons, and bilateral cerebellar hemispheres compatible with acute/early subacute infarction. No associated hemorrhage or mass effect. Several nonspecific T2 FLAIR hyperintensities in subcortical  and periventricular white matter are compatible with moderate chronic microvascular ischemic changes for age. Moderate volume loss of the brain. No extra-axial collection, hydrocephalus, or herniation. Small foci of susceptibility hypointensity are present in a predominantly central distribution within the basal ganglia compatible with hemosiderin deposition of chronic microhemorrhage. The central distribution favors hypertensive etiology. Vascular: Normal flow voids. Skull and upper cervical spine: Normal marrow signal. Sinuses/Orbits: Negative. Other: None. IMPRESSION: Subcentimeter foci of acute/early subacute infarction are present within the left posterior frontal subcortical white matter, right caudate body, right putamen, right caudate head, right hemi pons, and bilateral cerebellar hemispheres. No associated hemorrhage or mass effect. Multiple vascular territories probably represents embolic etiology. Electronically Signed   By: Kristine Garbe M.D.   On: 05/02/2018 04:16    Assessment/Plan: Diagnosis: B/l infarcts Labs and images (see above) independently reviewed.  Records reviewed and summated above. Stroke: Continue secondary  stroke prophylaxis and Risk Factor Modification listed below:   Antiplatelet therapy:   Blood Pressure Management:  Continue current medication with prn's with permisive HTN per primary team Statin Agent:   Motor recovery: Fluoxetine  1. Does the need for close, 24 hr/day medical supervision in concert with the patient's rehab needs make it unreasonable for this patient to be served in a less intensive setting? Potentially  2. Co-Morbidities requiring supervision/potential complications: atrial fibrillation (cont meds, monitor HR with increased mobility), diastolic dysfunction (monitor for signs/symptoms fluid overload), dementia, HTN (monitor and provide prns in accordance with increased physical exertion and pain), CKD (avoid nephrotoxic meds) 3. Due to  safety, disease management, medication administration and patient education, does the patient require 24 hr/day rehab nursing? Yes 4. Does the patient require coordinated care of a physician, rehab nurse, PT (1-2 hrs/day, 5 days/week) and OT (1-2 hrs/day, 5 days/week) to address physical and functional deficits in the context of the above medical diagnosis(es)? Yes Addressing deficits in the following areas: balance, endurance, locomotion, strength, transferring, bathing, dressing, toileting and psychosocial support 5. Can the patient actively participate in an intensive therapy program of at least 3 hrs of therapy per day at least 5 days per week? Yes 6. The potential for patient to make measurable gains while on inpatient rehab is excellent 7. Anticipated functional outcomes upon discharge from inpatient rehab are min assist  with PT, min assist with OT, n/a with SLP. 8. Estimated rehab length of stay to reach the above functional goals is: 10-14 days. 9. Anticipated D/C setting: Home 10. Anticipated post D/C treatments: HH therapy and Home excercise program 11. Overall Rehab/Functional Prognosis: good  RECOMMENDATIONS: This patient's condition is appropriate for continued rehabilitative care in the following setting: CIR if 24/7 caregiver support available.  Recommend palliative care consult. Patient has agreed to participate in recommended program. Potentially Note that insurance prior authorization may be required for reimbursement for recommended care.  Comment: Rehab Admissions Coordinator to follow up.   I have personally performed a face to face diagnostic evaluation, including, but not limited to relevant history and physical exam findings, of this patient and developed relevant assessment and plan.  Additionally, I have reviewed and concur with the physician assistant's documentation above.   Delice Lesch, MD, ABPMR Lavon Paganini Angiulli, PA-C 05/02/2018

## 2018-05-02 NOTE — Progress Notes (Signed)
  Rehab Admissions Coordinator Note:  Patient was screened by Cleatrice Burke for appropriateness for an Inpatient Acute Rehab Consult per PT recommendation.  At this time, we are recommending Inpatient Rehab consult. Please place order if pt would like to be considered for admit.  Cleatrice Burke 05/02/2018, 10:33 AM  I can be reached at [phone].

## 2018-05-02 NOTE — Progress Notes (Signed)
  Echocardiogram 2D Echocardiogram has been performed.  Katie Silva 05/02/2018, 10:33 AM

## 2018-05-02 NOTE — ED Notes (Signed)
Family updated pt will be gone from room for CT and MRI.

## 2018-05-03 ENCOUNTER — Inpatient Hospital Stay (HOSPITAL_COMMUNITY): Payer: Medicare PPO

## 2018-05-03 LAB — LIPID PANEL
CHOL/HDL RATIO: 2.9 ratio
Cholesterol: 141 mg/dL (ref 0–200)
HDL: 48 mg/dL (ref >40)
LDL CALC: 85 mg/dL (ref 0–99)
TRIGLYCERIDES: 39 mg/dL (ref <150)
VLDL: 8 mg/dL (ref 0–40)

## 2018-05-03 LAB — HEMOGLOBIN A1C
HEMOGLOBIN A1C: 5.2 % (ref 4.8–5.6)
Mean Plasma Glucose: 102.54 mg/dL

## 2018-05-03 MED ORDER — PRAVASTATIN SODIUM 20 MG PO TABS
20.0000 mg | ORAL_TABLET | Freq: Every day | ORAL | Status: DC
  Administered 2018-05-03 – 2018-05-07 (×4): 20 mg via ORAL
  Filled 2018-05-03 (×4): qty 1

## 2018-05-03 MED ORDER — LORAZEPAM 2 MG/ML IJ SOLN
1.0000 mg | Freq: Once | INTRAMUSCULAR | Status: AC
  Administered 2018-05-03: 1 mg via INTRAVENOUS
  Filled 2018-05-03: qty 1

## 2018-05-03 MED ORDER — HALOPERIDOL LACTATE 5 MG/ML IJ SOLN
2.5000 mg | Freq: Once | INTRAMUSCULAR | Status: AC
  Administered 2018-05-03: 2.5 mg via INTRAVENOUS
  Filled 2018-05-03: qty 1

## 2018-05-03 NOTE — Progress Notes (Signed)
PROGRESS NOTE  Katie Silva KGM:[id number] DOB: 08-29-1926 DOA: 05/01/2018 PCP: Lucille Passy, MD   LOS: 1 day   Brief Narrative / Interim history: Pleasant 84 year old female with history of mild dementia, hypertension, who was admitted to the hospital on 8/26 with increased confusion, slurred speech and left-sided weakness.  MRI on admission showed multiple strokes, likely embolic in nature.  She was also found to have paroxysmal A. fib.  Neurology was consulted.  Assessment & Plan: Principal Problem:   Acute cardioembolic stroke [facility]) Active Problems:   HTN (hypertension)   Dementia   New onset atrial fibrillation (HCC)   Atrial fibrillation with RVR (HCC)   Cerebrovascular accident (CVA) due to embolism of precerebral artery (HCC)   Diastolic dysfunction   Benign essential HTN   Stage 3 chronic kidney disease ([place])   Acute stroke -MRI consistent with embolic stroke, neurology following appreciate input.  This is likely in the setting of paroxysmal A. fib as she goes in and out of A. fib on telemetry. -She underwent a 2D echo which showed normal EF and grade 1 diastolic dysfunction (full read below) -A1c 5.2, lipid panel showed an LDL of 85.  She is on statin, continue -Passed swallow eval, she is on a regular diet, PT recommended CIR -Left sided weakness improving, slurred speech improving  Atrial fibrillation - patient's CHA2DS2-VASc Score for Stroke Risk is > 2, start Eliquis per neurology.  Hypertension -For now allow permissive hypertension  Chronic kidney disease stage III -Creatinine is at baseline  Mild dementia -Exacerbated by CVA and hospital induced delirium, worse at night and is leery orientable in the daytime   DVT prophylaxis: Eliquis Code Status: DNR Family Communication: son present at bedside  Disposition Plan: CIR pending approval  Consultants:   Neurology   Procedures:   2D echo:  Impressions: - LVEF 60-65%, moderate LVH, normal wall  motion, grade 1 DD, elevated LV filling pressure, aortic sclerosis with moderate AI, heavy MAC with end-systolic bileaflet prolapse and trivial MR, moderate LAE, trivial TR, RVSP 33 mmHg, dilated IVC.   Antimicrobials:  None    Subjective: - no chest pain, shortness of breath, no abdominal pain, nausea or vomiting.   Objective: Vitals:   05/03/18 0200 05/03/18 0400 05/03/18 0800 05/03/18 1200  BP: (!) 189/90 (!) 188/100 131/64 (!) 158/66  Pulse: 68 85 (!) 57 (!) 122  Resp: (!) 22 18 17  (!) 23  Temp:   97.8 F (36.6 C) 98.2 F (36.8 C)  TempSrc:      SpO2: 98% 97% 98% (!) 64%  Weight:      Height:        Intake/Output Summary (Last 24 hours) at 05/03/2018 1214 Last data filed at 05/03/2018 0700 Gross per 24 hour  Intake 396.38 ml  Output 1650 ml  Net -1253.62 ml   Filed Weights   05/02/18 0639  Weight: 110.6 kg    Examination:  Constitutional: NAD, confused this morning but able to answer basic questions ENMT: Mucous membranes are moist.  Respiratory: clear to auscultation bilaterally, no wheezing, no crackles. Normal respiratory effort. No accessory muscle use.  Cardiovascular: Irregular, no murmurs heard.  No peripheral edema. Abdomen: no tenderness. Bowel sounds positive.  Skin: no rashes, lesions, ulcers. No induration Neurologic: Slurred speech present and facial droop.  Left-sided weakness present 4/5  Data Reviewed: I have independently reviewed following labs and imaging studies   CBC: Recent Labs  Lab 05/01/18 2206  WBC 9.3  NEUTROABS 5.2  HGB 12.9  HCT 41.6  MCV 88.1  PLT 326   Basic Metabolic Panel: Recent Labs  Lab 05/01/18 2206  NA 141  K 4.5  CL 109  CO2 24  GLUCOSE 101*  BUN 16  CREATININE 0.98  CALCIUM 9.3   GFR: Estimated Creatinine Clearance: 48.1 mL/min (by C-G formula based on SCr of 0.98 mg/dL). Liver Function Tests: Recent Labs  Lab 05/01/18 2206  AST 21  ALT 12  ALKPHOS 38  BILITOT 0.7  PROT 6.2*  ALBUMIN 3.8    No results for input(s): LIPASE, AMYLASE in the last 168 hours. No results for input(s): AMMONIA in the last 168 hours. Coagulation Profile: Recent Labs  Lab 05/01/18 2206  INR 1.01   Cardiac Enzymes: No results for input(s): CKTOTAL, CKMB, CKMBINDEX, TROPONINI in the last 168 hours. BNP (last 3 results) No results for input(s): PROBNP in the last 8760 hours. HbA1C: Recent Labs    05/03/18 0452  HGBA1C 5.2   CBG: No results for input(s): GLUCAP in the last 168 hours. Lipid Profile: Recent Labs    05/03/18 0452  CHOL 141  HDL 48  LDLCALC 85  TRIG 39  CHOLHDL 2.9   Thyroid Function Tests: No results for input(s): TSH, T4TOTAL, FREET4, T3FREE, THYROIDAB in the last 72 hours. Anemia Panel: No results for input(s): VITAMINB12, FOLATE, FERRITIN, TIBC, IRON, RETICCTPCT in the last 72 hours. Urine analysis:    Component Value Date/Time   COLORURINE YELLOW 05/01/2018 2300   APPEARANCEUR CLEAR 05/01/2018 2300   LABSPEC 1.012 05/01/2018 2300   PHURINE 6.0 05/01/2018 2300   GLUCOSEU NEGATIVE 05/01/2018 2300   HGBUR NEGATIVE 05/01/2018 2300   BILIRUBINUR NEGATIVE 05/01/2018 2300   BILIRUBINUR Negative 11/29/2017 1605   KETONESUR NEGATIVE 05/01/2018 2300   PROTEINUR NEGATIVE 05/01/2018 2300   UROBILINOGEN 0.2 11/29/2017 1605   UROBILINOGEN 0.2 10/08/2009 1326   NITRITE NEGATIVE 05/01/2018 2300   LEUKOCYTESUR TRACE (A) 05/01/2018 2300   Sepsis Labs: Invalid input(s): PROCALCITONIN, LACTICIDVEN  Recent Results (from the past 240 hour(s))  MRSA PCR Screening     Status: None   Collection Time: 05/02/18  6:43 AM  Result Value Ref Range Status   MRSA by PCR NEGATIVE NEGATIVE Final    Comment:        The GeneXpert MRSA Assay (FDA approved for NASAL specimens only), is one component of a comprehensive MRSA colonization surveillance program. It is not intended to diagnose MRSA infection nor to guide or monitor treatment for MRSA infections. Performed at [facility], [place] [address], [place], [place] [address]       Radiology Studies: Ct Angio Head W Or Wo Contrast  Result Date: 05/02/2018 CLINICAL DATA:  84 y/o  F; left-sided weakness and slurred speech. EXAM: CT ANGIOGRAPHY HEAD AND NECK TECHNIQUE: Multidetector CT imaging of the head and neck was performed using the standard protocol during bolus administration of intravenous contrast. Multiplanar CT image reconstructions and MIPs were obtained to evaluate the vascular anatomy. Carotid stenosis measurements (when applicable) are obtained utilizing NASCET criteria, using the distal internal carotid diameter as the denominator. CONTRAST:  50 cc Isovue 370 COMPARISON:  05/01/2018 CT head FINDINGS: CTA NECK FINDINGS Aortic arch: 4.2 cm ascending aortic aneurysm. Standard branching. Mild calcific atherosclerosis of the aortic arch. Right carotid system: No evidence of dissection, stenosis (50% or greater) or occlusion. Left carotid system: No evidence of dissection, stenosis (50% or greater) or occlusion. Vertebral arteries: Left dominance. No evidence of dissection, stenosis (50%  or greater) or occlusion. Skeleton: C3-4 grade 1 retrolisthesis and C4-5 grade 1 anterolisthesis. Prominent bilateral upper cervical facet arthropathy. Discogenic degenerative changes with loss of disc space height at the C3-4 and C5-C7 levels. Other neck: 10 mm nodule within the thyroid left lobe. Upper chest: Negative. Review of the MIP images confirms the above findings CTA HEAD FINDINGS Anterior circulation: Calcific atherosclerosis of the carotid siphons with moderate right and mild left paraclinoid stenosis. Posterior circulation: No significant stenosis, proximal occlusion, aneurysm, or vascular malformation. Venous sinuses: As permitted by contrast timing, patent. Anatomic variants: None significant. Delayed phase: No abnormal intracranial enhancement. Review of the MIP images confirms the above findings IMPRESSION: 1. Patent  carotid and vertebral arteries. No dissection, aneurysm, or hemodynamically significant stenosis utilizing NASCET criteria. 2. Patent anterior and posterior intracranial circulation. No large vessel occlusion, aneurysm, or high-grade stenosis. 3. 4.2 cm ascending aortic aneurysm. Recommend annual imaging followup by CTA or MRA. This recommendation follows 2010 ACCF/AHA/AATS/ACR/ASA/SCA/SCAI/SIR/STS/SVM Guidelines for the Diagnosis and Management of Patients with Thoracic Aortic Disease. Circulation. 2010; 121: W102-V253. 4. Calcific atherosclerosis of the carotid siphons with moderate right and mild left paraclinoid stenosis. Electronically Signed   By: Kristine Garbe M.D.   On: 05/02/2018 03:30   Ct Head Wo Contrast  Result Date: 05/03/2018 CLINICAL DATA:  Followup stroke. Multiple small acute infarctions shown by MRI yesterday. EXAM: CT HEAD WITHOUT CONTRAST TECHNIQUE: Contiguous axial images were obtained from the base of the skull through the vertex without intravenous contrast. COMPARISON:  CT and MRI 05/02/2018 FINDINGS: Brain: No identifiable acute finding by CT. Mild age related atrophy. Chronic small-vessel ischemic changes of the hemispheric white matter as seen previously. No identifiable acute infarction, mass lesion, hemorrhage, hydrocephalus or extra-axial collection. Vascular: There is atherosclerotic calcification of the major vessels at the base of the brain. Skull: Negative Sinuses/Orbits: Clear/normal Other: None IMPRESSION: No acute infarction by CT. Atrophy and chronic small-vessel disease. Numerous small acute infarctions throughout the brain shown by MRI yesterday cannot be visualized by CT. No evidence of hemorrhagic complication or swelling. Electronically Signed   By: Nelson Chimes M.D.   On: 05/03/2018 11:48   Ct Head Wo Contrast  Result Date: 05/01/2018 CLINICAL DATA:  84 year old female with stroke-like symptoms. EXAM: CT HEAD WITHOUT CONTRAST TECHNIQUE: Contiguous axial  images were obtained from the base of the skull through the vertex without intravenous contrast. COMPARISON:  Head CT dated 06/15/2016 FINDINGS: Brain: There is mild age-related atrophy and chronic microvascular ischemic changes. A small area of old infarct and noted involving the anterior horn of the left internal capsule. There is no acute intracranial hemorrhage. No mass effect or midline shift. No extra-axial fluid collection. Vascular: No hyperdense vessel or unexpected calcification. Skull: Normal. Negative for fracture or focal lesion. Sinuses/Orbits: No acute finding. Other: None IMPRESSION: 1. No acute intracranial pathology. 2. Age-related atrophy and chronic microvascular ischemic changes. Electronically Signed   By: Anner Crete M.D.   On: 05/01/2018 23:26   Ct Angio Neck W Or Wo Contrast  Result Date: 05/02/2018 CLINICAL DATA:  84 y/o  F; left-sided weakness and slurred speech. EXAM: CT ANGIOGRAPHY HEAD AND NECK TECHNIQUE: Multidetector CT imaging of the head and neck was performed using the standard protocol during bolus administration of intravenous contrast. Multiplanar CT image reconstructions and MIPs were obtained to evaluate the vascular anatomy. Carotid stenosis measurements (when applicable) are obtained utilizing NASCET criteria, using the distal internal carotid diameter as the denominator. CONTRAST:  50 cc Isovue 370 COMPARISON:  05/01/2018 CT head FINDINGS: CTA NECK FINDINGS Aortic arch: 4.2 cm ascending aortic aneurysm. Standard branching. Mild calcific atherosclerosis of the aortic arch. Right carotid system: No evidence of dissection, stenosis (50% or greater) or occlusion. Left carotid system: No evidence of dissection, stenosis (50% or greater) or occlusion. Vertebral arteries: Left dominance. No evidence of dissection, stenosis (50% or greater) or occlusion. Skeleton: C3-4 grade 1 retrolisthesis and C4-5 grade 1 anterolisthesis. Prominent bilateral upper cervical facet  arthropathy. Discogenic degenerative changes with loss of disc space height at the C3-4 and C5-C7 levels. Other neck: 10 mm nodule within the thyroid left lobe. Upper chest: Negative. Review of the MIP images confirms the above findings CTA HEAD FINDINGS Anterior circulation: Calcific atherosclerosis of the carotid siphons with moderate right and mild left paraclinoid stenosis. Posterior circulation: No significant stenosis, proximal occlusion, aneurysm, or vascular malformation. Venous sinuses: As permitted by contrast timing, patent. Anatomic variants: None significant. Delayed phase: No abnormal intracranial enhancement. Review of the MIP images confirms the above findings IMPRESSION: 1. Patent carotid and vertebral arteries. No dissection, aneurysm, or hemodynamically significant stenosis utilizing NASCET criteria. 2. Patent anterior and posterior intracranial circulation. No large vessel occlusion, aneurysm, or high-grade stenosis. 3. 4.2 cm ascending aortic aneurysm. Recommend annual imaging followup by CTA or MRA. This recommendation follows 2010 ACCF/AHA/AATS/ACR/ASA/SCA/SCAI/SIR/STS/SVM Guidelines for the Diagnosis and Management of Patients with Thoracic Aortic Disease. Circulation. 2010; 121: I356-Y616. 4. Calcific atherosclerosis of the carotid siphons with moderate right and mild left paraclinoid stenosis. Electronically Signed   By: Kristine Garbe M.D.   On: 05/02/2018 03:30   Mr Brain Wo Contrast  Result Date: 05/02/2018 CLINICAL DATA:  84 y/o  F; stroke for follow-up. EXAM: MRI HEAD WITHOUT CONTRAST TECHNIQUE: Multiplanar, multiecho pulse sequences of the brain and surrounding structures were obtained without intravenous contrast. COMPARISON:  05/01/2018 CT head.  05/02/2018 CTA head. FINDINGS: Brain: Subcentimeter foci of reduced diffusion are present within the left posterior frontal subcortical white matter, right caudate body, right putamen, right caudate head, right hemi pons, and  bilateral cerebellar hemispheres compatible with acute/early subacute infarction. No associated hemorrhage or mass effect. Several nonspecific T2 FLAIR hyperintensities in subcortical and periventricular white matter are compatible with moderate chronic microvascular ischemic changes for age. Moderate volume loss of the brain. No extra-axial collection, hydrocephalus, or herniation. Small foci of susceptibility hypointensity are present in a predominantly central distribution within the basal ganglia compatible with hemosiderin deposition of chronic microhemorrhage. The central distribution favors hypertensive etiology. Vascular: Normal flow voids. Skull and upper cervical spine: Normal marrow signal. Sinuses/Orbits: Negative. Other: None. IMPRESSION: Subcentimeter foci of acute/early subacute infarction are present within the left posterior frontal subcortical white matter, right caudate body, right putamen, right caudate head, right hemi pons, and bilateral cerebellar hemispheres. No associated hemorrhage or mass effect. Multiple vascular territories probably represents embolic etiology. Electronically Signed   By: Kristine Garbe M.D.   On: 05/02/2018 04:16   Dg Swallowing Func-speech Pathology  Result Date: 05/02/2018 Objective Swallowing Evaluation: Type of Study: MBS-Modified Barium Swallow Study  Patient Details Name: Katie Silva MRN: [id number] Date of Birth: 10-20-1925 Today's Date: 05/02/2018 Time: SLP Start Time (ACUTE ONLY): 1510 -SLP Stop Time (ACUTE ONLY): 1530 SLP Time Calculation (min) (ACUTE ONLY): 20 min Past Medical History: Past Medical History: Diagnosis Date . Anemia  . Arthritis   hands, knees . Breast cancer, left breast ([place]) 2003  s/p mastectomy, chemo.  Dr. Chancy Milroy oncologist . Fracture of humerus, proximal, right, closed 07/2010  s/p  hemiarthroplasty . Gastric ulcer with hemorrhage 02/14/2013 . Helicobacter pylori (H. pylori) infection 02/15/2013  eradication confirmed 05/2013  UBT . Hypertension  . Multiple duodenal ulcers 02/14/2013 . Osteoporosis  . Stage 3 chronic kidney disease [facility])  Past Surgical History: Past Surgical History: Procedure Laterality Date . ABDOMINAL HYSTERECTOMY   . BREATH TEK H PYLORI N/A 06/06/2013  Procedure: BREATH TEK H PYLORI;  Surgeon: Gatha Mayer, MD;  Location: [facility] ENDOSCOPY;  Service: Endoscopy;  Laterality: N/A; . ESOPHAGOGASTRODUODENOSCOPY N/A 02/14/2013  Procedure: ESOPHAGOGASTRODUODENOSCOPY (EGD);  Surgeon: Gatha Mayer, MD;  Location: [facility];  Service: Endoscopy;  Laterality: N/A; . FEMUR IM NAIL  04/13/2012  Procedure: INTRAMEDULLARY (IM) NAIL FEMORAL;  Surgeon: Melina Schools, MD;  Location: [facility] ORS;  Service: Orthopedics;  Laterality: Left; . HIP SURGERY   . KNEE ARTHROSCOPY Bilateral   [facility] . MASTECTOMY Left  . SHOULDER HEMI-ARTHROPLASTY Right 07/2010  Dr. Mardelle Matte HPI: Katie Silva is a 84 y.o. female with medical history significant of dementia, HTN.  Patient presents to the ED with increased confusion, slurred speech, inability to ambulate, L>R sided weakness.  Some intermittent symptoms noticed a week ago. MRI reveals multiple acute embolic sub-cm strokes.  No data recorded Assessment / Plan / Recommendation CHL IP CLINICAL IMPRESSIONS 05/02/2018 Clinical Impression Pt demonstrates a mild oropharyngeal dysphagia with no overt weakness. Prominent cricopharyngeus observed with trace backflow to pyriforms in addition to mild entrapment of solids and liquids in the valleculae and pyriforms. This results in trace silent frank penetration which clears with spontanous and cued throat clearing. Suspect this is a baseline age related/primary cervical esophageal impairment. Recommend pt resumed a regular diet and thin liquids with additional aspiration precautions including cues to clear throat intermittently and to participate in consistent oral care. [name]  SLP Visit Diagnosis Frontal lobe and executive function deficit Attention and  concentration deficit following -- Frontal lobe and executive function deficit following Cerebral infarction Impact on safety and function Mild aspiration risk   CHL IP TREATMENT RECOMMENDATION 05/02/2018 Treatment Recommendations Therapy as outlined in treatment plan below   Prognosis 05/02/2018 Prognosis for Safe Diet Advancement Good Barriers to Reach Goals -- Barriers/Prognosis Comment -- CHL IP DIET RECOMMENDATION 05/02/2018 SLP Diet Recommendations Regular solids;Thin liquid Liquid Administration via Cup;Straw Medication Administration Whole meds with puree Compensations Slow rate;Small sips/bites;Clear throat intermittently Postural Changes Seated upright at 90 degrees   CHL IP OTHER RECOMMENDATIONS 05/02/2018 Recommended Consults -- Oral Care Recommendations Oral care BID Other Recommendations --   CHL IP FOLLOW UP RECOMMENDATIONS 05/02/2018 Follow up Recommendations Skilled Nursing facility   [facility] IP FREQUENCY AND DURATION 05/02/2018 Speech Therapy Frequency (ACUTE ONLY) min 2x/week Treatment Duration 1 week      CHL IP ORAL PHASE 05/02/2018 Oral Phase WFL Oral - Pudding Teaspoon -- Oral - Pudding Cup -- Oral - Honey Teaspoon -- Oral - Honey Cup -- Oral - Nectar Teaspoon -- Oral - Nectar Cup -- Oral - Nectar Straw -- Oral - Thin Teaspoon -- Oral - Thin Cup -- Oral - Thin Straw -- Oral - Puree -- Oral - Mech Soft -- Oral - Regular -- Oral - Multi-Consistency -- Oral - Pill -- Oral Phase - Comment --  CHL IP PHARYNGEAL PHASE 05/02/2018 Pharyngeal Phase Impaired Pharyngeal- Pudding Teaspoon -- Pharyngeal -- Pharyngeal- Pudding Cup -- Pharyngeal -- Pharyngeal- Honey Teaspoon -- Pharyngeal -- Pharyngeal- Honey Cup -- Pharyngeal -- Pharyngeal- Nectar Teaspoon -- Pharyngeal -- Pharyngeal- Nectar Cup -- Pharyngeal -- Pharyngeal- Nectar Straw --  Pharyngeal -- Pharyngeal- Thin Teaspoon -- Pharyngeal -- Pharyngeal- Thin Cup Pharyngeal residue - valleculae;Pharyngeal residue - pyriform;Penetration/Apiration after swallow  Pharyngeal Material enters airway, remains ABOVE vocal cords and not ejected out;Material enters airway, CONTACTS cords and then ejected out;Material does not enter airway Pharyngeal- Thin Straw Penetration/Apiration after swallow;Penetration/Aspiration during swallow;Pharyngeal residue - valleculae;Pharyngeal residue - pyriform;Pharyngeal residue - cp segment Pharyngeal Material enters airway, CONTACTS cords and not ejected out;Material enters airway, CONTACTS cords and then ejected out;Material does not enter airway Pharyngeal- Puree Pharyngeal residue - valleculae Pharyngeal -- Pharyngeal- Mechanical Soft -- Pharyngeal -- Pharyngeal- Regular Pharyngeal residue - valleculae Pharyngeal -- Pharyngeal- Multi-consistency -- Pharyngeal -- Pharyngeal- Pill -- Pharyngeal -- Pharyngeal Comment --  No flowsheet data found. Herbie [place], [place] CCC-SLP [phone] Othelia Pulling Katherene Ponto 05/02/2018, 3:48 PM                Scheduled Meds: .  stroke: mapping our early stages of recovery book   Does not apply Once  . apixaban  5 mg Oral BID  . pravastatin  20 mg Oral q1800  . traMADol  50 mg Oral Once   Continuous Infusions: . sodium chloride 50 mL/hr at 05/03/18 0300     Marzetta Board, MD, PhD [name] Pager [phone]  If 7PM-7AM, please contact night-coverage [email or web address] Password [facility] 05/03/2018, 12:14 PM

## 2018-05-03 NOTE — Progress Notes (Signed)
Inpatient Rehabilitation Admissions Coordinator  I met with patient's daughter, Jackelyn Poling, at bedside. We discussed goals and expectations of an inpt rehab admit. Family can provide 24/7 care at home and prefer inpt rehab rather than SNF. Pt sleeping at present. I will follow up tomorrow after more therapy progress before pursuing insurance approval for a possible inpt rehab admit.  Danne Baxter, RN, MSN Rehab Admissions Coordinator [phone] 05/03/2018 1:02 PM'

## 2018-05-03 NOTE — Discharge Instructions (Signed)

## 2018-05-03 NOTE — Progress Notes (Signed)
SLP Cancellation Note  Patient Details Name: HORTENCE CHARTER MRN: [id number] DOB: 29-Sep-1925   Cancelled treatment:       Reason Eval/Treat Not Completed: Fatigue/lethargy limiting ability to participate. Pt sleeping this am, son could not wake her up for meal. SLP reviewed MBS result and basic aspiration precautions including benefit of verbal cue for intermittent throat clear. Will f/u to observe son practicing recommendations.    Jessie Schrieber, Katherene Ponto 05/03/2018, 10:54 AM

## 2018-05-03 NOTE — Progress Notes (Addendum)
STROKE TEAM PROGRESS NOTE   INTERVAL HISTORY Great granddtr at bedside. Pt somnolent. Unable to waken. granddtr stated she had a bad night, they didn't get her to sleep until 4a. Gave her medication - confirmed total haldol 3.5 and ativan 1 mg given. Concern given addition of eliquis yesterday. Discussed with Erlinda Hong and will check CT head.  Vitals:   05/03/18 0000 05/03/18 0100 05/03/18 0200 05/03/18 0400  BP: (!) 179/85 (!) 174/63 (!) 189/90 (!) 188/100  Pulse: 69 (!) 55 68 85  Resp: 16 14 (!) 22 18  Temp:      TempSrc:      SpO2: 98% 98% 98% 97%  Weight:      Height:        CBC:  Recent Labs  Lab 05/01/18 2206  WBC 9.3  NEUTROABS 5.2  HGB 12.9  HCT 41.6  MCV 88.1  PLT 846    Basic Metabolic Panel:  Recent Labs  Lab 05/01/18 2206  NA 141  K 4.5  CL 109  CO2 24  GLUCOSE 101*  BUN 16  CREATININE 0.98  CALCIUM 9.3   Lipid Panel:     Component Value Date/Time   CHOL 141 05/03/2018 0452   TRIG 39 05/03/2018 0452   HDL 48 05/03/2018 0452   CHOLHDL 2.9 05/03/2018 0452   VLDL 8 05/03/2018 0452   LDLCALC 85 05/03/2018 0452   HgbA1c:  Lab Results  Component Value Date   HGBA1C 5.2 05/03/2018   Urine Drug Screen:     Component Value Date/Time   LABOPIA NONE DETECTED 05/01/2018 2345   COCAINSCRNUR NONE DETECTED 05/01/2018 2345   LABBENZ NONE DETECTED 05/01/2018 2345   AMPHETMU NONE DETECTED 05/01/2018 2345   THCU NONE DETECTED 05/01/2018 2345   LABBARB NONE DETECTED 05/01/2018 2345    Alcohol Level     Component Value Date/Time   ETH <10 05/02/2018 0042    IMAGING Ct Angio Head W Or Wo Contrast  Result Date: 05/02/2018 CLINICAL DATA:  84 y/o  F; left-sided weakness and slurred speech. EXAM: CT ANGIOGRAPHY HEAD AND NECK TECHNIQUE: Multidetector CT imaging of the head and neck was performed using the standard protocol during bolus administration of intravenous contrast. Multiplanar CT image reconstructions and MIPs were obtained to evaluate the vascular  anatomy. Carotid stenosis measurements (when applicable) are obtained utilizing NASCET criteria, using the distal internal carotid diameter as the denominator. CONTRAST:  50 cc Isovue 370 COMPARISON:  05/01/2018 CT head FINDINGS: CTA NECK FINDINGS Aortic arch: 4.2 cm ascending aortic aneurysm. Standard branching. Mild calcific atherosclerosis of the aortic arch. Right carotid system: No evidence of dissection, stenosis (50% or greater) or occlusion. Left carotid system: No evidence of dissection, stenosis (50% or greater) or occlusion. Vertebral arteries: Left dominance. No evidence of dissection, stenosis (50% or greater) or occlusion. Skeleton: C3-4 grade 1 retrolisthesis and C4-5 grade 1 anterolisthesis. Prominent bilateral upper cervical facet arthropathy. Discogenic degenerative changes with loss of disc space height at the C3-4 and C5-C7 levels. Other neck: 10 mm nodule within the thyroid left lobe. Upper chest: Negative. Review of the MIP images confirms the above findings CTA HEAD FINDINGS Anterior circulation: Calcific atherosclerosis of the carotid siphons with moderate right and mild left paraclinoid stenosis. Posterior circulation: No significant stenosis, proximal occlusion, aneurysm, or vascular malformation. Venous sinuses: As permitted by contrast timing, patent. Anatomic variants: None significant. Delayed phase: No abnormal intracranial enhancement. Review of the MIP images confirms the above findings IMPRESSION: 1. Patent carotid and vertebral arteries.  No dissection, aneurysm, or hemodynamically significant stenosis utilizing NASCET criteria. 2. Patent anterior and posterior intracranial circulation. No large vessel occlusion, aneurysm, or high-grade stenosis. 3. 4.2 cm ascending aortic aneurysm. Recommend annual imaging followup by CTA or MRA. This recommendation follows 2010 ACCF/AHA/AATS/ACR/ASA/SCA/SCAI/SIR/STS/SVM Guidelines for the Diagnosis and Management of Patients with Thoracic Aortic  Disease. Circulation. 2010; 121: K160-F093. 4. Calcific atherosclerosis of the carotid siphons with moderate right and mild left paraclinoid stenosis. Electronically Signed   By: Kristine Garbe M.D.   On: 05/02/2018 03:30   Ct Head Wo Contrast  Result Date: 05/03/2018 CLINICAL DATA:  Followup stroke. Multiple small acute infarctions shown by MRI yesterday. EXAM: CT HEAD WITHOUT CONTRAST TECHNIQUE: Contiguous axial images were obtained from the base of the skull through the vertex without intravenous contrast. COMPARISON:  CT and MRI 05/02/2018 FINDINGS: Brain: No identifiable acute finding by CT. Mild age related atrophy. Chronic small-vessel ischemic changes of the hemispheric white matter as seen previously. No identifiable acute infarction, mass lesion, hemorrhage, hydrocephalus or extra-axial collection. Vascular: There is atherosclerotic calcification of the major vessels at the base of the brain. Skull: Negative Sinuses/Orbits: Clear/normal Other: None IMPRESSION: No acute infarction by CT. Atrophy and chronic small-vessel disease. Numerous small acute infarctions throughout the brain shown by MRI yesterday cannot be visualized by CT. No evidence of hemorrhagic complication or swelling. Electronically Signed   By: Nelson Chimes M.D.   On: 05/03/2018 11:48   Ct Head Wo Contrast  Result Date: 05/01/2018 CLINICAL DATA:  84 year old female with stroke-like symptoms. EXAM: CT HEAD WITHOUT CONTRAST TECHNIQUE: Contiguous axial images were obtained from the base of the skull through the vertex without intravenous contrast. COMPARISON:  Head CT dated 06/15/2016 FINDINGS: Brain: There is mild age-related atrophy and chronic microvascular ischemic changes. A small area of old infarct and noted involving the anterior horn of the left internal capsule. There is no acute intracranial hemorrhage. No mass effect or midline shift. No extra-axial fluid collection. Vascular: No hyperdense vessel or unexpected  calcification. Skull: Normal. Negative for fracture or focal lesion. Sinuses/Orbits: No acute finding. Other: None IMPRESSION: 1. No acute intracranial pathology. 2. Age-related atrophy and chronic microvascular ischemic changes. Electronically Signed   By: Anner Crete M.D.   On: 05/01/2018 23:26   Ct Angio Neck W Or Wo Contrast  Result Date: 05/02/2018 CLINICAL DATA:  84 y/o  F; left-sided weakness and slurred speech. EXAM: CT ANGIOGRAPHY HEAD AND NECK TECHNIQUE: Multidetector CT imaging of the head and neck was performed using the standard protocol during bolus administration of intravenous contrast. Multiplanar CT image reconstructions and MIPs were obtained to evaluate the vascular anatomy. Carotid stenosis measurements (when applicable) are obtained utilizing NASCET criteria, using the distal internal carotid diameter as the denominator. CONTRAST:  50 cc Isovue 370 COMPARISON:  05/01/2018 CT head FINDINGS: CTA NECK FINDINGS Aortic arch: 4.2 cm ascending aortic aneurysm. Standard branching. Mild calcific atherosclerosis of the aortic arch. Right carotid system: No evidence of dissection, stenosis (50% or greater) or occlusion. Left carotid system: No evidence of dissection, stenosis (50% or greater) or occlusion. Vertebral arteries: Left dominance. No evidence of dissection, stenosis (50% or greater) or occlusion. Skeleton: C3-4 grade 1 retrolisthesis and C4-5 grade 1 anterolisthesis. Prominent bilateral upper cervical facet arthropathy. Discogenic degenerative changes with loss of disc space height at the C3-4 and C5-C7 levels. Other neck: 10 mm nodule within the thyroid left lobe. Upper chest: Negative. Review of the MIP images confirms the above findings CTA HEAD FINDINGS Anterior  circulation: Calcific atherosclerosis of the carotid siphons with moderate right and mild left paraclinoid stenosis. Posterior circulation: No significant stenosis, proximal occlusion, aneurysm, or vascular malformation.  Venous sinuses: As permitted by contrast timing, patent. Anatomic variants: None significant. Delayed phase: No abnormal intracranial enhancement. Review of the MIP images confirms the above findings IMPRESSION: 1. Patent carotid and vertebral arteries. No dissection, aneurysm, or hemodynamically significant stenosis utilizing NASCET criteria. 2. Patent anterior and posterior intracranial circulation. No large vessel occlusion, aneurysm, or high-grade stenosis. 3. 4.2 cm ascending aortic aneurysm. Recommend annual imaging followup by CTA or MRA. This recommendation follows 2010 ACCF/AHA/AATS/ACR/ASA/SCA/SCAI/SIR/STS/SVM Guidelines for the Diagnosis and Management of Patients with Thoracic Aortic Disease. Circulation. 2010; 121: X517-G017. 4. Calcific atherosclerosis of the carotid siphons with moderate right and mild left paraclinoid stenosis. Electronically Signed   By: Kristine Garbe M.D.   On: 05/02/2018 03:30   Mr Brain Wo Contrast  Result Date: 05/02/2018 CLINICAL DATA:  84 y/o  F; stroke for follow-up. EXAM: MRI HEAD WITHOUT CONTRAST TECHNIQUE: Multiplanar, multiecho pulse sequences of the brain and surrounding structures were obtained without intravenous contrast. COMPARISON:  05/01/2018 CT head.  05/02/2018 CTA head. FINDINGS: Brain: Subcentimeter foci of reduced diffusion are present within the left posterior frontal subcortical white matter, right caudate body, right putamen, right caudate head, right hemi pons, and bilateral cerebellar hemispheres compatible with acute/early subacute infarction. No associated hemorrhage or mass effect. Several nonspecific T2 FLAIR hyperintensities in subcortical and periventricular white matter are compatible with moderate chronic microvascular ischemic changes for age. Moderate volume loss of the brain. No extra-axial collection, hydrocephalus, or herniation. Small foci of susceptibility hypointensity are present in a predominantly central distribution  within the basal ganglia compatible with hemosiderin deposition of chronic microhemorrhage. The central distribution favors hypertensive etiology. Vascular: Normal flow voids. Skull and upper cervical spine: Normal marrow signal. Sinuses/Orbits: Negative. Other: None. IMPRESSION: Subcentimeter foci of acute/early subacute infarction are present within the left posterior frontal subcortical white matter, right caudate body, right putamen, right caudate head, right hemi pons, and bilateral cerebellar hemispheres. No associated hemorrhage or mass effect. Multiple vascular territories probably represents embolic etiology. Electronically Signed   By: Kristine Garbe M.D.   On: 05/02/2018 04:16   Dg Swallowing Func-speech Pathology  Result Date: 05/02/2018 Objective Swallowing Evaluation: Type of Study: MBS-Modified Barium Swallow Study  Patient Details Name: LOYOLA SANTINO MRN: [id number] Date of Birth: [birth year] Today's Date: 05/02/2018 Time: SLP Start Time (ACUTE ONLY): 1510 -SLP Stop Time (ACUTE ONLY): 1530 SLP Time Calculation (min) (ACUTE ONLY): 20 min Past Medical History: Past Medical History: Diagnosis Date . Anemia  . Arthritis   hands, knees . Breast cancer, left breast ([place]) 2003  s/p mastectomy, chemo.  Dr. Chancy Milroy oncologist . Fracture of humerus, proximal, right, closed 07/2010  s/p hemiarthroplasty . Gastric ulcer with hemorrhage 02/14/2013 . Helicobacter pylori (H. pylori) infection 02/15/2013  eradication confirmed 05/2013 [facility] . Hypertension  . Multiple duodenal ulcers 02/14/2013 . Osteoporosis  . Stage 3 chronic kidney disease [facility])  Past Surgical History: Past Surgical History: Procedure Laterality Date . ABDOMINAL HYSTERECTOMY   . BREATH TEK H PYLORI N/A 06/06/2013  Procedure: BREATH TEK H PYLORI;  Surgeon: Gatha Mayer, MD;  Location: [facility] ENDOSCOPY;  Service: Endoscopy;  Laterality: N/A; . ESOPHAGOGASTRODUODENOSCOPY N/A 02/14/2013  Procedure: ESOPHAGOGASTRODUODENOSCOPY (EGD);  Surgeon: Gatha Mayer, MD;  Location: [facility];  Service: Endoscopy;  Laterality: N/A; . FEMUR IM NAIL  04/13/2012  Procedure: INTRAMEDULLARY (IM) NAIL FEMORAL;  Surgeon:  Melina Schools, MD;  Location: [facility] ORS;  Service: Orthopedics;  Laterality: Left; . HIP SURGERY   . KNEE ARTHROSCOPY Bilateral   [facility] . MASTECTOMY Left  . SHOULDER HEMI-ARTHROPLASTY Right 07/2010  Dr. Mardelle Matte HPI: ETTA GASSETT is a 84 y.o. female with medical history significant of dementia, HTN.  Patient presents to the ED with increased confusion, slurred speech, inability to ambulate, L>R sided weakness.  Some intermittent symptoms noticed a week ago. MRI reveals multiple acute embolic sub-cm strokes.  No data recorded Assessment / Plan / Recommendation CHL IP CLINICAL IMPRESSIONS 05/02/2018 Clinical Impression Pt demonstrates a mild oropharyngeal dysphagia with no overt weakness. Prominent cricopharyngeus observed with trace backflow to pyriforms in addition to mild entrapment of solids and liquids in the valleculae and pyriforms. This results in trace silent frank penetration which clears with spontanous and cued throat clearing. Suspect this is a baseline age related/primary cervical esophageal impairment. Recommend pt resumed a regular diet and thin liquids with additional aspiration precautions including cues to clear throat intermittently and to participate in consistent oral care. [name]  SLP Visit Diagnosis Frontal lobe and executive function deficit Attention and concentration deficit following -- Frontal lobe and executive function deficit following Cerebral infarction Impact on safety and function Mild aspiration risk   CHL IP TREATMENT RECOMMENDATION 05/02/2018 Treatment Recommendations Therapy as outlined in treatment plan below   Prognosis 05/02/2018 Prognosis for Safe Diet Advancement Good Barriers to Reach Goals -- Barriers/Prognosis Comment -- CHL IP DIET RECOMMENDATION 05/02/2018 SLP Diet Recommendations Regular solids;Thin  liquid Liquid Administration via Cup;Straw Medication Administration Whole meds with puree Compensations Slow rate;Small sips/bites;Clear throat intermittently Postural Changes Seated upright at 90 degrees   CHL IP OTHER RECOMMENDATIONS 05/02/2018 Recommended Consults -- Oral Care Recommendations Oral care BID Other Recommendations --   CHL IP FOLLOW UP RECOMMENDATIONS 05/02/2018 Follow up Recommendations Skilled Nursing facility   [facility] IP FREQUENCY AND DURATION 05/02/2018 Speech Therapy Frequency (ACUTE ONLY) min 2x/week Treatment Duration 1 week      CHL IP ORAL PHASE 05/02/2018 Oral Phase WFL Oral - Pudding Teaspoon -- Oral - Pudding Cup -- Oral - Honey Teaspoon -- Oral - Honey Cup -- Oral - Nectar Teaspoon -- Oral - Nectar Cup -- Oral - Nectar Straw -- Oral - Thin Teaspoon -- Oral - Thin Cup -- Oral - Thin Straw -- Oral - Puree -- Oral - Mech Soft -- Oral - Regular -- Oral - Multi-Consistency -- Oral - Pill -- Oral Phase - Comment --  CHL IP PHARYNGEAL PHASE 05/02/2018 Pharyngeal Phase Impaired Pharyngeal- Pudding Teaspoon -- Pharyngeal -- Pharyngeal- Pudding Cup -- Pharyngeal -- Pharyngeal- Honey Teaspoon -- Pharyngeal -- Pharyngeal- Honey Cup -- Pharyngeal -- Pharyngeal- Nectar Teaspoon -- Pharyngeal -- Pharyngeal- Nectar Cup -- Pharyngeal -- Pharyngeal- Nectar Straw -- Pharyngeal -- Pharyngeal- Thin Teaspoon -- Pharyngeal -- Pharyngeal- Thin Cup Pharyngeal residue - valleculae;Pharyngeal residue - pyriform;Penetration/Apiration after swallow Pharyngeal Material enters airway, remains ABOVE vocal cords and not ejected out;Material enters airway, CONTACTS cords and then ejected out;Material does not enter airway Pharyngeal- Thin Straw Penetration/Apiration after swallow;Penetration/Aspiration during swallow;Pharyngeal residue - valleculae;Pharyngeal residue - pyriform;Pharyngeal residue - cp segment Pharyngeal Material enters airway, CONTACTS cords and not ejected out;Material enters airway, CONTACTS cords and then  ejected out;Material does not enter airway Pharyngeal- Puree Pharyngeal residue - valleculae Pharyngeal -- Pharyngeal- Mechanical Soft -- Pharyngeal -- Pharyngeal- Regular Pharyngeal residue - valleculae Pharyngeal -- Pharyngeal- Multi-consistency -- Pharyngeal -- Pharyngeal- Pill -- Pharyngeal -- Pharyngeal Comment --  No flowsheet data found. Herbie [place], [place] CCC-SLP [phone] Lynann Beaver 05/02/2018, 3:48 PM              2D Echocardiogram  - Left ventricle: The cavity size was normal. Wall thickness was increased in a pattern of moderate LVH. Systolic function was normal. The estimated ejection fraction was in the range of 60% to 65%. Wall motion was normal; there were no regional wall motion abnormalities. Doppler parameters are consistent with abnormal left ventricular relaxation (grade 1 diastolic dysfunction). The E/e&' ratio is >15, suggesting elevated LV filling pressure. - Aortic valve: Sclerosis without stenosis. There was moderate regurgitation. - Mitral valve: Calcified annulus. Moderate thickening with mild end-systolic prolapse. Trivial regurgitation. - Left atrium: Moderately dilated. - Tricuspid valve: There was trivial regurgitation. - Pulmonary arteries: PA peak pressure: 33 mm Hg (S). - Inferior vena cava: The vessel was dilated. The respirophasic diameter changes were blunted (< 50%), consistent with elevated central venous pressure. Impressions:   LVEF 60-65%, moderate LVH, normal wall motion, grade 1 DD, elevated LV filling pressure, aortic sclerosis with moderate AI, heavy MAC with end-systolic bileaflet prolapse and trivial MR, moderate LAE, trivial TR, RVSP 33 mmHg, dilated IVC.  PHYSICAL EXAM Constitutional: small lady, but well-developed and well-nourished.  Eyes: No scleral injection HENT: No OP obstrucion Head: Normocephalic.  Cardiovascular: Normal rate and regular rhythm.  Respiratory: Effort normal, snoring breathing Skin: WDI  Neuro: Mental  Status: Patient is somnolent, unable to waken to pain or stimulation though she is resistant to movement and eyes being opened. No speech.  Cranial Nerves: II: Pupils are small but equal, round, and reactive to light.  III,IV, VI: eyes midline, unable to rotate head d/t resistance.  VII: mild L Facial weakness. No speech today Motor: Tone is increased all extremities and with forced with all attempted motor movements. She withdraws all extremities to pain.  Sensory: Sensation is intact to pain in the arms and legs. Cerebellar: Unable to assess  ASSESSMENT/PLAN Ms. [name] is a 84 y.o. female with history of dementia, HTN, gastric & duodenal ulcer s/ hmg, breast cancer presenting with L HP, confusion, slurred speech and L hip pain.   Stroke:  bilateral cerebellar, R pontine, R BG, R caudate head, R CR, L MCA/ACA watershed infarcts, all embolic secondary to newly dx AFib  CT head No acute stroke. Small vessel disease. Atrophy.   CTA head & neck 4.2 ascending aortic aneurysm. atherosclerosis   MRI  subcentimeter infarct L post frontal, R caudate, R putamen, R pons, B cerebellar infarcts  Repeat CT 8/27 following sedation without change, no hmg  2D Echo  EF 60-65%. No source of embolus.   LDL 85  HgbA1c 5.2  Eliquis for VTE prophylaxis  No antithrombotic prior to admission, now on Eliquis (apixaban) daily bid  Therapy recommendations:  CIR. Consult in place  Disposition:  Pending (see interval hx above)   Ok for transfer to the floor from stroke standpoint  Atrial Fibrillation, new onset  Home anticoagulation:  none   CHA2DS2-VASc Score = at least 6, ?2 oral anticoagulation recommended  Age in Years:  ?73   +2    Sex:  Female   Female   +1    Hypertension History:  yes   +1     Diabetes Mellitus:  0  Congestive Heart Failure History:  0  Vascular Disease History:  0     Stroke/TIA/Thromboembolism History:  yes   +2 . Continue Eliquis (apixaban) daily  Aortic Aneurysm  Seen on CTA neck  Annual follow up recommended  Hypertension  Stable . Permissive hypertension (OK if < 220/120) but gradually normalize in 5-7 days . Long-term BP goal normotensive  Hyperlipidemia  Home meds:  No statin  LDL 85, goal < 70  Now on pravachol 20. Continue at d/c  Other Stroke Risk Factors  Advanced age  Obesity, Body mass index is 39.35 kg/m., recommend weight loss, diet and exercise as appropriate   UDS / ETOH level negative   Other Active Problems  Baseline dementia with agitation and behavioral disturbance during the night. [place], posey on. Treated with Haldol 3.5 and ativan 1 mg. Just to sleep at 4a. Unable to waken this am. Monitor for resolution.  Hx breast ca  Hx gastric/duodenal ulcers w/ hmg  Osteoporosis   Hospital day # 1  Burnetta Sabin, MSN, APRN, ANVP-BC, AGPCNP-BC Advanced Practice Stroke Nurse [place] for Schedule & Pager information 05/03/2018 8:21 AM   ATTENDING NOTE: I reviewed above note and agree with the assessment and plan. I have made any additions or clarifications directly to the above note. Pt was seen and examined.   Patient was drowsy sleepy on examination this morning.  Of note, she received 2 doses of Haldol and 1 dose of Ativan overnight.  However, given recent initiation of Eliquis, repeated CT head to rule out hemorrhage.  CT stat head showed no hemorrhage.  On later rounding with the patient, patient lethargic but awake alert, orientated to self and people, not orientated to place and time.  Moving all extremities and no significant focal deficit.  Patient sleepy most likely due to medication effect.  Continue Eliquis and low-dose pravastatin for stroke prevention.  PT/OT.  May consider Seroquel for delirium or sundowning, try to avoid Ativan or Haldol if possible.  Neurology will sign off. Please call with questions. Pt will follow up with stroke clinic NP at [facility] in  about 4 weeks. Thanks for the consult.   Rosalin Hawking, MD PhD Stroke Neurology 05/03/2018 5:14 PM     To contact Stroke Continuity provider, please refer to [email or web address] After hours, contact General Neurology

## 2018-05-03 NOTE — Evaluation (Signed)
Occupational Therapy Evaluation Patient Details Name: Katie Silva MRN: [id number] DOB: Jul 01, 1926 Today's Date: 05/03/2018    History of Present Illness Patient is a 84 y/o female who presents with left sided weakness, gait instability and slurred speech. Brain MRI- multiple acute embolic infarcts ( L frontal, R caudate, R putman, R pons, Bil Cerebellar hemispheres). Found to have new onset A-fib in ED. PMH includes dementia, RA, HTN, breast ca.    Clinical Impression   PT admitted with multiple acute embolic infarcts. Pt currently with functional limitiations due to the deficits listed below (see OT problem list). Pt at baseline requires (A) for adls. Pt with R gaze preference and decr sitting balance affecting all adls.  Pt will benefit from skilled OT to increase their independence and safety with adls and balance to allow discharge CIR.     Follow Up Recommendations  CIR    Equipment Recommendations  3 in 1 bedside commode;Hospital bed    Recommendations for Other Services Rehab consult     Precautions / Restrictions Precautions Precautions: Fall      Mobility Bed Mobility Overal bed mobility: Needs Assistance Bed Mobility: Supine to Sit;Sit to Supine     Supine to sit: Mod assist;+2 for physical assistance;+2 for safety/equipment     General bed mobility comments: cues for direction and then truncal assist to come up via R elbow.  Pt too lethargic to participate well.  Transfers Overall transfer level: Needs assistance Equipment used: Rolling walker (2 wheeled) Transfers: Sit to/from [facility] Sit to Stand: Mod assist;+2 physical assistance;+2 safety/equipment Stand pivot transfers: Mod assist;+2 physical assistance;+2 safety/equipment       General transfer comment: able to stand fully upright, but needed assist to help get her R LE placed up under her appropriately.  Pt needed truncal, AD and pivotal assist for short pivot to the  chair.    Balance Overall balance assessment: Needs assistance;History of Falls Sitting-balance support: Feet supported;Single extremity supported Sitting balance-Leahy Scale: Poor Sitting balance - Comments: posterior lean in general with ability to facilitate pt forward for periods of time. Postural control: Posterior lean   Standing balance-Leahy Scale: Poor Standing balance comment: needed significant stability assist and/or use of AD                           ADL either performed or assessed with clinical judgement   ADL Overall ADL's : Needs assistance/impaired   Eating/Feeding Details (indicate cue type and reason): pt currently with breakfast present and not appropriate time to try PO intake due to lethargic Grooming: Moderate assistance Grooming Details (indicate cue type and reason): pt wiping mouth with cues. lacks awareness to drooling Upper Body Bathing: Maximal assistance   Lower Body Bathing: Maximal assistance   Upper Body Dressing : Maximal assistance   Lower Body Dressing: Maximal assistance   Toilet Transfer: Moderate assistance;+2 for physical assistance;+2 for safety/equipment             General ADL Comments: pt completed OOB to chair and then RN reports pt with pending scan and need to return to supine. pt returned to bed level     Vision   Vision Assessment?: Vision impaired- to be further tested in functional context;Yes Alignment/Gaze Preference: Gaze right Additional Comments: pt ablet o scan to the Left with cues and holding R gaze preference      Perception     Praxis      Pertinent  Vitals/Pain       Hand Dominance Left   Extremity/Trunk Assessment Upper Extremity Assessment Upper Extremity Assessment: Generalized weakness LUE Deficits / Details: weak grip   Lower Extremity Assessment Lower Extremity Assessment: Defer to PT evaluation   Cervical / Trunk Assessment Cervical / Trunk Assessment: Kyphotic    Communication Communication Communication: Expressive difficulties   Cognition Arousal/Alertness: Lethargic Behavior During Therapy: Flat affect Overall Cognitive Status: History of cognitive impairments - at baseline Area of Impairment: Orientation;Memory;Following commands;Awareness;Problem solving                 Orientation Level: Disoriented to;Situation;Time   Memory: Decreased short-term memory Following Commands: Follows one step commands with increased time   Awareness: Intellectual Problem Solving: Requires verbal cues General Comments: Hx of dementia. Not able to state year of birth but able to state month/date. Some left inattention noted.    General Comments       Exercises     Shoulder Instructions      Home Living Family/patient expects to be discharged to:: Private residence Living Arrangements: Alone Available Help at Discharge: Available 24 hours/day;Family Type of Home: House Home Access: Stairs to enter;Ramped entrance Entrance Stairs-Number of Steps: 5 Entrance Stairs-Rails: Right Home Layout: One level     Bathroom Shower/Tub: Tub/shower unit;Walk-in shower         Home Equipment: Gilford Rile - 2 wheels;Cane - single point;Bedside commode;Shower seat   Additional Comments: attends adult daycare during the day and family (A) in the evenings  Lives With: Alone    Prior Functioning/Environment Level of Independence: Needs assistance  Gait / Transfers Assistance Needed: Uses SPC for ambulation. No falls in last year per daughter.  ADL's / Homemaking Assistance Needed: Daughters assist with bathing.    Comments: Goes to daycare daily.         OT Problem List: Decreased strength;Decreased range of motion;Impaired balance (sitting and/or standing);Decreased activity tolerance;Impaired vision/perception;Decreased cognition;Decreased coordination;Decreased safety awareness;Decreased knowledge of use of DME or AE;Decreased knowledge of  precautions;Cardiopulmonary status limiting activity;Impaired UE functional use      OT Treatment/Interventions: Self-care/ADL training;Therapeutic exercise;Neuromuscular education;Energy conservation;DME and/or AE instruction;Manual therapy;Therapeutic activities;Cognitive remediation/compensation;Patient/family education;Balance training;Visual/perceptual remediation/compensation    OT Goals(Current goals can be found in the care plan section) Acute Rehab OT Goals Patient Stated Goal: none stated - pt states "i am a sleepy head" OT Goal Formulation: Patient unable to participate in goal setting Time For Goal Achievement: 05/17/18 Potential to Achieve Goals: Good  OT Frequency: Min 3X/week   Barriers to D/C:            Co-evaluation              AM-PAC PT "6 Clicks" Daily Activity     Outcome Measure Help from another person eating meals?: A Little Help from another person taking care of personal grooming?: A Lot Help from another person toileting, which includes using toliet, bedpan, or urinal?: A Lot Help from another person bathing (including washing, rinsing, drying)?: A Lot Help from another person to put on and taking off regular upper body clothing?: A Lot Help from another person to put on and taking off regular lower body clothing?: A Lot 6 Click Score: 13   End of Session Equipment Utilized During Treatment: Gait belt;Rolling walker Nurse Communication: Mobility status;Precautions  Activity Tolerance: Patient limited by lethargy Patient left: in bed;with call bell/phone within reach;with bed alarm set;with family/visitor present;with nursing/sitter in room  OT Visit Diagnosis: Unsteadiness on feet (R26.81);Muscle weakness (generalized) (  M62.81)                Time: 1032-1100 OT Time Calculation (min): 28 min Charges:  OT General Charges $OT Visit: 1 Visit OT Evaluation $OT Eval Moderate Complexity: 1 Mod   Jeri Modena   OTR/L Pager: [phone] Office:  [phone] .   Parke Poisson B 05/03/2018, 4:01 PM

## 2018-05-03 NOTE — Care Management Note (Signed)
Case Management Note  Patient Details  Name: Katie Silva MRN: [id number] Date of Birth: [birth year]  Subjective/Objective:  Patient is a 84 y/o female who presents with left sided weakness, gait instability and slurred speech. Brain MRI- multiple acute embolic infarcts.  PTA, pt required assistance with ADLS.  She goes to adult day care during the day; family stays with her at night.                  Action/Plan: PT/OT recommending CIR, and consult ordered.  Family able to provide assistance at dc; CIR following for possible admission pending progress with therapies.  Expected Discharge Date:                  Expected Discharge Plan:  [place]  In-House Referral:     Discharge planning Services  CM Consult  Post Acute Care Choice:    Choice offered to:     DME Arranged:    DME Agency:     HH Arranged:    [place] Agency:     Status of Service:  In process, will continue to follow  If discussed at Long Length of Stay Meetings, dates discussed:    Additional Comments:  Reinaldo Raddle, RN, BSN  Trauma/Neuro ICU Case Manager [phone]

## 2018-05-03 NOTE — Progress Notes (Signed)
Physical Therapy Treatment Patient Details Name: Katie Silva MRN: [id number] DOB: 1926-08-30 Today's Date: 05/03/2018    History of Present Illness Patient is a 84 y/o female who presents with left sided weakness, gait instability and slurred speech. Brain MRI- multiple acute embolic infarcts ( L frontal, R caudate, R putman, R pons, Bil Cerebellar hemispheres). Found to have new onset A-fib in ED. PMH includes dementia, RA, HTN, breast ca.     PT Comments    Pt was lethargic and needed consistent and repetitive cuing, but despite that participated as well as she could. Emphasis on warm up LE exercise, transition to EOB, sitting balance at EOB, standing and transfers with AD to chair.    Follow Up Recommendations  CIR     Equipment Recommendations  None recommended by PT    Recommendations for Other Services Rehab consult     Precautions / Restrictions Precautions Precautions: Fall    Mobility  Bed Mobility Overal bed mobility: Needs Assistance Bed Mobility: Supine to Sit;Sit to Supine     Supine to sit: Mod assist;+2 for physical assistance;+2 for safety/equipment     General bed mobility comments: cues for direction and then truncal assist to come up via R elbow.  Pt too lethargic to participate well.  Transfers Overall transfer level: Needs assistance Equipment used: Rolling walker (2 wheeled) Transfers: Sit to/from [facility] Sit to Stand: Mod assist;+2 physical assistance;+2 safety/equipment Stand pivot transfers: Mod assist;+2 physical assistance;+2 safety/equipment       General transfer comment: able to stand fully upright, but needed assist to help get her R LE placed up under her appropriately.  Pt needed truncal, AD and pivotal assist for short pivot to the chair.  Ambulation/Gait             General Gait Details: Unable this session.   Stairs             Wheelchair Mobility    Modified Rankin (Stroke Patients  Only) Modified Rankin (Stroke Patients Only) Pre-Morbid Rankin Score: Moderate disability Modified Rankin: Moderately severe disability     Balance Overall balance assessment: Needs assistance;History of Falls Sitting-balance support: Feet supported;Single extremity supported Sitting balance-Leahy Scale: Poor Sitting balance - Comments: posterior lean in general with ability to facilitate pt forward for periods of time. Postural control: Posterior lean   Standing balance-Leahy Scale: Poor Standing balance comment: needed significant stability assist and/or use of AD                            Cognition Arousal/Alertness: Awake/alert Behavior During Therapy: WFL for tasks assessed/performed Overall Cognitive Status: Difficult to assess                                        Exercises      General Comments        Pertinent Vitals/Pain Pain Assessment: Faces Faces Pain Scale: No hurt    Home Living Family/patient expects to be discharged to:: Private residence Living Arrangements: Alone Available Help at Discharge: Available 24 hours/day;Family Type of Home: House Home Access: Stairs to enter;Ramped entrance Entrance Stairs-Rails: Right Home Layout: One level Home Equipment: Walker - 2 wheels;Cane - single point;Bedside commode;Shower seat Additional Comments: attends adult daycare during the day and family (A) in the evenings    Prior Function Level of Independence: Needs assistance  Gait / Transfers Assistance Needed: Uses SPC for ambulation. No falls in last year per daughter.  ADL's / Homemaking Assistance Needed: Daughters assist with bathing.  Comments: Goes to daycare daily.    PT Goals (current goals can now be found in the care plan section) Acute Rehab PT Goals Patient Stated Goal: to get better PT Goal Formulation: With patient/family Time For Goal Achievement: 05/16/18 Potential to Achieve Goals: Good Progress towards PT  goals: Progressing toward goals    Frequency    Min 4X/week      PT Plan Current plan remains appropriate    Co-evaluation              AM-PAC PT "6 Clicks" Daily Activity  Outcome Measure  Difficulty turning over in bed (including adjusting bedclothes, sheets and blankets)?: Unable Difficulty moving from lying on back to sitting on the side of the bed? : Unable Difficulty sitting down on and standing up from a chair with arms (e.g., wheelchair, bedside commode, etc,.)?: Unable Help needed moving to and from a bed to chair (including a wheelchair)?: A Lot Help needed walking in hospital room?: Total Help needed climbing 3-5 steps with a railing? : Total 6 Click Score: 7    End of Session Equipment Utilized During Treatment: Gait belt Activity Tolerance: Patient limited by lethargy Patient left: in chair;with call bell/phone within reach;with family/visitor present Nurse Communication: Mobility status PT Visit Diagnosis: Hemiplegia and hemiparesis;Difficulty in walking, not elsewhere classified (R26.2);Muscle weakness (generalized) (M62.81) Hemiplegia - Right/Left: Left Hemiplegia - dominant/non-dominant: Dominant Hemiplegia - caused by: Cerebral infarction     Time: 1032-1100 PT Time Calculation (min) (ACUTE ONLY): 28 min  Charges:  $Therapeutic Activity: 8-22 mins                     05/03/2018  Donnella Sham, PT [phone]  (pager)   Katie Silva 05/03/2018, 11:43 AM

## 2018-05-04 LAB — BASIC METABOLIC PANEL
Anion gap: 5 (ref 5–15)
BUN: 15 mg/dL (ref 8–23)
CHLORIDE: 111 mmol/L (ref 98–111)
CO2: 24 mmol/L (ref 22–32)
CREATININE: 0.95 mg/dL (ref 0.44–1.00)
Calcium: 8.7 mg/dL — ABNORMAL LOW (ref 8.9–10.3)
GFR calc Af Amer: 59 mL/min — ABNORMAL LOW (ref >60)
GFR calc non Af Amer: 51 mL/min — ABNORMAL LOW (ref >60)
Glucose, Bld: 89 mg/dL (ref 70–99)
Potassium: 4.1 mmol/L (ref 3.5–5.1)
Sodium: 140 mmol/L (ref 135–145)

## 2018-05-04 LAB — CBC
HCT: 40.7 % (ref 36.0–46.0)
HEMOGLOBIN: 12.8 g/dL (ref 12.0–15.0)
MCH: 27.2 pg (ref 26.0–34.0)
MCHC: 31.4 g/dL (ref 30.0–36.0)
MCV: 86.4 fL (ref 78.0–100.0)
Platelets: 221 10*3/uL (ref 150–400)
RBC: 4.71 MIL/uL (ref 3.87–5.11)
RDW: 14.7 % (ref 11.5–15.5)
WBC: 8.6 10*3/uL (ref 4.0–10.5)

## 2018-05-04 MED ORDER — LORAZEPAM 2 MG/ML IJ SOLN
0.5000 mg | Freq: Once | INTRAMUSCULAR | Status: AC
  Administered 2018-05-04: 0.5 mg via INTRAVENOUS
  Filled 2018-05-04: qty 1

## 2018-05-04 NOTE — Progress Notes (Signed)
Took over patient care at 00:30 from off going nurse, Risk analyst. No changes from previous assessments. Patient is resting quietly in the bed with no distress with daughter at the bedside. Bed alarm is on and patient has continuous cardiac monitoring. Will continue to monitor.

## 2018-05-04 NOTE — Progress Notes (Signed)
Physical Therapy Treatment Patient Details Name: Katie Silva MRN: [id number] DOB: 1925-09-19 Today's Date: 05/04/2018    History of Present Illness Patient is a 84 y/o female who presents with left sided weakness, gait instability and slurred speech. Brain MRI- multiple acute embolic infarcts ( L frontal, R caudate, R putman, R pons, Bil Cerebellar hemispheres). Found to have new onset A-fib in ED. PMH includes dementia, RA, HTN, breast ca.     PT Comments    Much more alert and participative.  Following direction at a therapeutic level.  Emphasis on isolated ROM exercise, rolling, transition to sit, sitting balance, standing balance in the RW and pivotal transfers.   Follow Up Recommendations  CIR     Equipment Recommendations  None recommended by PT    Recommendations for Other Services       Precautions / Restrictions Precautions Precautions: Fall    Mobility  Bed Mobility Overal bed mobility: Needs Assistance Bed Mobility: Supine to Sit     Supine to sit: +2 for physical assistance;Mod assist;Max assist     General bed mobility comments: pt assisted in bridging to EOB, cued for sequencing and assisted up and forward via L elbow.  Scooted to EOB symetrically with truncal assist and hand over hand of left UE  Transfers Overall transfer level: Needs assistance Equipment used: Rolling walker (2 wheeled) Transfers: Sit to/from [facility] Sit to Stand: Mod assist;+2 physical assistance;+2 safety/equipment Stand pivot transfers: Mod assist;+2 physical assistance;+2 safety/equipment       General transfer comment: Stood up in RW.  Worked on upright stance with L knee extension and pivoted over 4 feet forward pivot and backed up.  pt needed stability assist and maneuvering the RW  Ambulation/Gait                 Stairs             Wheelchair Mobility    Modified Rankin (Stroke Patients Only) Modified Rankin (Stroke Patients  Only) Pre-Morbid Rankin Score: Moderate disability Modified Rankin: Moderately severe disability     Balance Overall balance assessment: Needs assistance;History of Falls Sitting-balance support: Feet supported;Single extremity supported Sitting balance-Leahy Scale: Poor Sitting balance - Comments: worked on sitting upright, but still listing posteriorly     Standing balance-Leahy Scale: Poor Standing balance comment: needed significant stability assist and/or use of AD                            Cognition Arousal/Alertness: Awake/alert Behavior During Therapy: Flat affect Overall Cognitive Status: History of cognitive impairments - at baseline Area of Impairment: Attention                   Current Attention Level: Sustained Memory: Decreased short-term memory Following Commands: Follows one step commands inconsistently;Follows one step commands with increased time   Awareness: Intellectual Problem Solving: Slow processing        Exercises      General Comments        Pertinent Vitals/Pain Pain Assessment: No/denies pain    Home Living                      Prior Function            PT Goals (current goals can now be found in the care plan section) Acute Rehab PT Goals Patient Stated Goal: I'm ready to get up. PT Goal Formulation: With patient/family  Time For Goal Achievement: 05/16/18 Potential to Achieve Goals: Good Progress towards PT goals: Progressing toward goals    Frequency    Min 4X/week      PT Plan Current plan remains appropriate    Co-evaluation              AM-PAC PT "6 Clicks" Daily Activity  Outcome Measure  Difficulty turning over in bed (including adjusting bedclothes, sheets and blankets)?: Unable Difficulty moving from lying on back to sitting on the side of the bed? : Unable Difficulty sitting down on and standing up from a chair with arms (e.g., wheelchair, bedside commode, etc,.)?:  Unable Help needed moving to and from a bed to chair (including a wheelchair)?: A Lot Help needed walking in hospital room?: Total Help needed climbing 3-5 steps with a railing? : Total 6 Click Score: 7    End of Session   Activity Tolerance: Patient tolerated treatment well Patient left: in chair;with call bell/phone within reach;with family/visitor present Nurse Communication: Mobility status PT Visit Diagnosis: Hemiplegia and hemiparesis;Difficulty in walking, not elsewhere classified (R26.2);Muscle weakness (generalized) (M62.81) Hemiplegia - Right/Left: Left Hemiplegia - dominant/non-dominant: Dominant Hemiplegia - caused by: Cerebral infarction     Time: 1400-1425 PT Time Calculation (min) (ACUTE ONLY): 25 min  Charges:  $Therapeutic Activity: 23-37 mins                     05/04/2018  Donnella Sham, PT [phone]  (pager)   [name] 05/04/2018, 4:09 PM

## 2018-05-04 NOTE — Progress Notes (Addendum)
Inpatient Rehabilitation Admissions Coordinator  I await updated physical therapy assessment this morning which I have arranged with therapy scheduler. I will then begin insurance authorization with [facility] for a possible inpt rehab admission pending insurance approval.  Danne Baxter, RN, MSN Rehab Admissions Coordinator [phone] 05/04/2018 9:27 AM

## 2018-05-04 NOTE — Progress Notes (Addendum)
PROGRESS NOTE    Katie Silva  DDU:[id number] DOB: 09-18-1925 DOA: 05/01/2018 PCP: Lucille Passy, MD   Brief Narrative:  Pleasant 84 year old female with history of mild dementia, hypertension, who was admitted to the hospital on 8/26 with increased confusion, slurred speech and left-sided weakness.  MRI on admission showed multiple strokes, likely embolic in nature.  She was also found to have paroxysmal A. fib.  Neurology was consulted.   Assessment & Plan:   Principal Problem:   Acute cardioembolic stroke [facility]) Active Problems:   HTN (hypertension)   Dementia   New onset atrial fibrillation (HCC)   Atrial fibrillation with RVR (HCC)   Cerebrovascular accident (CVA) due to embolism of precerebral artery (HCC)   Diastolic dysfunction   Benign essential HTN   Stage 3 chronic kidney disease ([place])   Acute stroke - MRI consistent with embolic stroke, neurology following appreciate input.  Thought 2/2 newly diagnosed atrial fibrillation. -She underwent a 2D echo which showed normal EF and grade 1 diastolic dysfunction (full read below) -A1c 5.2, lipid panel showed an LDL of 85 (HDL 48).  Continue pravastatin 20 mg. -Passed swallow eval, she is on a regular diet, PT recommended CIR -L sided weakness thought to be improving by previous provider  Mild oropharyngeal dysphagia: recommendations per speech.  Aspiration precautions. SLP Diet Recommendations: Regular solids;Thin liquid  Liquid Administration via: Cup;Straw  Medication Administration: Whole meds with puree  Supervision: Patient able to self feed  Compensations: Slow rate;Small sips/bites;Clear throat intermittently  Postural Changes: Seated upright at 90 degrees  Oral Care Recommendations: Oral care BID  Atrial fibrillation - patient's CHA2DS2-VASc Score for Stroke Risk is > 2, start Eliquis per neurology.  Stable H/H.  Hypertension -For now allow permissive hypertension.  Gradually normalize within 5-7  days.  Chronic kidney disease stage III -Creatinine is at baseline, stable, follow  Mild dementia -Exacerbated by CVA and hospital induced delirium, worse at night -delirium precautions - A&Ox2 this AM.  DVT prophylaxis: eliquis Code Status: DNR Family Communication: son at bedside Disposition Plan: CIR hopefully today - sounds like will likely be tomorrow, pt unable to be evaluated yesterday due to delirium requiring sedating meds.  Plan for eval today and likely CIR tomorrow.   Consultants:   neurology  Procedures:  Study Conclusions  - Left ventricle: The cavity size was normal. Wall thickness was   increased in a pattern of moderate LVH. Systolic function was   normal. The estimated ejection fraction was in the range of 60%   to 65%. Wall motion was normal; there were no regional wall   motion abnormalities. Doppler parameters are consistent with   abnormal left ventricular relaxation (grade 1 diastolic   dysfunction). The E/e&' ratio is >15, suggesting elevated LV   filling pressure. - Aortic valve: Sclerosis without stenosis. There was moderate   regurgitation. - Mitral valve: Calcified annulus. Moderate thickening with mild   end-systolic prolapse. Trivial regurgitation. - Left atrium: Moderately dilated. - Tricuspid valve: There was trivial regurgitation. - Pulmonary arteries: PA peak pressure: 33 mm Hg (S). - Inferior vena cava: The vessel was dilated. The respirophasic   diameter changes were blunted (< 50%), consistent with elevated   central venous pressure.  Impressions:  - LVEF 60-65%, moderate LVH, normal wall motion, grade 1 DD,   elevated LV filling pressure, aortic sclerosis with moderate AI,   heavy MAC with end-systolic bileaflet prolapse and trivial MR,   moderate LAE, trivial TR, RVSP 33 mmHg, dilated IVC.  Antimicrobials:  Anti-infectives (From admission, onward)   None     Subjective: A&Ox2. No pain. Son at bedside.    Objective: Vitals:   05/03/18 1651 05/03/18 2237 05/04/18 0427 05/04/18 0800  BP:  123/64 (!) 186/87   Pulse:  (!) 55 68   Resp:  17 18   Temp: 97.9 F (36.6 C) (!) 96.9 F (36.1 C) 97.8 F (36.6 C) 98 F (36.7 C)  TempSrc:  Axillary Oral Oral  SpO2:  96% 96%   Weight:      Height:        Intake/Output Summary (Last 24 hours) at 05/04/2018 0914 Last data filed at 05/04/2018 0430 Gross per 24 hour  Intake 934.62 ml  Output 1050 ml  Net -115.38 ml   Filed Weights   05/02/18 0639  Weight: 110.6 kg    Examination:  General exam: Appears calm and comfortable  Respiratory system: Clear to auscultation. Respiratory effort normal. Cardiovascular system: S1 & S2 heard, RRR.  Gastrointestinal system: Abdomen is nondistended, soft and nontender.  Central nervous system: Alert and oriented x2 (didn't know month).  L sided facial droop.  L sided weakness.  Extremities: Symmetric 5 x 5 power. Skin: No rashes, lesions or ulcers Psychiatry: Judgement and insight appear normal. Mood & affect appropriate.     Data Reviewed: I have personally reviewed following labs and imaging studies  CBC: Recent Labs  Lab 05/01/18 2206 05/04/18 0246  WBC 9.3 8.6  NEUTROABS 5.2  --   HGB 12.9 12.8  HCT 41.6 40.7  MCV 88.1 86.4  PLT 230 867   Basic Metabolic Panel: Recent Labs  Lab 05/01/18 2206 05/04/18 0246  NA 141 140  K 4.5 4.1  CL 109 111  CO2 24 24  GLUCOSE 101* 89  BUN 16 15  CREATININE 0.98 0.95  CALCIUM 9.3 8.7*   GFR: Estimated Creatinine Clearance: 49.6 mL/min (by C-G formula based on SCr of 0.95 mg/dL). Liver Function Tests: Recent Labs  Lab 05/01/18 2206  AST 21  ALT 12  ALKPHOS 38  BILITOT 0.7  PROT 6.2*  ALBUMIN 3.8   No results for input(s): LIPASE, AMYLASE in the last 168 hours. No results for input(s): AMMONIA in the last 168 hours. Coagulation Profile: Recent Labs  Lab 05/01/18 2206  INR 1.01   Cardiac Enzymes: No results for input(s):  CKTOTAL, CKMB, CKMBINDEX, TROPONINI in the last 168 hours. BNP (last 3 results) No results for input(s): PROBNP in the last 8760 hours. HbA1C: Recent Labs    05/03/18 0452  HGBA1C 5.2   CBG: No results for input(s): GLUCAP in the last 168 hours. Lipid Profile: Recent Labs    05/03/18 0452  CHOL 141  HDL 48  LDLCALC 85  TRIG 39  CHOLHDL 2.9   Thyroid Function Tests: No results for input(s): TSH, T4TOTAL, FREET4, T3FREE, THYROIDAB in the last 72 hours. Anemia Panel: No results for input(s): VITAMINB12, FOLATE, FERRITIN, TIBC, IRON, RETICCTPCT in the last 72 hours. Sepsis Labs: No results for input(s): PROCALCITON, LATICACIDVEN in the last 168 hours.  Recent Results (from the past 240 hour(s))  MRSA PCR Screening     Status: None   Collection Time: 05/02/18  6:43 AM  Result Value Ref Range Status   MRSA by PCR NEGATIVE NEGATIVE Final    Comment:        The GeneXpert MRSA Assay (FDA approved for NASAL specimens only), is one component of a comprehensive MRSA colonization surveillance program. It is not intended  to diagnose MRSA infection nor to guide or monitor treatment for MRSA infections. Performed at [facility], [place] [address], [place], [place] [address]          Radiology Studies: Ct Head Wo Contrast  Result Date: 05/03/2018 CLINICAL DATA:  Followup stroke. Multiple small acute infarctions shown by MRI yesterday. EXAM: CT HEAD WITHOUT CONTRAST TECHNIQUE: Contiguous axial images were obtained from the base of the skull through the vertex without intravenous contrast. COMPARISON:  CT and MRI 05/02/2018 FINDINGS: Brain: No identifiable acute finding by CT. Mild age related atrophy. Chronic small-vessel ischemic changes of the hemispheric white matter as seen previously. No identifiable acute infarction, mass lesion, hemorrhage, hydrocephalus or extra-axial collection. Vascular: There is atherosclerotic calcification of the major vessels at the base of the  brain. Skull: Negative Sinuses/Orbits: Clear/normal Other: None IMPRESSION: No acute infarction by CT. Atrophy and chronic small-vessel disease. Numerous small acute infarctions throughout the brain shown by MRI yesterday cannot be visualized by CT. No evidence of hemorrhagic complication or swelling. Electronically Signed   By: Nelson Chimes M.D.   On: 05/03/2018 11:48   Dg Swallowing Func-speech Pathology  Result Date: 05/02/2018 Objective Swallowing Evaluation: Type of Study: MBS-Modified Barium Swallow Study  Patient Details Name: Katie Silva MRN: [id number] Date of Birth: 1926-03-25 Today's Date: 05/02/2018 Time: SLP Start Time (ACUTE ONLY): 1510 -SLP Stop Time (ACUTE ONLY): 1530 SLP Time Calculation (min) (ACUTE ONLY): 20 min Past Medical History: Past Medical History: Diagnosis Date . Anemia  . Arthritis   hands, knees . Breast cancer, left breast ([place]) 2003  s/p mastectomy, chemo.  Dr. Chancy Milroy oncologist . Fracture of humerus, proximal, right, closed 07/2010  s/p hemiarthroplasty . Gastric ulcer with hemorrhage 02/14/2013 . Helicobacter pylori (H. pylori) infection 02/15/2013  eradication confirmed 05/2013 [facility] . Hypertension  . Multiple duodenal ulcers 02/14/2013 . Osteoporosis  . Stage 3 chronic kidney disease [facility])  Past Surgical History: Past Surgical History: Procedure Laterality Date . ABDOMINAL HYSTERECTOMY   . BREATH TEK H PYLORI N/A 06/06/2013  Procedure: BREATH TEK H PYLORI;  Surgeon: Gatha Mayer, MD;  Location: [facility] ENDOSCOPY;  Service: Endoscopy;  Laterality: N/A; . ESOPHAGOGASTRODUODENOSCOPY N/A 02/14/2013  Procedure: ESOPHAGOGASTRODUODENOSCOPY (EGD);  Surgeon: Gatha Mayer, MD;  Location: [facility];  Service: Endoscopy;  Laterality: N/A; . FEMUR IM NAIL  04/13/2012  Procedure: INTRAMEDULLARY (IM) NAIL FEMORAL;  Surgeon: Melina Schools, MD;  Location: [facility] ORS;  Service: Orthopedics;  Laterality: Left; . HIP SURGERY   . KNEE ARTHROSCOPY Bilateral   [facility] at [facility] . MASTECTOMY Left  .  SHOULDER HEMI-ARTHROPLASTY Right 07/2010  Dr. Mardelle Matte HPI: Katie Silva is a 84 y.o. female with medical history significant of dementia, HTN.  Patient presents to the ED with increased confusion, slurred speech, inability to ambulate, L>R sided weakness.  Some intermittent symptoms noticed a week ago. MRI reveals multiple acute embolic sub-cm strokes.  No data recorded Assessment / Plan / Recommendation CHL IP CLINICAL IMPRESSIONS 05/02/2018 Clinical Impression Pt demonstrates a mild oropharyngeal dysphagia with no overt weakness. Prominent cricopharyngeus observed with trace backflow to pyriforms in addition to mild entrapment of solids and liquids in the valleculae and pyriforms. This results in trace silent frank penetration which clears with spontanous and cued throat clearing. Suspect this is a baseline age related/primary cervical esophageal impairment. Recommend pt resumed a regular diet and thin liquids with additional aspiration precautions including cues to clear throat intermittently and to participate in consistent oral care. [name]  SLP Visit Diagnosis Frontal lobe and executive function deficit Attention and concentration deficit following -- Frontal lobe and executive function deficit following Cerebral infarction Impact on safety and function Mild aspiration risk   CHL IP TREATMENT RECOMMENDATION 05/02/2018 Treatment Recommendations Therapy as outlined in treatment plan below   Prognosis 05/02/2018 Prognosis for Safe Diet Advancement Good Barriers to Reach Goals -- Barriers/Prognosis Comment -- CHL IP DIET RECOMMENDATION 05/02/2018 SLP Diet Recommendations Regular solids;Thin liquid Liquid Administration via Cup;Straw Medication Administration Whole meds with puree Compensations Slow rate;Small sips/bites;Clear throat intermittently Postural Changes Seated upright at 90 degrees   CHL IP OTHER RECOMMENDATIONS 05/02/2018 Recommended Consults -- Oral Care Recommendations Oral care BID Other Recommendations --    CHL IP FOLLOW UP RECOMMENDATIONS 05/02/2018 Follow up Recommendations Skilled Nursing facility   [facility] IP FREQUENCY AND DURATION 05/02/2018 Speech Therapy Frequency (ACUTE ONLY) min 2x/week Treatment Duration 1 week      CHL IP ORAL PHASE 05/02/2018 Oral Phase WFL Oral - Pudding Teaspoon -- Oral - Pudding Cup -- Oral - Honey Teaspoon -- Oral - Honey Cup -- Oral - Nectar Teaspoon -- Oral - Nectar Cup -- Oral - Nectar Straw -- Oral - Thin Teaspoon -- Oral - Thin Cup -- Oral - Thin Straw -- Oral - Puree -- Oral - Mech Soft -- Oral - Regular -- Oral - Multi-Consistency -- Oral - Pill -- Oral Phase - Comment --  CHL IP PHARYNGEAL PHASE 05/02/2018 Pharyngeal Phase Impaired Pharyngeal- Pudding Teaspoon -- Pharyngeal -- Pharyngeal- Pudding Cup -- Pharyngeal -- Pharyngeal- Honey Teaspoon -- Pharyngeal -- Pharyngeal- Honey Cup -- Pharyngeal -- Pharyngeal- Nectar Teaspoon -- Pharyngeal -- Pharyngeal- Nectar Cup -- Pharyngeal -- Pharyngeal- Nectar Straw -- Pharyngeal -- Pharyngeal- Thin Teaspoon -- Pharyngeal -- Pharyngeal- Thin Cup Pharyngeal residue - valleculae;Pharyngeal residue - pyriform;Penetration/Apiration after swallow Pharyngeal Material enters airway, remains ABOVE vocal cords and not ejected out;Material enters airway, CONTACTS cords and then ejected out;Material does not enter airway Pharyngeal- Thin Straw Penetration/Apiration after swallow;Penetration/Aspiration during swallow;Pharyngeal residue - valleculae;Pharyngeal residue - pyriform;Pharyngeal residue - cp segment Pharyngeal Material enters airway, CONTACTS cords and not ejected out;Material enters airway, CONTACTS cords and then ejected out;Material does not enter airway Pharyngeal- Puree Pharyngeal residue - valleculae Pharyngeal -- Pharyngeal- Mechanical Soft -- Pharyngeal -- Pharyngeal- Regular Pharyngeal residue - valleculae Pharyngeal -- Pharyngeal- Multi-consistency -- Pharyngeal -- Pharyngeal- Pill -- Pharyngeal -- Pharyngeal Comment --  No flowsheet  data found. Herbie [place], [place] CCC-SLP [phone] Othelia Pulling Katherene Ponto 05/02/2018, 3:48 PM                   Scheduled Meds: .  stroke: mapping our early stages of recovery book   Does not apply Once  . apixaban  5 mg Oral BID  . pravastatin  20 mg Oral q1800  . traMADol  50 mg Oral Once   Continuous Infusions: . sodium chloride 50 mL/hr at 05/03/18 0300     LOS: 2 days    Time spent: over 30 min MDM mod with multiple medical issues    Fayrene Helper, MD [name] Pager [phone]  If 7PM-7AM, please contact night-coverage [email or web address] Password [facility] 05/04/2018, 9:14 AM

## 2018-05-04 NOTE — Progress Notes (Signed)
  Speech Language Pathology Treatment: Dysphagia  Patient Details Name: Katie Silva MRN: [id number] DOB: 1926/01/16 Today's Date: 05/04/2018 Time: 0623-7628 SLP Time Calculation (min) (ACUTE ONLY): 15 min  Assessment / Plan / Recommendation Clinical Impression  Pt awake and alert, but less talkative than at baseline. Per her son, she was able to eat breakfast. He confirms frequent coughing with water prior to admission. SLP reviewed findings of MBS, recommended strategies and rationale. SLP demonstrated appropriate position, which son reports he used for meal. Pt did have some coughing with water, but SLP reiterated Pt sensation of asrpiation with cough to eject, but need for a throat clear to eject penetrate, and demonstrated. Pt is likely at baseline with swallowing and education has been completed. However, f/u with SLP at next level of care may be beneficial to reinforce precautions with staff and family in a new setting.   HPI HPI: Katie Silva is a 84 y.o. female with medical history significant of dementia, HTN.  Patient presents to the ED with increased confusion, slurred speech, inability to ambulate, L>R sided weakness.  Some intermittent symptoms noticed a week ago. MRI reveals multiple acute embolic sub-cm strokes.      SLP Plan  Continue with current plan of care       Recommendations  Diet recommendations: Regular;Thin liquid Liquids provided via: Cup;Straw Medication Administration: Whole meds with puree Supervision: Patient able to self feed Compensations: Slow rate;Small sips/bites;Clear throat intermittently Postural Changes and/or Swallow Maneuvers: Seated upright 90 degrees                Follow up Recommendations: Inpatient Rehab Plan: Continue with current plan of care       [place] Ladaysha Soutar, MA CCC-SLP [phone]  Lynann Beaver 05/04/2018, 11:52 AM

## 2018-05-04 NOTE — Plan of Care (Signed)
  Problem: Education: Goal: Knowledge of secondary prevention will improve Outcome: Progressing   Problem: Coping: Goal: Will identify appropriate support needs Outcome: Progressing   Problem: Self-Care: Goal: Ability to communicate needs accurately will improve Outcome: Progressing   Problem: Ischemic Stroke/TIA Tissue Perfusion: Goal: Complications of ischemic stroke/TIA will be minimized Outcome: Progressing

## 2018-05-05 ENCOUNTER — Other Ambulatory Visit: Payer: Self-pay

## 2018-05-05 LAB — BASIC METABOLIC PANEL
Anion gap: 10 (ref 5–15)
BUN: 12 mg/dL (ref 8–23)
CALCIUM: 8.7 mg/dL — AB (ref 8.9–10.3)
CO2: 21 mmol/L — ABNORMAL LOW (ref 22–32)
CREATININE: 0.93 mg/dL (ref 0.44–1.00)
Chloride: 108 mmol/L (ref 98–111)
GFR calc Af Amer: >60 mL/min (ref >60)
GFR, EST NON AFRICAN AMERICAN: 53 mL/min — AB (ref >60)
GLUCOSE: 118 mg/dL — AB (ref 70–99)
Potassium: 3.8 mmol/L (ref 3.5–5.1)
Sodium: 139 mmol/L (ref 135–145)

## 2018-05-05 LAB — MAGNESIUM: MAGNESIUM: 2 mg/dL (ref 1.7–2.4)

## 2018-05-05 LAB — CBC
HCT: 42.5 % (ref 36.0–46.0)
Hemoglobin: 13.5 g/dL (ref 12.0–15.0)
MCH: 27.2 pg (ref 26.0–34.0)
MCHC: 31.8 g/dL (ref 30.0–36.0)
MCV: 85.7 fL (ref 78.0–100.0)
PLATELETS: 226 10*3/uL (ref 150–400)
RBC: 4.96 MIL/uL (ref 3.87–5.11)
RDW: 14.6 % (ref 11.5–15.5)
WBC: 11.3 10*3/uL — ABNORMAL HIGH (ref 4.0–10.5)

## 2018-05-05 MED ORDER — METOPROLOL TARTRATE 12.5 MG HALF TABLET
12.5000 mg | ORAL_TABLET | Freq: Two times a day (BID) | ORAL | Status: DC
  Administered 2018-05-05 – 2018-05-07 (×3): 12.5 mg via ORAL
  Filled 2018-05-05 (×4): qty 1

## 2018-05-05 MED ORDER — TRAZODONE HCL 50 MG PO TABS
25.0000 mg | ORAL_TABLET | Freq: Every evening | ORAL | Status: DC | PRN
  Administered 2018-05-05: 25 mg via ORAL
  Filled 2018-05-05: qty 1

## 2018-05-05 NOTE — Progress Notes (Signed)
Occupational Therapy Treatment Patient Details Name: Katie Silva MRN: [id number] DOB: [birth year] Today's Date: 05/05/2018    History of present illness Patient is a 84 y/o female who presents with left sided weakness, gait instability and slurred speech. Brain MRI- multiple acute embolic infarcts ( L frontal, R caudate, R putman, R pons, Bil Cerebellar hemispheres). Found to have new onset A-fib in ED. PMH includes dementia, RA, HTN, breast ca.    OT comments  Pt making limited progress towards OT goals today. Pt slightly lethargic (daughter states that Pt did not sleep well last night). LUE unable to hold onto RW this session, Pt incontinent with sit <>stand performed pivot transfer mod A +2 to BSC, and then max/total A for peri care followed by pivot transfer to recliner. In addition to patient focus, caregiver education/biomechanics are going to be essential at CIR level.    Follow Up Recommendations  CIR    Equipment Recommendations  3 in 1 bedside commode;Hospital bed    Recommendations for Other Services      Precautions / Restrictions Precautions Precautions: Fall Restrictions Weight Bearing Restrictions: No       Mobility Bed Mobility Overal bed mobility: Needs Assistance Bed Mobility: Supine to Sit     Supine to sit: +2 for physical assistance;Mod assist;Max assist     General bed mobility comments: assist for management of BLE, trunk elevation, multimodal cues and assist throughout bed mobility  Transfers Overall transfer level: Needs assistance Equipment used: Rolling walker (2 wheeled) Transfers: Sit to/from [employer] Transfers Sit to Stand: Mod assist;+2 physical assistance;+2 safety/equipment Stand pivot transfers: Mod assist;+2 physical assistance;+2 safety/equipment       General transfer comment: LUE required hand over hand assist to stay on RW this session, limited to pivot and unable to mobilize as much this session (daughter reports that  Pt did not sleep well last night)    Balance Overall balance assessment: Needs assistance;History of Falls Sitting-balance support: Feet supported;Single extremity supported Sitting balance-Leahy Scale: Fair Sitting balance - Comments: able to sit EOB with therapist support     Standing balance-Leahy Scale: Poor Standing balance comment: needed significant stability assist and/or use of AD                           ADL either performed or assessed with clinical judgement   ADL Overall ADL's : Needs assistance/impaired             Lower Body Bathing: Maximal assistance;Sitting/lateral leans       Lower Body Dressing: Total assistance;Sit to/from stand Lower Body Dressing Details (indicate cue type and reason): to don socks Toilet Transfer: Moderate assistance;+2 for physical assistance;+2 for safety/equipment Toilet Transfer Details (indicate cue type and reason): Pt fatigues quickly Toileting- Clothing Manipulation and Hygiene: Total assistance;+2 for physical assistance;Sit to/from stand Toileting - Clothing Manipulation Details (indicate cue type and reason): Pt attempted with RUE in sitting, unable to perform therapist had to assist in standing     Functional mobility during ADLs: Moderate assistance;+2 for physical assistance;+2 for safety/equipment;Cueing for sequencing;Rolling walker(LUE is unable to hang onto walker during mobility) General ADL Comments: cues for posture throughout session     Vision   Vision Assessment?: Vision impaired- to be further tested in functional context;Yes Eye Alignment: Within Functional Limits Alignment/Gaze Preference: Gaze right Tracking/Visual Pursuits: Impaired - to be further tested in functional context Additional Comments: will scan to left with cues   Perception  Praxis      Cognition Arousal/Alertness: Awake/alert Behavior During Therapy: Flat affect Overall Cognitive Status: History of cognitive  impairments - at baseline Area of Impairment: Attention                 Orientation Level: Disoriented to;Place Current Attention Level: Sustained Memory: Decreased short-term memory Following Commands: Follows one step commands inconsistently;Follows one step commands with increased time   Awareness: Intellectual Problem Solving: Slow processing General Comments: Hx of dementia. Not able to state year of birth but able to state month/date. Some left inattention noted.         Exercises     Shoulder Instructions       General Comments Pt incontinent with stading (daughter reports this is normal)    Pertinent Vitals/ Pain       Pain Assessment: No/denies pain  Home Living                                          Prior Functioning/Environment              Frequency  Min 3X/week        Progress Toward Goals  OT Goals(current goals can now be found in the care plan section)  Progress towards OT goals: Progressing toward goals  Acute Rehab OT Goals Patient Stated Goal: get to rehab OT Goal Formulation: With patient/family Time For Goal Achievement: 05/17/18 Potential to Achieve Goals: Good  Plan Discharge plan remains appropriate;Frequency remains appropriate    Co-evaluation    PT/OT/SLP Co-Evaluation/Treatment: Yes Reason for Co-Treatment: Complexity of the patient's impairments (multi-system involvement);Necessary to address cognition/behavior during functional activity;For patient/therapist safety;To address functional/ADL transfers PT goals addressed during session: Mobility/safety with mobility;Balance OT goals addressed during session: ADL's and self-care;Strengthening/ROM      AM-PAC PT "6 Clicks" Daily Activity     Outcome Measure   Help from another person eating meals?: A Little Help from another person taking care of personal grooming?: A Lot Help from another person toileting, which includes using toliet, bedpan, or  urinal?: A Lot Help from another person bathing (including washing, rinsing, drying)?: A Lot Help from another person to put on and taking off regular upper body clothing?: A Lot Help from another person to put on and taking off regular lower body clothing?: A Lot 6 Click Score: 13    End of Session Equipment Utilized During Treatment: Gait belt;Rolling walker  OT Visit Diagnosis: Unsteadiness on feet (R26.81);Muscle weakness (generalized) (M62.81)   Activity Tolerance Patient limited by lethargy   Patient Left in chair;with call bell/phone within reach;with chair alarm set;with family/visitor present(mitt off - daughter present to watch her, RN aware)   Nurse Communication Mobility status;Precautions;Other (comment)(need for purewick to be replaced)        Time: 4585-9292 OT Time Calculation (min): 41 min  Charges: OT General Charges $OT Visit: 1 Visit OT Treatments $Self Care/Home Management : 23-37 mins  Hulda Humphrey OTR/L Acute Rehabilitation Services Pager: [phone] Office: [place] 05/05/2018, 3:15 PM

## 2018-05-05 NOTE — Progress Notes (Addendum)
Inpatient Rehabilitation Admissions Coordinator  I have received request from [facility] for treating MD peer to peer with their MD. I have placed that request and discussed with Dr. Florene Glen and await their discussion. I have not informed patient or family until I hear final decision. I will follow up in the morning.  Danne Baxter, RN, MSN Rehab Admissions Coordinator [phone] 05/05/2018 4:00 PM

## 2018-05-05 NOTE — Progress Notes (Addendum)
PROGRESS NOTE    Katie Silva  DGL:[id number] DOB: 01/21/1926 DOA: 05/01/2018 PCP: Lucille Passy, MD   Brief Narrative:  Pleasant 84 year old female with history of mild dementia, hypertension, who was admitted to the hospital on 8/26 with increased confusion, slurred speech and left-sided weakness.  MRI on admission showed multiple strokes, likely embolic in nature.  She was also found to have paroxysmal [name]. fib.  Neurology was consulted.   Assessment & Plan:   Principal Problem:   Acute cardioembolic stroke ([place]) Active Problems:   HTN (hypertension)   Dementia   New onset atrial fibrillation (HCC)   Atrial fibrillation with RVR (HCC)   Cerebrovascular accident (CVA) due to embolism of precerebral artery (HCC)   Diastolic dysfunction   Benign essential HTN   Stage 3 chronic kidney disease ([place])  Medicare requested peer to peer for CIR.  Was awaiting call today, but did not hear back yet.  Ayodele Hartsock new physician will pick up this case on 8/30.  I believe Katie Silva would benefit from CIR given her recent stroke and need for rehab as well as the need to continue to follow her HR and BP and continue to adjust medications as needed for her afib and hypertension.  Acute stroke - MRI consistent with embolic stroke, neurology following appreciate input.  Thought 2/2 newly diagnosed atrial fibrillation. -She underwent Veta Dambrosia 2D echo which showed normal EF and grade 1 diastolic dysfunction (full read below) -A1c 5.2, lipid panel showed an LDL of 85 (HDL 48).  Continue pravastatin 20 mg. -Passed swallow eval, she is on [name] regular diet, PT recommended CIR -Stable, follow CIR placement pending  Mild oropharyngeal dysphagia: recommendations per speech.  Aspiration precautions. SLP Diet Recommendations: Regular solids;Thin liquid  Liquid Administration via: Cup;Straw  Medication Administration: Whole meds with puree  Supervision: Patient able to self feed  Compensations: Slow rate;Small  sips/bites;Clear throat intermittently  Postural Changes: Seated upright at 90 degrees  Oral Care Recommendations: Oral care BID  Atrial fibrillation - patient's CHA2DS2-VASc Score for Stroke Risk is > 2, start Eliquis per neurology.  Stable H/H. - some rapid rates noted, will start metoprolol 12.5 mg BID  Hypertension -For now allow permissive hypertension.  Gradually normalize within 5-7 days. -Follow with metoprolol  Chronic kidney disease stage III -Creatinine is at baseline, stable, follow  Mild dementia - Exacerbated by CVA and hospital induced delirium, worse at night - Some inattention, sleepiness, delirium this AM from lack of sleep last night  - delirium precautions  Leukocyotosis: - mild, no si/sx of infection, follow.  She afebrile (temp was 99.8 this AM, follow closely).  Follow for now.  DVT prophylaxis: eliquis Code Status: DNR Family Communication: daughter at bedside Disposition Plan: CIR hopefully today.  This is still pending.   Consultants:   neurology  Procedures:  Study Conclusions  - Left ventricle: The cavity size was normal. Wall thickness was   increased in [name] pattern of moderate LVH. Systolic function was   normal. The estimated ejection fraction was in the range of 60%   to 65%. Wall motion was normal; there were no regional wall   motion abnormalities. Doppler parameters are consistent with   abnormal left ventricular relaxation (grade 1 diastolic   dysfunction). The E/e&' ratio is >15, suggesting elevated LV   filling pressure. - Aortic valve: Sclerosis without stenosis. There was moderate   regurgitation. - Mitral valve: Calcified annulus. Moderate thickening with mild   end-systolic prolapse. Trivial regurgitation. - Left atrium:  Moderately dilated. - Tricuspid valve: There was trivial regurgitation. - Pulmonary arteries: PA peak pressure: 33 mm Hg (S). - Inferior vena cava: The vessel was dilated. The respirophasic   diameter  changes were blunted (< 50%), consistent with elevated   central venous pressure.  Impressions:  - LVEF 60-65%, moderate LVH, normal wall motion, grade 1 DD,   elevated LV filling pressure, aortic sclerosis with moderate AI,   heavy MAC with end-systolic bileaflet prolapse and trivial MR,   moderate LAE, trivial TR, RVSP 33 mmHg, dilated IVC.  Antimicrobials:  Anti-infectives (From admission, onward)   None     Subjective: Very sleepy. Denies any pain. Working with PT.  Objective: Vitals:   05/05/18 0000 05/05/18 0400 05/05/18 0739 05/05/18 0800  BP: (!) 166/126 (!) 163/67 (!) 158/75 (!) 143/103  Pulse: (!) 107 83 86 80  Resp: (!) 21 (!) 25 (!) 23 (!) 23  Temp: 98.9 F (37.2 C) 98.6 F (37 C) 99.8 F (37.7 C)   TempSrc: Oral Oral Axillary   SpO2: 97% 98% 98% 99%  Weight:      Height:        Intake/Output Summary (Last 24 hours) at 05/05/2018 1130 Last data filed at 05/05/2018 0800 Gross per 24 hour  Intake 2356.77 ml  Output 700 ml  Net 1656.77 ml   Filed Weights   05/02/18 0639  Weight: 110.6 kg    Examination:  General: No acute distress. Cardiovascular: Heart sounds show Katie Silva regular rate, and rhythm. Lungs: Clear to auscultation bilaterally with good air movement.  Abdomen: Soft, nontender, nondistended  Neurological: Alert and oriented 1.  Sleepy this morning.  Working with PT when I see her, stands with assistance. No new focal deficits appreciated. Skin: Warm and dry. No rashes or lesions. Extremities: No clubbing or cyanosis. No edema.  Psychiatric: Insight and judgment are impaired.    Data Reviewed: I have personally reviewed following labs and imaging studies  CBC: Recent Labs  Lab 05/01/18 2206 05/04/18 0246 05/05/18 0357  WBC 9.3 8.6 11.3*  NEUTROABS 5.2  --   --   HGB 12.9 12.8 13.5  HCT 41.6 40.7 42.5  MCV 88.1 86.4 85.7  PLT 230 221 876   Basic Metabolic Panel: Recent Labs  Lab 05/01/18 2206 05/04/18 0246 05/05/18 0357    NA 141 140 139  K 4.5 4.1 3.8  CL 109 111 108  CO2 24 24 21*  GLUCOSE 101* 89 118*  BUN _0 CREATININE 0.98 0.95 0.93  CALCIUM 9.3 8.7* 8.7*  MG  --   --  2.0   GFR: Estimated Creatinine Clearance: 50.6 mL/min (by C-G formula based on SCr of 0.93 mg/dL). Liver Function Tests: Recent Labs  Lab 05/01/18 2206  AST 21  ALT 12  ALKPHOS 38  BILITOT 0.7  PROT 6.2*  ALBUMIN 3.8   No results for input(s): LIPASE, AMYLASE in the last 168 hours. No results for input(s): AMMONIA in the last 168 hours. Coagulation Profile: Recent Labs  Lab 05/01/18 2206  INR 1.01   Cardiac Enzymes: No results for input(s): CKTOTAL, CKMB, CKMBINDEX, TROPONINI in the last 168 hours. BNP (last 3 results) No results for input(s): PROBNP in the last 8760 hours. HbA1C: Recent Labs    05/03/18 0452  HGBA1C 5.2   CBG: No results for input(s): GLUCAP in the last 168 hours. Lipid Profile: Recent Labs    05/03/18 0452  CHOL 141  HDL 48  LDLCALC 85  TRIG 39  CHOLHDL 2.9   Thyroid Function Tests: No results for input(s): TSH, T4TOTAL, FREET4, T3FREE, THYROIDAB in the last 72 hours. Anemia Panel: No results for input(s): VITAMINB12, FOLATE, FERRITIN, TIBC, IRON, RETICCTPCT in the last 72 hours. Sepsis Labs: No results for input(s): PROCALCITON, LATICACIDVEN in the last 168 hours.  Recent Results (from the past 240 hour(s))  MRSA PCR Screening     Status: None   Collection Time: 05/02/18  6:43 AM  Result Value Ref Range Status   MRSA by PCR NEGATIVE NEGATIVE Final    Comment:        The GeneXpert MRSA Assay (FDA approved for NASAL specimens only), is one component of Eli Adami comprehensive MRSA colonization surveillance program. It is not intended to diagnose MRSA infection nor to guide or monitor treatment for MRSA infections. Performed at [facility], [place] [address] [place], [place] [address]          Radiology Studies: Ct Head Wo Contrast  Result Date:  05/03/2018 CLINICAL DATA:  Followup stroke. Multiple small acute infarctions shown by MRI yesterday. EXAM: CT HEAD WITHOUT CONTRAST TECHNIQUE: Contiguous axial images were obtained from the base of the skull through the vertex without intravenous contrast. COMPARISON:  CT and MRI 05/02/2018 FINDINGS: Brain: No identifiable acute finding by CT. Mild age related atrophy. Chronic small-vessel ischemic changes of the hemispheric white matter as seen previously. No identifiable acute infarction, mass lesion, hemorrhage, hydrocephalus or extra-axial collection. Vascular: There is atherosclerotic calcification of the major vessels at the base of the brain. Skull: Negative Sinuses/Orbits: Clear/normal Other: None IMPRESSION: No acute infarction by CT. Atrophy and chronic small-vessel disease. Numerous small acute infarctions throughout the brain shown by MRI yesterday cannot be visualized by CT. No evidence of hemorrhagic complication or swelling. Electronically Signed   By: Nelson Chimes M.D.   On: 05/03/2018 11:48        Scheduled Meds: .  stroke: mapping our early stages of recovery book   Does not apply Once  . apixaban  5 mg Oral BID  . metoprolol tartrate  12.5 mg Oral BID  . pravastatin  20 mg Oral q1800  . traMADol  50 mg Oral Once   Continuous Infusions: . sodium chloride 50 mL/hr at 05/05/18 0800     LOS: 3 days    Time spent: over 30 min MDM mod with multiple medical issues    Fayrene Helper, MD [name] Pager [phone]  If 7PM-7AM, please contact night-coverage [email or web address] Password [email or web address] 05/05/2018, 11:30 AM

## 2018-05-05 NOTE — Progress Notes (Signed)
Physical Therapy Treatment Patient Details Name: Katie Silva MRN: [id number] DOB: [birth year] Today's Date: 05/05/2018    History of Present Illness Patient is a 84 y/o female who presents with left sided weakness, gait instability and slurred speech. Brain MRI- multiple acute embolic infarcts ( L frontal, R caudate, R putman, R pons, Bil Cerebellar hemispheres). Found to have new onset A-fib in ED. PMH includes dementia, RA, HTN, breast ca.     PT Comments    Making slower progress.  Emphasis on sitting balance with dynamic tasks at EOB, sit to stand into the RW, transfers and pre gait activity.  Limited to standing at times by incontinence.   Follow Up Recommendations  CIR     Equipment Recommendations  None recommended by PT    Recommendations for Other Services       Precautions / Restrictions Precautions Precautions: Fall Restrictions Weight Bearing Restrictions: No    Mobility  Bed Mobility Overal bed mobility: Needs Assistance Bed Mobility: Supine to Sit     Supine to sit: +2 for physical assistance;Mod assist;Max assist     General bed mobility comments: assist for management of BLE, trunk elevation, multimodal cues and assist throughout bed mobility  Transfers Overall transfer level: Needs assistance Equipment used: Rolling walker (2 wheeled) Transfers: Sit to/from [employer] Transfers Sit to Stand: Mod assist;+2 physical assistance;+2 safety/equipment Stand pivot transfers: Mod assist;+2 physical assistance;+2 safety/equipment       General transfer comment: LUE required hand over hand assist to stay on RW this session, limited to pivot and unable to mobilize as much this session (daughter reports that Pt did not sleep well last night)  Ambulation/Gait             General Gait Details: pivot steps only   Stairs             Wheelchair Mobility    Modified Rankin (Stroke Patients Only) Modified Rankin (Stroke Patients  Only) Pre-Morbid Rankin Score: Moderate disability Modified Rankin: Moderately severe disability     Balance Overall balance assessment: Needs assistance;History of Falls Sitting-balance support: Feet supported;Single extremity supported Sitting balance-Leahy Scale: Fair Sitting balance - Comments: able to sit EOB with therapist support     Standing balance-Leahy Scale: Poor Standing balance comment: needed significant stability assist and/or use of AD                            Cognition Arousal/Alertness: Awake/alert Behavior During Therapy: Flat affect Overall Cognitive Status: History of cognitive impairments - at baseline Area of Impairment: Attention                 Orientation Level: Disoriented to;Place Current Attention Level: Sustained Memory: Decreased short-term memory Following Commands: Follows one step commands inconsistently;Follows one step commands with increased time   Awareness: Intellectual Problem Solving: Slow processing General Comments: Hx of dementia. Not able to state year of birth but able to state month/date. Some left inattention noted.       Exercises      General Comments General comments (skin integrity, edema, etc.): Pt incontinent with standing (daughter reports this is normal)  Time spent in cleanup and pericare..      Pertinent Vitals/Pain Pain Assessment: No/denies pain    Home Living                      Prior Function  PT Goals (current goals can now be found in the care plan section) Acute Rehab PT Goals Patient Stated Goal: get to rehab PT Goal Formulation: With patient/family Time For Goal Achievement: 05/16/18 Potential to Achieve Goals: Good Progress towards PT goals: Progressing toward goals    Frequency    Min 4X/week      PT Plan Current plan remains appropriate    Co-evaluation PT/OT/SLP Co-Evaluation/Treatment: Yes Reason for Co-Treatment: Complexity of the  patient's impairments (multi-system involvement) PT goals addressed during session: Mobility/safety with mobility OT goals addressed during session: ADL's and self-care      AM-PAC PT "6 Clicks" Daily Activity  Outcome Measure  Difficulty turning over in bed (including adjusting bedclothes, sheets and blankets)?: Unable Difficulty moving from lying on back to sitting on the side of the bed? : Unable Difficulty sitting down on and standing up from a chair with arms (e.g., wheelchair, bedside commode, etc,.)?: Unable Help needed moving to and from a bed to chair (including a wheelchair)?: A Lot Help needed walking in hospital room?: Total Help needed climbing 3-5 steps with a railing? : Total 6 Click Score: 7    End of Session Equipment Utilized During Treatment: Gait belt Activity Tolerance: Patient tolerated treatment well(some fatigue noted after standing for pericare) Patient left: in chair;with call bell/phone within reach;with family/visitor present Nurse Communication: Mobility status PT Visit Diagnosis: Hemiplegia and hemiparesis;Difficulty in walking, not elsewhere classified (R26.2);Muscle weakness (generalized) (M62.81) Hemiplegia - Right/Left: Left Hemiplegia - dominant/non-dominant: Dominant Hemiplegia - caused by: Cerebral infarction     Time: 4158-3094 PT Time Calculation (min) (ACUTE ONLY): 41 min  Charges:  $Therapeutic Activity: 8-22 mins                     05/05/2018  Donnella Sham, PT [phone]  (pager)   Tessie Fass Henretta Quist 05/05/2018, 9:51 PM

## 2018-05-06 ENCOUNTER — Inpatient Hospital Stay (HOSPITAL_COMMUNITY): Payer: Medicare PPO

## 2018-05-06 DIAGNOSIS — G9341 Metabolic encephalopathy: Secondary | ICD-10-CM

## 2018-05-06 DIAGNOSIS — R400 Somnolence: Secondary | ICD-10-CM

## 2018-05-06 LAB — CBC
HCT: 39.4 % (ref 36.0–46.0)
Hemoglobin: 12.3 g/dL (ref 12.0–15.0)
MCH: 27 pg (ref 26.0–34.0)
MCHC: 31.2 g/dL (ref 30.0–36.0)
MCV: 86.6 fL (ref 78.0–100.0)
PLATELETS: 210 10*3/uL (ref 150–400)
RBC: 4.55 MIL/uL (ref 3.87–5.11)
RDW: 14.6 % (ref 11.5–15.5)
WBC: 9.3 10*3/uL (ref 4.0–10.5)

## 2018-05-06 LAB — BASIC METABOLIC PANEL
Anion gap: 11 (ref 5–15)
BUN: 15 mg/dL (ref 8–23)
CALCIUM: 8.5 mg/dL — AB (ref 8.9–10.3)
CO2: 20 mmol/L — ABNORMAL LOW (ref 22–32)
CREATININE: 0.95 mg/dL (ref 0.44–1.00)
Chloride: 109 mmol/L (ref 98–111)
GFR calc Af Amer: 59 mL/min — ABNORMAL LOW (ref >60)
GFR, EST NON AFRICAN AMERICAN: 51 mL/min — AB (ref >60)
Glucose, Bld: 100 mg/dL — ABNORMAL HIGH (ref 70–99)
POTASSIUM: 3.9 mmol/L (ref 3.5–5.1)
SODIUM: 140 mmol/L (ref 135–145)

## 2018-05-06 MED ORDER — METHOCARBAMOL 1000 MG/10ML IJ SOLN
500.0000 mg | Freq: Once | INTRAVENOUS | Status: AC
  Administered 2018-05-06: 500 mg via INTRAVENOUS
  Filled 2018-05-06: qty 5

## 2018-05-06 MED ORDER — GLYCERIN (LAXATIVE) 2.1 G RE SUPP
1.0000 | Freq: Once | RECTAL | Status: AC
  Administered 2018-05-06: 1 via RECTAL
  Filled 2018-05-06: qty 1

## 2018-05-06 NOTE — Social Work (Signed)
Aware pt denied by [facility] for CIR transfer, will f/u with daughter [name]. Pt is managed by [facility], will submit clinicals to initiate insurance authorization.   Alexander Mt, [place] Work [phone]

## 2018-05-06 NOTE — Progress Notes (Signed)
PROGRESS NOTE                                                                                                                                                                                                             Patient Demographics:    Katie Silva, is a 84 y.o. female, DOB - [birth year], EXB:[id number]  Admit date - 05/01/2018   Admitting Physician Etta Quill, DO  Outpatient Primary MD for the patient is Lucille Passy, MD  LOS - 4  Outpatient Specialists:  Chief Complaint  Patient presents with  . Altered Mental Status       Brief Narrative   84 year old female with history of mild dementia, hypertension, independent at home presented to the hospital on 8/26 with increased confusion with slurred speech and left-sided weakness.  Work-up showing embolic stroke and also found to have paroxysmal A. fib.   Subjective:   Patient quite confused and poorly communicative.   Assessment  & Plan :    Principal Problem:   Acute cardioembolic stroke ([place]) Suspected due to newly diagnosed A. fib.  2D echo with normal EF and grade 1 diastolic dysfunction.  A1c of 5.2.  LDL of 85.  Continue pravastatin.  Having difficulty swallowing today.  Repeat swallow eval. PT recommended CIR but patient now SNF eligible.  Social work aware.  Active Problems: Acute metabolic encephalopathy Suspect delirium delirium due to her acute stroke worsening her dementia.  No signs of infection.  Check chest x-ray to rule out aspiration.  Repeat swallow eval.  Mild oropharyngeal dysphasia Continue aspiration precaution.  If confusion and somnolence persists she will need to be evaluated by SLP again in the morning.  New onset atrial fibrillation CHADS 2 Vasc score of  of 6 .  Started on Eliquis and low-dose beta-blocker.  Essential hypertension Allow permissive blood pressure.  CKD stage III Stable.   Disposition: PT  recommended CIR.  Had peer to peer discussion this morning.  Given her increased confusion and somnolence she is deemed inappropriate for CIR and would possibly benefit from being in a skilled nursing facility.  Family interested in long-term skilled care. Social work consulted.     Code Status : DNR  Family Communication  : Son at bedside  Disposition Plan  : SNF possibly in 1-2 days  Barriers For Discharge :  Active symptoms  Consults  : Neurology/stroke  Procedures  : CT head, MRI brain, ECHO  DVT Prophylaxis  : eliquis  Lab Results  Component Value Date   PLT 210 05/06/2018    Antibiotics  :   Anti-infectives (From admission, onward)   None        Objective:   Vitals:   05/06/18 0446 05/06/18 0827 05/06/18 0902 05/06/18 1200  BP: (!) 150/67 (!) 95/32 (!) 109/52 (!) 102/35  Pulse: 63 (!) 52  (!) 48  Resp:  18  18  Temp: 98 F (36.7 C) (!) 97.3 F (36.3 C)    TempSrc: Oral Oral    SpO2: 99% 98%  99%  Weight:      Height:        Wt Readings from Last 3 Encounters:  05/02/18 110.6 kg  11/29/17 62.8 kg  09/13/17 60.9 kg     Intake/Output Summary (Last 24 hours) at 05/06/2018 1415 Last data filed at 05/06/2018 1200 Gross per 24 hour  Intake 1079.7 ml  Output 300 ml  Net 779.7 ml     Physical Exam  Gen: elderly female appears somnolent, arousable but confused HEENT: no pallor, moist mucosa, supple neck Chest: clear b/l, no added sounds CVS: S1&S2 irregular, no murmurs, rubs or gallop GI: soft, NT, ND, BS+ Musculoskeletal: warm, no edema CNS: AAOX0, somnolent, poorly communicative,    Data Review:    CBC Recent Labs  Lab 05/01/18 2206 05/04/18 0246 05/05/18 0357 05/06/18 0356  WBC 9.3 8.6 11.3* 9.3  HGB 12.9 12.8 13.5 12.3  HCT 41.6 40.7 42.5 39.4  PLT 230 221 226 210  MCV 88.1 86.4 85.7 86.6  MCH 27.3 27.2 27.2 27.0  MCHC 31.0 31.4 31.8 31.2  RDW 14.9 14.7 14.6 14.6  LYMPHSABS 2.6  --   --   --   MONOABS 1.4*  --   --   --     EOSABS 0.1  --   --   --   BASOSABS 0.0  --   --   --     Chemistries  Recent Labs  Lab 05/01/18 2206 05/04/18 0246 05/05/18 0357 05/06/18 0356  NA 141 140 139 140  K 4.5 4.1 3.8 3.9  CL 109 111 108 109  CO2 24 24 21* 20*  GLUCOSE 101* 89 118* 100*  BUN 16 15 12 15   CREATININE 0.98 0.95 0.93 0.95  CALCIUM 9.3 8.7* 8.7* 8.5*  MG  --   --  2.0  --   AST 21  --   --   --   ALT 12  --   --   --   ALKPHOS 38  --   --   --   BILITOT 0.7  --   --   --    ------------------------------------------------------------------------------------------------------------------ No results for input(s): CHOL, HDL, LDLCALC, TRIG, CHOLHDL, LDLDIRECT in the last 72 hours.  Lab Results  Component Value Date   HGBA1C 5.2 05/03/2018   ------------------------------------------------------------------------------------------------------------------ No results for input(s): TSH, T4TOTAL, T3FREE, THYROIDAB in the last 72 hours.  Invalid input(s): FREET3 ------------------------------------------------------------------------------------------------------------------ No results for input(s): VITAMINB12, FOLATE, FERRITIN, TIBC, IRON, RETICCTPCT in the last 72 hours.  Coagulation profile Recent Labs  Lab 05/01/18 2206  INR 1.01    No results for input(s): DDIMER in the last 72 hours.  Cardiac Enzymes No results for input(s): CKMB, TROPONINI, MYOGLOBIN in the last 168 hours.  Invalid input(s): CK ------------------------------------------------------------------------------------------------------------------ No results found for: BNP  Inpatient Medications  Scheduled Meds: .  stroke: mapping our early stages of recovery book   Does not apply Once  . apixaban  5 mg Oral BID  . metoprolol tartrate  12.5 mg Oral BID  . pravastatin  20 mg Oral q1800  . traMADol  50 mg Oral Once   Continuous Infusions: . sodium chloride Stopped (05/06/18 0506)   PRN Meds:.acetaminophen **OR**  acetaminophen (TYLENOL) oral liquid 160 mg/5 mL **OR** acetaminophen, haloperidol lactate, traZODone  Micro Results Recent Results (from the past 240 hour(s))  MRSA PCR Screening     Status: None   Collection Time: 05/02/18  6:43 AM  Result Value Ref Range Status   MRSA by PCR NEGATIVE NEGATIVE Final    Comment:        The GeneXpert MRSA Assay (FDA approved for NASAL specimens only), is one component of a comprehensive MRSA colonization surveillance program. It is not intended to diagnose MRSA infection nor to guide or monitor treatment for MRSA infections. Performed at [facility], [place] [address] [place], [place] [address]     Radiology Reports Ct Angio Head W Or Wo Contrast  Result Date: 05/02/2018 CLINICAL DATA:  84 y/o  F; left-sided weakness and slurred speech. EXAM: CT ANGIOGRAPHY HEAD AND NECK TECHNIQUE: Multidetector CT imaging of the head and neck was performed using the standard protocol during bolus administration of intravenous contrast. Multiplanar CT image reconstructions and MIPs were obtained to evaluate the vascular anatomy. Carotid stenosis measurements (when applicable) are obtained utilizing NASCET criteria, using the distal internal carotid diameter as the denominator. CONTRAST:  50 cc Isovue 370 COMPARISON:  05/01/2018 CT head FINDINGS: CTA NECK FINDINGS Aortic arch: 4.2 cm ascending aortic aneurysm. Standard branching. Mild calcific atherosclerosis of the aortic arch. Right carotid system: No evidence of dissection, stenosis (50% or greater) or occlusion. Left carotid system: No evidence of dissection, stenosis (50% or greater) or occlusion. Vertebral arteries: Left dominance. No evidence of dissection, stenosis (50% or greater) or occlusion. Skeleton: C3-4 grade 1 retrolisthesis and C4-5 grade 1 anterolisthesis. Prominent bilateral upper cervical facet arthropathy. Discogenic degenerative changes with loss of disc space height at the C3-4 and C5-C7 levels.  Other neck: 10 mm nodule within the thyroid left lobe. Upper chest: Negative. Review of the MIP images confirms the above findings CTA HEAD FINDINGS Anterior circulation: Calcific atherosclerosis of the carotid siphons with moderate right and mild left paraclinoid stenosis. Posterior circulation: No significant stenosis, proximal occlusion, aneurysm, or vascular malformation. Venous sinuses: As permitted by contrast timing, patent. Anatomic variants: None significant. Delayed phase: No abnormal intracranial enhancement. Review of the MIP images confirms the above findings IMPRESSION: 1. Patent carotid and vertebral arteries. No dissection, aneurysm, or hemodynamically significant stenosis utilizing NASCET criteria. 2. Patent anterior and posterior intracranial circulation. No large vessel occlusion, aneurysm, or high-grade stenosis. 3. 4.2 cm ascending aortic aneurysm. Recommend annual imaging followup by CTA or MRA. This recommendation follows 2010 ACCF/AHA/AATS/ACR/ASA/SCA/SCAI/SIR/STS/SVM Guidelines for the Diagnosis and Management of Patients with Thoracic Aortic Disease. Circulation. 2010; 121: J287-O676. 4. Calcific atherosclerosis of the carotid siphons with moderate right and mild left paraclinoid stenosis. Electronically Signed   By: Kristine Garbe M.D.   On: 05/02/2018 03:30   Ct Head Wo Contrast  Result Date: 05/03/2018 CLINICAL DATA:  Followup stroke. Multiple small acute infarctions shown by MRI yesterday. EXAM: CT HEAD WITHOUT CONTRAST TECHNIQUE: Contiguous axial images were obtained from the base of the skull through the vertex without intravenous contrast. COMPARISON:  CT and MRI 05/02/2018  FINDINGS: Brain: No identifiable acute finding by CT. Mild age related atrophy. Chronic small-vessel ischemic changes of the hemispheric white matter as seen previously. No identifiable acute infarction, mass lesion, hemorrhage, hydrocephalus or extra-axial collection. Vascular: There is  atherosclerotic calcification of the major vessels at the base of the brain. Skull: Negative Sinuses/Orbits: Clear/normal Other: None IMPRESSION: No acute infarction by CT. Atrophy and chronic small-vessel disease. Numerous small acute infarctions throughout the brain shown by MRI yesterday cannot be visualized by CT. No evidence of hemorrhagic complication or swelling. Electronically Signed   By: Nelson Chimes M.D.   On: 05/03/2018 11:48   Ct Head Wo Contrast  Result Date: 05/01/2018 CLINICAL DATA:  84 year old female with stroke-like symptoms. EXAM: CT HEAD WITHOUT CONTRAST TECHNIQUE: Contiguous axial images were obtained from the base of the skull through the vertex without intravenous contrast. COMPARISON:  Head CT dated 06/15/2016 FINDINGS: Brain: There is mild age-related atrophy and chronic microvascular ischemic changes. A small area of old infarct and noted involving the anterior horn of the left internal capsule. There is no acute intracranial hemorrhage. No mass effect or midline shift. No extra-axial fluid collection. Vascular: No hyperdense vessel or unexpected calcification. Skull: Normal. Negative for fracture or focal lesion. Sinuses/Orbits: No acute finding. Other: None IMPRESSION: 1. No acute intracranial pathology. 2. Age-related atrophy and chronic microvascular ischemic changes. Electronically Signed   By: Anner Crete M.D.   On: 05/01/2018 23:26   Ct Angio Neck W Or Wo Contrast  Result Date: 05/02/2018 CLINICAL DATA:  84 y/o  F; left-sided weakness and slurred speech. EXAM: CT ANGIOGRAPHY HEAD AND NECK TECHNIQUE: Multidetector CT imaging of the head and neck was performed using the standard protocol during bolus administration of intravenous contrast. Multiplanar CT image reconstructions and MIPs were obtained to evaluate the vascular anatomy. Carotid stenosis measurements (when applicable) are obtained utilizing NASCET criteria, using the distal internal carotid diameter as the  denominator. CONTRAST:  50 cc Isovue 370 COMPARISON:  05/01/2018 CT head FINDINGS: CTA NECK FINDINGS Aortic arch: 4.2 cm ascending aortic aneurysm. Standard branching. Mild calcific atherosclerosis of the aortic arch. Right carotid system: No evidence of dissection, stenosis (50% or greater) or occlusion. Left carotid system: No evidence of dissection, stenosis (50% or greater) or occlusion. Vertebral arteries: Left dominance. No evidence of dissection, stenosis (50% or greater) or occlusion. Skeleton: C3-4 grade 1 retrolisthesis and C4-5 grade 1 anterolisthesis. Prominent bilateral upper cervical facet arthropathy. Discogenic degenerative changes with loss of disc space height at the C3-4 and C5-C7 levels. Other neck: 10 mm nodule within the thyroid left lobe. Upper chest: Negative. Review of the MIP images confirms the above findings CTA HEAD FINDINGS Anterior circulation: Calcific atherosclerosis of the carotid siphons with moderate right and mild left paraclinoid stenosis. Posterior circulation: No significant stenosis, proximal occlusion, aneurysm, or vascular malformation. Venous sinuses: As permitted by contrast timing, patent. Anatomic variants: None significant. Delayed phase: No abnormal intracranial enhancement. Review of the MIP images confirms the above findings IMPRESSION: 1. Patent carotid and vertebral arteries. No dissection, aneurysm, or hemodynamically significant stenosis utilizing NASCET criteria. 2. Patent anterior and posterior intracranial circulation. No large vessel occlusion, aneurysm, or high-grade stenosis. 3. 4.2 cm ascending aortic aneurysm. Recommend annual imaging followup by CTA or MRA. This recommendation follows 2010 ACCF/AHA/AATS/ACR/ASA/SCA/SCAI/SIR/STS/SVM Guidelines for the Diagnosis and Management of Patients with Thoracic Aortic Disease. Circulation. 2010; 121: I433-I951. 4. Calcific atherosclerosis of the carotid siphons with moderate right and mild left paraclinoid  stenosis. Electronically Signed   By:  Kristine Garbe M.D.   On: 05/02/2018 03:30   Mr Brain Wo Contrast  Result Date: 05/02/2018 CLINICAL DATA:  84 y/o  F; stroke for follow-up. EXAM: MRI HEAD WITHOUT CONTRAST TECHNIQUE: Multiplanar, multiecho pulse sequences of the brain and surrounding structures were obtained without intravenous contrast. COMPARISON:  05/01/2018 CT head.  05/02/2018 CTA head. FINDINGS: Brain: Subcentimeter foci of reduced diffusion are present within the left posterior frontal subcortical white matter, right caudate body, right putamen, right caudate head, right hemi pons, and bilateral cerebellar hemispheres compatible with acute/early subacute infarction. No associated hemorrhage or mass effect. Several nonspecific T2 FLAIR hyperintensities in subcortical and periventricular white matter are compatible with moderate chronic microvascular ischemic changes for age. Moderate volume loss of the brain. No extra-axial collection, hydrocephalus, or herniation. Small foci of susceptibility hypointensity are present in a predominantly central distribution within the basal ganglia compatible with hemosiderin deposition of chronic microhemorrhage. The central distribution favors hypertensive etiology. Vascular: Normal flow voids. Skull and upper cervical spine: Normal marrow signal. Sinuses/Orbits: Negative. Other: None. IMPRESSION: Subcentimeter foci of acute/early subacute infarction are present within the left posterior frontal subcortical white matter, right caudate body, right putamen, right caudate head, right hemi pons, and bilateral cerebellar hemispheres. No associated hemorrhage or mass effect. Multiple vascular territories probably represents embolic etiology. Electronically Signed   By: Kristine Garbe M.D.   On: 05/02/2018 04:16   Dg Swallowing Func-speech Pathology  Result Date: 05/02/2018 Objective Swallowing Evaluation: Type of Study: MBS-Modified Barium Swallow  Study  Patient Details Name: LESA VANDALL MRN: [id number] Date of Birth: 1926-08-30 Today's Date: 05/02/2018 Time: SLP Start Time (ACUTE ONLY): 1510 -SLP Stop Time (ACUTE ONLY): 1530 SLP Time Calculation (min) (ACUTE ONLY): 20 min Past Medical History: Past Medical History: Diagnosis Date . Anemia  . Arthritis   hands, knees . Breast cancer, left breast ([place]) 2003  s/p mastectomy, chemo.  Dr. Chancy Milroy oncologist . Fracture of humerus, proximal, right, closed 07/2010  s/p hemiarthroplasty . Gastric ulcer with hemorrhage 02/14/2013 . Helicobacter pylori (H. pylori) infection 02/15/2013  eradication confirmed 05/2013 [facility] . Hypertension  . Multiple duodenal ulcers 02/14/2013 . Osteoporosis  . Stage 3 chronic kidney disease [facility])  Past Surgical History: Past Surgical History: Procedure Laterality Date . ABDOMINAL HYSTERECTOMY   . BREATH TEK H PYLORI N/A 06/06/2013  Procedure: BREATH TEK H PYLORI;  Surgeon: Gatha Mayer, MD;  Location: [facility];  Service: Endoscopy;  Laterality: N/A; . ESOPHAGOGASTRODUODENOSCOPY N/A 02/14/2013  Procedure: ESOPHAGOGASTRODUODENOSCOPY (EGD);  Surgeon: Gatha Mayer, MD;  Location: [facility];  Service: Endoscopy;  Laterality: N/A; . FEMUR IM NAIL  04/13/2012  Procedure: INTRAMEDULLARY (IM) NAIL FEMORAL;  Surgeon: Melina Schools, MD;  Location: [facility] ORS;  Service: Orthopedics;  Laterality: Left; . HIP SURGERY   . KNEE ARTHROSCOPY Bilateral   [facility]ateral knees at [facility] . MASTECTOMY Left  . SHOULDER HEMI-ARTHROPLASTY Right 07/2010  Dr. Mardelle Matte HPI: DIM MEISINGER is a 84 y.o. female with medical history significant of dementia, HTN.  Patient presents to the ED with increased confusion, slurred speech, inability to ambulate, L>R sided weakness.  Some intermittent symptoms noticed a week ago. MRI reveals multiple acute embolic sub-cm strokes.  No data recorded Assessment / Plan / Recommendation CHL IP CLINICAL IMPRESSIONS 05/02/2018 Clinical Impression Pt demonstrates a mild oropharyngeal dysphagia with  no overt weakness. Prominent cricopharyngeus observed with trace backflow to pyriforms in addition to mild entrapment of solids and liquids in the valleculae and pyriforms. This results in trace silent  frank penetration which clears with spontanous and cued throat clearing. Suspect this is a baseline age related/primary cervical esophageal impairment. Recommend pt resumed a regular diet and thin liquids with additional aspiration precautions including cues to clear throat intermittently and to participate in consistent oral care. [name]  SLP Visit Diagnosis Frontal lobe and executive function deficit Attention and concentration deficit following -- Frontal lobe and executive function deficit following Cerebral infarction Impact on safety and function Mild aspiration risk   CHL IP TREATMENT RECOMMENDATION 05/02/2018 Treatment Recommendations Therapy as outlined in treatment plan below   Prognosis 05/02/2018 Prognosis for Safe Diet Advancement Good Barriers to Reach Goals -- Barriers/Prognosis Comment -- CHL IP DIET RECOMMENDATION 05/02/2018 SLP Diet Recommendations Regular solids;Thin liquid Liquid Administration via Cup;Straw Medication Administration Whole meds with puree Compensations Slow rate;Small sips/bites;Clear throat intermittently Postural Changes Seated upright at 90 degrees   CHL IP OTHER RECOMMENDATIONS 05/02/2018 Recommended Consults -- Oral Care Recommendations Oral care BID Other Recommendations --   CHL IP FOLLOW UP RECOMMENDATIONS 05/02/2018 Follow up Recommendations Skilled Nursing facility   [facility] IP FREQUENCY AND DURATION 05/02/2018 Speech Therapy Frequency (ACUTE ONLY) min 2x/week Treatment Duration 1 week      CHL IP ORAL PHASE 05/02/2018 Oral Phase WFL Oral - Pudding Teaspoon -- Oral - Pudding Cup -- Oral - Honey Teaspoon -- Oral - Honey Cup -- Oral - Nectar Teaspoon -- Oral - Nectar Cup -- Oral - Nectar Straw -- Oral - Thin Teaspoon -- Oral - Thin Cup -- Oral - Thin Straw -- Oral - Puree -- Oral - Mech  Soft -- Oral - Regular -- Oral - Multi-Consistency -- Oral - Pill -- Oral Phase - Comment --  CHL IP PHARYNGEAL PHASE 05/02/2018 Pharyngeal Phase Impaired Pharyngeal- Pudding Teaspoon -- Pharyngeal -- Pharyngeal- Pudding Cup -- Pharyngeal -- Pharyngeal- Honey Teaspoon -- Pharyngeal -- Pharyngeal- Honey Cup -- Pharyngeal -- Pharyngeal- Nectar Teaspoon -- Pharyngeal -- Pharyngeal- Nectar Cup -- Pharyngeal -- Pharyngeal- Nectar Straw -- Pharyngeal -- Pharyngeal- Thin Teaspoon -- Pharyngeal -- Pharyngeal- Thin Cup Pharyngeal residue - valleculae;Pharyngeal residue - pyriform;Penetration/Apiration after swallow Pharyngeal Material enters airway, remains ABOVE vocal cords and not ejected out;Material enters airway, CONTACTS cords and then ejected out;Material does not enter airway Pharyngeal- Thin Straw Penetration/Apiration after swallow;Penetration/Aspiration during swallow;Pharyngeal residue - valleculae;Pharyngeal residue - pyriform;Pharyngeal residue - cp segment Pharyngeal Material enters airway, CONTACTS cords and not ejected out;Material enters airway, CONTACTS cords and then ejected out;Material does not enter airway Pharyngeal- Puree Pharyngeal residue - valleculae Pharyngeal -- Pharyngeal- Mechanical Soft -- Pharyngeal -- Pharyngeal- Regular Pharyngeal residue - valleculae Pharyngeal -- Pharyngeal- Multi-consistency -- Pharyngeal -- Pharyngeal- Pill -- Pharyngeal -- Pharyngeal Comment --  No flowsheet data found. Herbie [place], [place] CCC-SLP [phone] Othelia Pulling Katherene Ponto 05/02/2018, 3:48 PM               Time Spent in minutes 35   Khamiyah Grefe M.D on 05/06/2018 at 2:15 PM  Between 7am to 7pm - Pager - [phone]  After 7pm go to [email or web address] - password [email or web address]  [name] -  Office  [phone]

## 2018-05-06 NOTE — Progress Notes (Signed)
Inpatient Rehabilitation Admissions Coordinator  Notified by Dr. Clementeen Graham that peer to peer has been completed and denial for CIR remains. I have updated daughter, [name], by phone and she is aware that SNF is now recommended. She verifies that she is main contact. I have notified Isabel, SW, and we sill sign off at this time.  Danne Baxter, RN, MSN Rehab Admissions Coordinator [phone] 05/06/2018 9:40 AM

## 2018-05-06 NOTE — Clinical Social Work Note (Signed)
Clinical Social Work Assessment  Patient Details  Name: Katie Silva MRN: [id number] Date of Birth: 1926-06-09  Date of referral:                  Reason for consult:  Facility Placement, Discharge Planning                Permission sought to share information with:  Family Supports, Customer service manager Permission granted to share information::  No(pt only oriented to self)  Name::     Katie Silva  Agency::  [facility]  Relationship::  daughter  Contact Information:  [phone]  Housing/Transportation Living arrangements for the past 2 months:  [place] of Information:  Adult Children Patient Interpreter Needed:  None Criminal Activity/Legal Involvement Pertinent to Current Situation/Hospitalization:  No - Comment as needed Significant Relationships:  Adult Children, Other Family Members Lives with:  Self Do you feel safe going back to the place where you live?  Yes Need for family participation in patient care:  Yes (Comment)  Care giving concerns:  Pt requiring major assist with IADLs and ADLs, has four children who provide significant assistance but are unable to take pt home with 24/7 assistance with current functional status. Pt children interested in SNF now that CIR has been denied by pt's insurance.    Social Worker assessment / plan: CSW received handoff from CIR that pt has been denied for CIR admit. Pt daughter [name] is the preferred contact. CSW spoke with her via telephone. Pt has four children who have been assisting with pt as she has been getting older and losing some of her regular function. Pt has been to SNF before at [facility] "about 7 years ago" following an orthopaedic surgery. Pt family recognizes that pt may require therapy longer than is initially covered by insurance and state that they have paid for that previously. Pt daughter states that she and her siblings are interested in [facility]  (due to proximity to majority of pt children), and [facility].   CSW will reach out to both SNF's and submit clinicals for insurance auth in anticipation of possible discharge over the holiday weekend.    Employment status:  Retired Nurse, adult PT Recommendations:  [place], [place] / Referral to community resources:  [place]  Patient/Family's Response to care: Pt understanding of pt needs, denial from CIR and need to authorize a SNF for possible discharge over/following the weekend pt family are understanding of CSW role and SNF referral process.   Patient/Family's Understanding of and Emotional Response to Diagnosis, Current Treatment, and Prognosis:  Pt daughter states understanding of pt diagnosis, current treatment and prognosis. Pt daughter is emotionally appropriate, hopeful for preferred SNFs, and expresses good expectations for pt progress and current needs given pt age and current functional status.   Emotional Assessment Appearance:  Appears stated age Attitude/Demeanor/Rapport:  Unable to Assess Affect (typically observed):  Unable to Assess Orientation:  Oriented to Self, Fluctuating Orientation (Suspected and/or reported Sundowners) Alcohol / Substance use:  Not Applicable Psych involvement (Current and /or in the community):  No (Comment)  Discharge Needs  Concerns to be addressed:  Care Coordination Readmission within the last 30 days:  No Current discharge risk:  Cognitively Impaired, Dependent with Mobility Barriers to Discharge:  Ship broker, Continued Medical Work up   [employer], [place] 05/06/2018, 10:06 AM

## 2018-05-06 NOTE — NC FL2 (Signed)
[place] LEVEL OF CARE SCREENING TOOL     IDENTIFICATION  Patient Name: Katie Silva Birthdate: 1926-08-21 Sex: female Admission Date (Current Location): 05/01/2018  [facility] and [place] Number:  Herbalist and Address:  [facility], [place] [address] [place], [place] [address]      Provider Number: [id number]  Attending Physician Name and Address:  Louellen Molder, MD  Relative Name and Phone Number:  [name]; daughter; [phone]    Current Level of Care: Hospital Recommended Level of Care: [place] Prior Approval Number:    Date Approved/Denied:   PASRR Number: [id number]  Discharge Plan: SNF    Current Diagnoses: Patient Active Problem List   Diagnosis Date Noted  . New onset atrial fibrillation ([place]) 05/02/2018  . Acute cardioembolic stroke ([place]) 62/22/9798  . Atrial fibrillation with RVR ([place])   . Cerebrovascular accident (CVA) due to embolism of precerebral artery ([place])   . Diastolic dysfunction   . Benign essential HTN   . Stage 3 chronic kidney disease ([place])   . Urinary frequency 11/29/2017  . Encounter for completion of form with patient 05/31/2017  . Pedal edema 12/31/2015  . Dementia 03/14/2015  . Postoperative anemia 04/14/2012  . SVT (supraventricular tachycardia) ([place]) 04/14/2012  . Closed left hip fracture ([place]) 04/13/2012  . HTN (hypertension) 01/30/2011  . Breast cancer, left breast ([place])   . Osteoporosis   . Fracture of humerus, proximal, right, closed 07/08/2010    Orientation RESPIRATION BLADDER Height & Weight     Self  Normal Continent, External catheter Weight: 243 lb 13.3 oz (110.6 kg) Height:  5\' 6"  (167.6 cm)  BEHAVIORAL SYMPTOMS/MOOD NEUROLOGICAL BOWEL NUTRITION STATUS      Continent Diet(see discharge summary)  AMBULATORY STATUS COMMUNICATION OF NEEDS Skin   Extensive Assist Verbally Other (Comment)(skin tear on left elbow with foam)                      Personal Care Assistance Level of Assistance  Bathing, Feeding, Dressing Bathing Assistance: Maximum assistance Feeding assistance: Limited assistance Dressing Assistance: Maximum assistance     Functional Limitations Info  Sight, Hearing, Speech Sight Info: Adequate Hearing Info: Adequate Speech Info: Adequate    SPECIAL CARE FACTORS FREQUENCY  PT (By licensed PT), OT (By licensed OT)     PT Frequency: 5x week OT Frequency: 5x week            Contractures Contractures Info: Not present    Additional Factors Info  Code Status, Allergies Code Status Info: DNR Allergies Info: FEXOFENADINE HCL, ARICEPT DONEPEZIL HCL, PENICILLINS, TYLENOL ACETAMINOPHEN            Current Medications (05/06/2018):  This is the current hospital active medication list Current Facility-Administered Medications  Medication Dose Route Frequency Provider Last Rate Last Dose  .  stroke: mapping our early stages of recovery book   Does not apply Once Alcario Drought, Jared M, DO      . 0.9 %  sodium chloride infusion   Intravenous Continuous Rosalin Hawking, MD 50 mL/hr at 05/05/18 1600    . acetaminophen (TYLENOL) tablet 650 mg  650 mg Oral Q4H PRN Etta Quill, DO   650 mg at 05/06/18 9211   Or  . acetaminophen (TYLENOL) solution 650 mg  650 mg Per Tube Q4H PRN Etta Quill, DO       Or  . acetaminophen (TYLENOL) suppository 650 mg  650 mg  Rectal Q4H PRN Etta Quill, DO      . apixaban Arne Cleveland) tablet 5 mg  5 mg Oral BID Caren Griffins, MD   5 mg at 05/06/18 0911  . haloperidol lactate (HALDOL) injection 1 mg  1 mg Intravenous Q6H PRN Opyd, Ilene Qua, MD   1 mg at 05/02/18 2118  . metoprolol tartrate (LOPRESSOR) tablet 12.5 mg  12.5 mg Oral BID Elodia Florence., MD   Stopped at 05/06/18 0900  . pravastatin (PRAVACHOL) tablet 20 mg  20 mg Oral q1800 Rosalin Hawking, MD   20 mg at 05/05/18 1740  . traMADol (ULTRAM) tablet 50 mg  50 mg Oral Once Antonietta Breach, PA-C      . traZODone  (DESYREL) tablet 25 mg  25 mg Oral QHS PRN Elodia Florence., MD   25 mg at 05/05/18 2029     Discharge Medications: Please see discharge summary for a list of discharge medications.  Relevant Imaging Results:  Relevant Lab Results:   Additional Information [id number] [place], [place]

## 2018-05-06 NOTE — Social Work (Signed)
Pt family has chosen [facility], they are aware that it would not be able to be a LTC placement. Pt has received insurance authorization through [facility], would be able to transfer whenever medically appropriate.   [place], [place] Work [phone]

## 2018-05-06 NOTE — Care Management Important Message (Signed)
Important Message  Patient Details  Name: Katie Silva MRN: [id number] Date of Birth: June 20, 1926   Medicare Important Message Given:  Yes    Ella Bodo, RN 05/06/2018, 4:48 PM

## 2018-05-06 NOTE — Progress Notes (Signed)
Inpatient Rehabilitation Admissions Coordinator  I have contacted Dr. Clementeen Graham as well as son at bedside that insurance has currently denied inpt rehab admission but wish for a peer to peer with Attending MD which I have arranged for today. If denial not overturned, patient will need SNF. I will follow up with final determination today.   Danne Baxter, RN, MSN Rehab Admissions Coordinator [phone] 05/06/2018 8:44 AM'

## 2018-05-07 DIAGNOSIS — N183 Chronic kidney disease, stage 3 (moderate): Secondary | ICD-10-CM | POA: Diagnosis not present

## 2018-05-07 DIAGNOSIS — I6310 Cerebral infarction due to embolism of unspecified precerebral artery: Secondary | ICD-10-CM | POA: Diagnosis not present

## 2018-05-07 DIAGNOSIS — R1312 Dysphagia, oropharyngeal phase: Secondary | ICD-10-CM | POA: Diagnosis not present

## 2018-05-07 DIAGNOSIS — R531 Weakness: Secondary | ICD-10-CM | POA: Diagnosis not present

## 2018-05-07 DIAGNOSIS — I5189 Other ill-defined heart diseases: Secondary | ICD-10-CM | POA: Diagnosis not present

## 2018-05-07 DIAGNOSIS — F0390 Unspecified dementia without behavioral disturbance: Secondary | ICD-10-CM | POA: Diagnosis not present

## 2018-05-07 DIAGNOSIS — M810 Age-related osteoporosis without current pathological fracture: Secondary | ICD-10-CM | POA: Diagnosis not present

## 2018-05-07 DIAGNOSIS — G459 Transient cerebral ischemic attack, unspecified: Secondary | ICD-10-CM | POA: Diagnosis not present

## 2018-05-07 DIAGNOSIS — I639 Cerebral infarction, unspecified: Secondary | ICD-10-CM | POA: Diagnosis not present

## 2018-05-07 DIAGNOSIS — I10 Essential (primary) hypertension: Secondary | ICD-10-CM | POA: Diagnosis not present

## 2018-05-07 DIAGNOSIS — I509 Heart failure, unspecified: Secondary | ICD-10-CM | POA: Diagnosis not present

## 2018-05-07 DIAGNOSIS — I4891 Unspecified atrial fibrillation: Secondary | ICD-10-CM | POA: Diagnosis not present

## 2018-05-07 DIAGNOSIS — Z7401 Bed confinement status: Secondary | ICD-10-CM | POA: Diagnosis not present

## 2018-05-07 DIAGNOSIS — M2550 Pain in unspecified joint: Secondary | ICD-10-CM | POA: Diagnosis not present

## 2018-05-07 MED ORDER — TRAZODONE HCL 50 MG PO TABS: 25.0000 mg | ORAL_TABLET | Freq: Every evening | ORAL | 0 refills | Status: DC | PRN

## 2018-05-07 MED ORDER — METOPROLOL TARTRATE 25 MG PO TABS: 12.5000 mg | ORAL_TABLET | Freq: Two times a day (BID) | ORAL | 0 refills | Status: DC

## 2018-05-07 MED ORDER — METHOCARBAMOL 1000 MG/10ML IJ SOLN
500.0000 mg | Freq: Once | INTRAVENOUS | Status: AC
  Administered 2018-05-07: 500 mg via INTRAVENOUS
  Filled 2018-05-07 (×2): qty 5

## 2018-05-07 MED ORDER — APIXABAN 5 MG PO TABS: 5.0000 mg | ORAL_TABLET | Freq: Two times a day (BID) | ORAL | 0 refills | Status: DC

## 2018-05-07 MED ORDER — PRAVASTATIN SODIUM 20 MG PO TABS: 20.0000 mg | ORAL_TABLET | Freq: Every day | ORAL | 0 refills | Status: DC

## 2018-05-07 NOTE — Progress Notes (Signed)
CSW spoke to [place] has been accepted by: [facility] SNF Number for report is: [phone] Pt's unit/room/bed number will be: 104-A Accepting physician: SNF Physician   Pt can arrive ASAP on 05/07/18  CSW will update RN.  Alphonse Guild. Sosha Shepherd, LCSW, LCAS, CSI Clinical Social Worker Ph: [phone]

## 2018-05-07 NOTE — Evaluation (Addendum)
Clinical/Bedside Swallow Evaluation Patient Details  Name: Katie Silva MRN: [id number] Date of Birth: [birth year]  Today's Date: 05/07/2018 Time: SLP Start Time (ACUTE ONLY): 0840 SLP Stop Time (ACUTE ONLY): 0855 SLP Time Calculation (min) (ACUTE ONLY): 15 min  Past Medical History:  Past Medical History:  Diagnosis Date  . Anemia   . Arthritis    hands, knees  . Breast cancer, left breast ([place]) 2003   s/p mastectomy, chemo.  Dr. Chancy Milroy oncologist  . Fracture of humerus, proximal, right, closed 07/2010   s/p hemiarthroplasty  . Gastric ulcer with hemorrhage 02/14/2013  . Helicobacter pylori (H. pylori) infection 02/15/2013   eradication confirmed 05/2013 [facility]  . Hypertension   . Multiple duodenal ulcers 02/14/2013  . Osteoporosis   . Stage 3 chronic kidney disease [facility])    Past Surgical History:  Past Surgical History:  Procedure Laterality Date  . ABDOMINAL HYSTERECTOMY    . BREATH TEK H PYLORI N/A 06/06/2013   Procedure: BREATH TEK H PYLORI;  Surgeon: Gatha Mayer, MD;  Location: [facility] ENDOSCOPY;  Service: Endoscopy;  Laterality: N/A;  . ESOPHAGOGASTRODUODENOSCOPY N/A 02/14/2013   Procedure: ESOPHAGOGASTRODUODENOSCOPY (EGD);  Surgeon: Gatha Mayer, MD;  Location: [facility];  Service: Endoscopy;  Laterality: N/A;  . FEMUR IM NAIL  04/13/2012   Procedure: INTRAMEDULLARY (IM) NAIL FEMORAL;  Surgeon: Melina Schools, MD;  Location: [facility] ORS;  Service: Orthopedics;  Laterality: Left;  . HIP SURGERY    . KNEE ARTHROSCOPY Bilateral    [facility]  . MASTECTOMY Left   . SHOULDER HEMI-ARTHROPLASTY Right 07/2010   Dr. Mardelle Matte   HPI:  Katie Silva is a 84 y.o. female with medical history significant of dementia, HTN.  Patient presents to the ED with increased confusion, slurred speech, inability to ambulate, L>R sided weakness.  Some intermittent symptoms noticed a week ago. MRI reveals multiple acute embolic sub-cm strokes. Had MBS this admission which recommended  regular/thin; was made NPO due to lethargy and concern for aspiration. Swallow evaluation re-ordered. CXR revealed no acute findings.  Assessment / Plan / Recommendation Clinical Impression   Patient presents with swallow function appears consistent with MBS performed 05/02/18. Pt alert and able to self-feed, with some assistance required retrieving heavier items from her table. She is mildly distractible, but with min cues maintains attention to POs. There were 2 instances of wet vocal quality with thin liquids. Pt initiated a spontaneous throat clear in one of these instances, and SLP cued verbally for the second, with improvements in vocal quality. SLP educated daughter re: MBS findings and swallow precautions. Recommend she resume regular diet with thin liquids, continue swallow precautions: ensure pt fully alert and upright, minimize distractions, clear throat occasionally. Will follow briefly for tolerance. Pt may benefit from skilled ST in next level of care to reinforce swallow precautions.    SLP Visit Diagnosis: Dysphagia, oropharyngeal phase (R13.12)    Aspiration Risk  Mild aspiration risk    Diet Recommendation Regular;Thin liquid   Liquid Administration via: Cup;Straw Medication Administration: Whole meds with puree Supervision: Staff to assist with self feeding Compensations: Slow rate;Small sips/bites;Clear throat intermittently Postural Changes: Seated upright at 90 degrees    Other  Recommendations Oral Care Recommendations: Oral care BID   Follow up Recommendations Skilled Nursing facility      Frequency and Duration min 1 x/week  1 week       Prognosis Prognosis for Safe Diet Advancement: Good      Swallow Study  General Date of Onset: 05/01/18 HPI: Katie Silva is a 84 y.o. female with medical history significant of dementia, HTN.  Patient presents to the ED with increased confusion, slurred speech, inability to ambulate, L>R sided weakness.  Some  intermittent symptoms noticed a week ago. MRI reveals multiple acute embolic sub-cm strokes. Had MBS this admission which recommended regular/thin; was made NPO due to lethargy and concern for aspiration. Swallow evaluation re-ordered. CXR revealed no acute findings. Type of Study: Bedside Swallow Evaluation Previous Swallow Assessment: MBS this admission revealed mild oropharyngeal dysphagia with silent penetration, clears with spontaneous or cued throat clear Diet Prior to this Study: NPO Temperature Spikes Noted: No Respiratory Status: Room air History of Recent Intubation: No Behavior/Cognition: Alert;Cooperative;Pleasant mood Oral Cavity Assessment: Within Functional Limits Oral Care Completed by SLP: No Oral Cavity - Dentition: Dentures, top;Dentures, bottom Vision: Functional for self-feeding Self-Feeding Abilities: Able to feed self;Needs assist Patient Positioning: Upright in bed Baseline Vocal Quality: Hoarse Volitional Cough: Strong Volitional Swallow: Able to elicit    Oral/Motor/Sensory Function Overall Oral Motor/Sensory Function: Mild impairment Facial ROM: Reduced left;Suspected CN VII (facial) dysfunction Facial Symmetry: Abnormal symmetry left;Suspected CN VII (facial) dysfunction Facial Strength: Reduced left;Suspected CN VII (facial) dysfunction Facial Sensation: Within Functional Limits Lingual ROM: Within Functional Limits Lingual Symmetry: Within Functional Limits Lingual Strength: Within Functional Limits Lingual Sensation: Within Functional Limits Velum: Within Functional Limits Mandible: Within Functional Limits   Ice Chips Ice chips: Not tested   Thin Liquid Thin Liquid: Impaired Presentation: Cup;Straw Pharyngeal  Phase Impairments: Throat Clearing - Immediate;Wet Vocal Quality    Nectar Thick Nectar Thick Liquid: Not tested   Honey Thick Honey Thick Liquid: Not tested   Puree Puree: Within functional limits Presentation: Spoon;Self Fed   [place], [place], [place] Pager: [phone] Office: [phone]  Solid: Within functional limits Presentation: Self Fed      Katie Silva 05/07/2018,9:14 AM

## 2018-05-07 NOTE — Discharge Summary (Addendum)
Physician Discharge Summary  Katie Silva ZOX:[id number] DOB: 01-05-1926 DOA: 05/01/2018  PCP: Lucille Passy, MD  Admit date: 05/01/2018 Discharge date: 05/07/2018  Admitted From: Home  Disposition: SNF  Recommendations for Outpatient Follow-up:  1. Follow up with PCP in 1-2 weeks 2. Please obtain BMP/CBC in one week 3. Follow up with neurology in 4 weeks.  4. Needs continue PT and speech evaluation.   Discharge Condition: stable.  CODE STATUS: DNR Diet recommendation: Regular diet   Brief/Interim Summary: Brief Narrative   84 year old female with history of mild dementia, hypertension, independent at home presented to the hospital on 8/26 with increased confusion with slurred speech and left-sided weakness.  Work-up showing embolic stroke and also found to have paroxysmal A. Fib.   Acute cardioembolic stroke (HCC) Suspected due to newly diagnosed A. fib.  2D echo with normal EF and grade 1 diastolic dysfunction.  A1c of 5.2. LDL of 85.  Continue pravastatin. Resultant left side weakness, dysarthria.  continue with Eliquis.  Had delirium and somnolence due to medications.  per granddaughter and daughter  patient has not been able to move left LE after stroke. Per family left upper extremity strength has improved some.  Patient is alert, today and following command.   Acute metabolic encephalopathy Suspect delirium delirium due to her acute stroke worsening her dementia.  No signs of infection.   Chest x ray negative.  Repeat swallow eval. Earlier during hospitalization received haldol and ativan that made her sleepy.  Alert today.   Mild oropharyngeal dysphasia Continue aspiration precaution. Alert today. Pass swallow evaluation.   New onset atrial fibrillation CHADS 2 Vasc score of  of 6 .  Started on Eliquis and low-dose beta-blocker.  Essential hypertension Allow permissive blood pressure.  CKD stage III Stable.      Discharge Diagnoses:  Principal  Problem:   Acute cardioembolic stroke [facility]) Active Problems:   HTN (hypertension)   Dementia   New onset atrial fibrillation (HCC)   Atrial fibrillation with RVR ([place])   Cerebrovascular accident (CVA) due to embolism of precerebral artery (HCC)   Diastolic dysfunction   Benign essential HTN   Stage 3 chronic kidney disease [facility])    Discharge Instructions  Discharge Instructions    Ambulatory referral to Neurology   Complete by:  As directed    Follow up with stroke clinic NP (Jessica Vanschaick or Cecille Rubin, if both not available, consider Zachery Dauer, or Ahern) at [facility] in about 4 weeks. Thanks.   Diet - low sodium heart healthy   Complete by:  As directed    Increase activity slowly   Complete by:  As directed      Allergies as of 05/07/2018      Reactions   Fexofenadine Hcl Other (See Comments)   Doesn't agree with patient   Aricept [donepezil Hcl] Other (See Comments)   nightmares   Penicillins Other (See Comments)   Pt does not remember reaction to penicillin ("did not work")   Tylenol [acetaminophen] Other (See Comments)   "funny feeling in head."      Medication List    TAKE these medications   apixaban 5 MG Tabs tablet Commonly known as:  ELIQUIS Take 1 tablet (5 mg total) by mouth 2 (two) times daily.   lisinopril 10 MG tablet Commonly known as:  PRINIVIL,ZESTRIL Take 1 tablet (10 mg total) by mouth daily.   metoprolol tartrate 25 MG tablet Commonly known as:  LOPRESSOR Take 0.5 tablets (12.5 mg total)  by mouth 2 (two) times daily.   multivitamin with minerals Tabs tablet Take 1 tablet by mouth daily.   OSTEO BI-FLEX JOINT SHIELD PO Take 1 tablet by mouth daily.   pravastatin 20 MG tablet Commonly known as:  PRAVACHOL Take 1 tablet (20 mg total) by mouth daily at 6 PM.   traZODone 50 MG tablet Commonly known as:  DESYREL Take 0.5 tablets (25 mg total) by mouth at bedtime as needed for sleep (or agitation).       Contact information  for follow-up providers    [facility]. Schedule an appointment as soon as possible for a visit in 4 week(s).   Specialty:  Radiology Contact information: [address] [place] [phone]           Contact information for after-discharge care    Destination    [facility] Preferred SNF .   Service:  Skilled Nursing Contact information: [place] [phone]                 Allergies  Allergen Reactions  . Fexofenadine Hcl Other (See Comments)    Doesn't agree with patient  . Aricept [Donepezil Hcl] Other (See Comments)    nightmares  . Penicillins Other (See Comments)    Pt does not remember reaction to penicillin ("did not work")  . Tylenol [Acetaminophen] Other (See Comments)    "funny feeling in head."    Consultations: Neurology   Procedures/Studies: Ct Angio Head W Or Wo Contrast  Result Date: 05/02/2018 CLINICAL DATA:  84 y/o  F; left-sided weakness and slurred speech. EXAM: CT ANGIOGRAPHY HEAD AND NECK TECHNIQUE: Multidetector CT imaging of the head and neck was performed using the standard protocol during bolus administration of intravenous contrast. Multiplanar CT image reconstructions and MIPs were obtained to evaluate the vascular anatomy. Carotid stenosis measurements (when applicable) are obtained utilizing NASCET criteria, using the distal internal carotid diameter as the denominator. CONTRAST:  50 cc Isovue 370 COMPARISON:  05/01/2018 CT head FINDINGS: CTA NECK FINDINGS Aortic arch: 4.2 cm ascending aortic aneurysm. Standard branching. Mild calcific atherosclerosis of the aortic arch. Right carotid system: No evidence of dissection, stenosis (50% or greater) or occlusion. Left carotid system: No evidence of dissection, stenosis (50% or greater) or occlusion. Vertebral arteries: Left dominance. No evidence of dissection,  stenosis (50% or greater) or occlusion. Skeleton: C3-4 grade 1 retrolisthesis and C4-5 grade 1 anterolisthesis. Prominent bilateral upper cervical facet arthropathy. Discogenic degenerative changes with loss of disc space height at the C3-4 and C5-C7 levels. Other neck: 10 mm nodule within the thyroid left lobe. Upper chest: Negative. Review of the MIP images confirms the above findings CTA HEAD FINDINGS Anterior circulation: Calcific atherosclerosis of the carotid siphons with moderate right and mild left paraclinoid stenosis. Posterior circulation: No significant stenosis, proximal occlusion, aneurysm, or vascular malformation. Venous sinuses: As permitted by contrast timing, patent. Anatomic variants: None significant. Delayed phase: No abnormal intracranial enhancement. Review of the MIP images confirms the above findings IMPRESSION: 1. Patent carotid and vertebral arteries. No dissection, aneurysm, or hemodynamically significant stenosis utilizing NASCET criteria. 2. Patent anterior and posterior intracranial circulation. No large vessel occlusion, aneurysm, or high-grade stenosis. 3. 4.2 cm ascending aortic aneurysm. Recommend annual imaging followup by CTA or MRA. This recommendation follows 2010 ACCF/AHA/AATS/ACR/ASA/SCA/SCAI/SIR/STS/SVM Guidelines for the Diagnosis and Management of Patients with Thoracic Aortic Disease. Circulation. 2010; 121: H962-I297. 4. Calcific atherosclerosis of the  carotid siphons with moderate right and mild left paraclinoid stenosis. Electronically Signed   By: Kristine Garbe M.D.   On: 05/02/2018 03:30   Ct Head Wo Contrast  Result Date: 05/03/2018 CLINICAL DATA:  Followup stroke. Multiple small acute infarctions shown by MRI yesterday. EXAM: CT HEAD WITHOUT CONTRAST TECHNIQUE: Contiguous axial images were obtained from the base of the skull through the vertex without intravenous contrast. COMPARISON:  CT and MRI 05/02/2018 FINDINGS: Brain: No identifiable acute  finding by CT. Mild age related atrophy. Chronic small-vessel ischemic changes of the hemispheric white matter as seen previously. No identifiable acute infarction, mass lesion, hemorrhage, hydrocephalus or extra-axial collection. Vascular: There is atherosclerotic calcification of the major vessels at the base of the brain. Skull: Negative Sinuses/Orbits: Clear/normal Other: None IMPRESSION: No acute infarction by CT. Atrophy and chronic small-vessel disease. Numerous small acute infarctions throughout the brain shown by MRI yesterday cannot be visualized by CT. No evidence of hemorrhagic complication or swelling. Electronically Signed   By: Nelson Chimes M.D.   On: 05/03/2018 11:48   Ct Head Wo Contrast  Result Date: 05/01/2018 CLINICAL DATA:  84 year old female with stroke-like symptoms. EXAM: CT HEAD WITHOUT CONTRAST TECHNIQUE: Contiguous axial images were obtained from the base of the skull through the vertex without intravenous contrast. COMPARISON:  Head CT dated 06/15/2016 FINDINGS: Brain: There is mild age-related atrophy and chronic microvascular ischemic changes. A small area of old infarct and noted involving the anterior horn of the left internal capsule. There is no acute intracranial hemorrhage. No mass effect or midline shift. No extra-axial fluid collection. Vascular: No hyperdense vessel or unexpected calcification. Skull: Normal. Negative for fracture or focal lesion. Sinuses/Orbits: No acute finding. Other: None IMPRESSION: 1. No acute intracranial pathology. 2. Age-related atrophy and chronic microvascular ischemic changes. Electronically Signed   By: Anner Crete M.D.   On: 05/01/2018 23:26   Ct Angio Neck W Or Wo Contrast  Result Date: 05/02/2018 CLINICAL DATA:  84 y/o  F; left-sided weakness and slurred speech. EXAM: CT ANGIOGRAPHY HEAD AND NECK TECHNIQUE: Multidetector CT imaging of the head and neck was performed using the standard protocol during bolus administration of  intravenous contrast. Multiplanar CT image reconstructions and MIPs were obtained to evaluate the vascular anatomy. Carotid stenosis measurements (when applicable) are obtained utilizing NASCET criteria, using the distal internal carotid diameter as the denominator. CONTRAST:  50 cc Isovue 370 COMPARISON:  05/01/2018 CT head FINDINGS: CTA NECK FINDINGS Aortic arch: 4.2 cm ascending aortic aneurysm. Standard branching. Mild calcific atherosclerosis of the aortic arch. Right carotid system: No evidence of dissection, stenosis (50% or greater) or occlusion. Left carotid system: No evidence of dissection, stenosis (50% or greater) or occlusion. Vertebral arteries: Left dominance. No evidence of dissection, stenosis (50% or greater) or occlusion. Skeleton: C3-4 grade 1 retrolisthesis and C4-5 grade 1 anterolisthesis. Prominent bilateral upper cervical facet arthropathy. Discogenic degenerative changes with loss of disc space height at the C3-4 and C5-C7 levels. Other neck: 10 mm nodule within the thyroid left lobe. Upper chest: Negative. Review of the MIP images confirms the above findings CTA HEAD FINDINGS Anterior circulation: Calcific atherosclerosis of the carotid siphons with moderate right and mild left paraclinoid stenosis. Posterior circulation: No significant stenosis, proximal occlusion, aneurysm, or vascular malformation. Venous sinuses: As permitted by contrast timing, patent. Anatomic variants: None significant. Delayed phase: No abnormal intracranial enhancement. Review of the MIP images confirms the above findings IMPRESSION: 1. Patent carotid and vertebral arteries. No dissection, aneurysm, or hemodynamically  significant stenosis utilizing NASCET criteria. 2. Patent anterior and posterior intracranial circulation. No large vessel occlusion, aneurysm, or high-grade stenosis. 3. 4.2 cm ascending aortic aneurysm. Recommend annual imaging followup by CTA or MRA. This recommendation follows 2010  ACCF/AHA/AATS/ACR/ASA/SCA/SCAI/SIR/STS/SVM Guidelines for the Diagnosis and Management of Patients with Thoracic Aortic Disease. Circulation. 2010; 121: I778-E423. 4. Calcific atherosclerosis of the carotid siphons with moderate right and mild left paraclinoid stenosis. Electronically Signed   By: Kristine Garbe M.D.   On: 05/02/2018 03:30   Mr Brain Wo Contrast  Result Date: 05/02/2018 CLINICAL DATA:  84 y/o  F; stroke for follow-up. EXAM: MRI HEAD WITHOUT CONTRAST TECHNIQUE: Multiplanar, multiecho pulse sequences of the brain and surrounding structures were obtained without intravenous contrast. COMPARISON:  05/01/2018 CT head.  05/02/2018 CTA head. FINDINGS: Brain: Subcentimeter foci of reduced diffusion are present within the left posterior frontal subcortical white matter, right caudate body, right putamen, right caudate head, right hemi pons, and bilateral cerebellar hemispheres compatible with acute/early subacute infarction. No associated hemorrhage or mass effect. Several nonspecific T2 FLAIR hyperintensities in subcortical and periventricular white matter are compatible with moderate chronic microvascular ischemic changes for age. Moderate volume loss of the brain. No extra-axial collection, hydrocephalus, or herniation. Small foci of susceptibility hypointensity are present in a predominantly central distribution within the basal ganglia compatible with hemosiderin deposition of chronic microhemorrhage. The central distribution favors hypertensive etiology. Vascular: Normal flow voids. Skull and upper cervical spine: Normal marrow signal. Sinuses/Orbits: Negative. Other: None. IMPRESSION: Subcentimeter foci of acute/early subacute infarction are present within the left posterior frontal subcortical white matter, right caudate body, right putamen, right caudate head, right hemi pons, and bilateral cerebellar hemispheres. No associated hemorrhage or mass effect. Multiple vascular territories  probably represents embolic etiology. Electronically Signed   By: Kristine Garbe M.D.   On: 05/02/2018 04:16   Dg Chest Port 1 View  Result Date: 05/06/2018 CLINICAL DATA:  Dysphagia.  Acute encephalopathy. EXAM: PORTABLE CHEST 1 VIEW COMPARISON:  06/15/2016 FINDINGS: The cardiac silhouette is mildly enlarged. There is dense mitral annular calcification. No airspace consolidation, edema, pleural effusion, pneumothorax is identified. Left axillary surgical clips and right shoulder arthroplasty are noted. Contrast material is noted in colonic diverticula at the splenic flexure. IMPRESSION: No active disease. Electronically Signed   By: Logan Bores M.D.   On: 05/06/2018 16:26   Dg Swallowing Func-speech Pathology  Result Date: 05/02/2018 Objective Swallowing Evaluation: Type of Study: MBS-Modified Barium Swallow Study  Patient Details Name: Katie Silva MRN: [id number] Date of Birth: [birth year] Today's Date: 05/02/2018 Time: SLP Start Time (ACUTE ONLY): 1510 -SLP Stop Time (ACUTE ONLY): 1530 SLP Time Calculation (min) (ACUTE ONLY): 20 min Past Medical History: Past Medical History: Diagnosis Date . Anemia  . Arthritis   hands, knees . Breast cancer, left breast ([place]) 2003  s/p mastectomy, chemo.  Dr. Chancy Milroy oncologist . Fracture of humerus, proximal, right, closed 07/2010  s/p hemiarthroplasty . Gastric ulcer with hemorrhage 02/14/2013 . Helicobacter pylori (H. pylori) infection 02/15/2013  eradication confirmed 05/2013 [facility] . Hypertension  . Multiple duodenal ulcers 02/14/2013 . Osteoporosis  . Stage 3 chronic kidney disease [facility])  Past Surgical History: Past Surgical History: Procedure Laterality Date . ABDOMINAL HYSTERECTOMY   . BREATH TEK H PYLORI N/A 06/06/2013  Procedure: BREATH TEK H PYLORI;  Surgeon: Gatha Mayer, MD;  Location: [facility];  Service: Endoscopy;  Laterality: N/A; . ESOPHAGOGASTRODUODENOSCOPY N/A 02/14/2013  Procedure: ESOPHAGOGASTRODUODENOSCOPY (EGD);  Surgeon: Gatha Mayer, MD;   Location:  [facility];  Service: Endoscopy;  Laterality: N/A; . FEMUR IM NAIL  04/13/2012  Procedure: INTRAMEDULLARY (IM) NAIL FEMORAL;  Surgeon: Melina Schools, MD;  Location: [facility] ORS;  Service: Orthopedics;  Laterality: Left; . HIP SURGERY   . KNEE ARTHROSCOPY Bilateral   BIlateral knees at [facility] . MASTECTOMY Left  . SHOULDER HEMI-ARTHROPLASTY Right 07/2010  Dr. Mardelle Matte HPI: LATRISH MOGEL is a 84 y.o. female with medical history significant of dementia, HTN.  Patient presents to the ED with increased confusion, slurred speech, inability to ambulate, L>R sided weakness.  Some intermittent symptoms noticed a week ago. MRI reveals multiple acute embolic sub-cm strokes.  No data recorded Assessment / Plan / Recommendation CHL IP CLINICAL IMPRESSIONS 05/02/2018 Clinical Impression Pt demonstrates a mild oropharyngeal dysphagia with no overt weakness. Prominent cricopharyngeus observed with trace backflow to pyriforms in addition to mild entrapment of solids and liquids in the valleculae and pyriforms. This results in trace silent frank penetration which clears with spontanous and cued throat clearing. Suspect this is a baseline age related/primary cervical esophageal impairment. Recommend pt resumed a regular diet and thin liquids with additional aspiration precautions including cues to clear throat intermittently and to participate in consistent oral care. [name]  SLP Visit Diagnosis Frontal lobe and executive function deficit Attention and concentration deficit following -- Frontal lobe and executive function deficit following Cerebral infarction Impact on safety and function Mild aspiration risk   CHL IP TREATMENT RECOMMENDATION 05/02/2018 Treatment Recommendations Therapy as outlined in treatment plan below   Prognosis 05/02/2018 Prognosis for Safe Diet Advancement Good Barriers to Reach Goals -- Barriers/Prognosis Comment -- CHL IP DIET RECOMMENDATION 05/02/2018 SLP Diet Recommendations Regular solids;Thin liquid Liquid  Administration via Cup;Straw Medication Administration Whole meds with puree Compensations Slow rate;Small sips/bites;Clear throat intermittently Postural Changes Seated upright at 90 degrees   CHL IP OTHER RECOMMENDATIONS 05/02/2018 Recommended Consults -- Oral Care Recommendations Oral care BID Other Recommendations --   CHL IP FOLLOW UP RECOMMENDATIONS 05/02/2018 Follow up Recommendations Skilled Nursing facility   [facility] IP FREQUENCY AND DURATION 05/02/2018 Speech Therapy Frequency (ACUTE ONLY) min 2x/week Treatment Duration 1 week      CHL IP ORAL PHASE 05/02/2018 Oral Phase WFL Oral - Pudding Teaspoon -- Oral - Pudding Cup -- Oral - Honey Teaspoon -- Oral - Honey Cup -- Oral - Nectar Teaspoon -- Oral - Nectar Cup -- Oral - Nectar Straw -- Oral - Thin Teaspoon -- Oral - Thin Cup -- Oral - Thin Straw -- Oral - Puree -- Oral - Mech Soft -- Oral - Regular -- Oral - Multi-Consistency -- Oral - Pill -- Oral Phase - Comment --  CHL IP PHARYNGEAL PHASE 05/02/2018 Pharyngeal Phase Impaired Pharyngeal- Pudding Teaspoon -- Pharyngeal -- Pharyngeal- Pudding Cup -- Pharyngeal -- Pharyngeal- Honey Teaspoon -- Pharyngeal -- Pharyngeal- Honey Cup -- Pharyngeal -- Pharyngeal- Nectar Teaspoon -- Pharyngeal -- Pharyngeal- Nectar Cup -- Pharyngeal -- Pharyngeal- Nectar Straw -- Pharyngeal -- Pharyngeal- Thin Teaspoon -- Pharyngeal -- Pharyngeal- Thin Cup Pharyngeal residue - valleculae;Pharyngeal residue - pyriform;Penetration/Apiration after swallow Pharyngeal Material enters airway, remains ABOVE vocal cords and not ejected out;Material enters airway, CONTACTS cords and then ejected out;Material does not enter airway Pharyngeal- Thin Straw Penetration/Apiration after swallow;Penetration/Aspiration during swallow;Pharyngeal residue - valleculae;Pharyngeal residue - pyriform;Pharyngeal residue - cp segment Pharyngeal Material enters airway, CONTACTS cords and not ejected out;Material enters airway, CONTACTS cords and then ejected  out;Material does not enter airway Pharyngeal- Puree Pharyngeal residue - valleculae Pharyngeal -- Pharyngeal- Mechanical Soft -- Pharyngeal --  Pharyngeal- Regular Pharyngeal residue - valleculae Pharyngeal -- Pharyngeal- Multi-consistency -- Pharyngeal -- Pharyngeal- Pill -- Pharyngeal -- Pharyngeal Comment --  No flowsheet data found. Herbie [place], [place] CCC-SLP [phone] Lynann Beaver 05/02/2018, 3:48 PM                 Subjective: Patient with left side weakness. Per grand daughter at bed side left arm weakness appears improved, she is now able to move more left arm. Left lower extremity weakness persist, able to wiggle her toes.  Patient is alert, conversant. Follows command.   Discharge Exam: Vitals:   05/07/18 0600 05/07/18 0700  BP:    Pulse: 67 62  Resp: 18 14  Temp:    SpO2: 98% 100%   Vitals:   05/07/18 0400 05/07/18 0500 05/07/18 0600 05/07/18 0700  BP: (!) 144/86     Pulse: 64 66 67 62  Resp: (!) 21 15 18 14   Temp:      TempSrc:      SpO2: 98% 100% 98% 100%  Weight:      Height:        General: Pt is alert, awake, not in acute distress Cardiovascular: RRR, S1/S2 +, no rubs, no gallops Respiratory: CTA bilaterally, no wheezing, no rhonchi Abdominal: Soft, NT, ND, bowel sounds + Extremities: no edema, no cyanosis Neuro; alert, left side weakness, 3/5/ upper extremity. Left LE wiggle toes. 2/5.     The results of significant diagnostics from this hospitalization (including imaging, microbiology, ancillary and laboratory) are listed below for reference.     Microbiology: Recent Results (from the past 240 hour(s))  MRSA PCR Screening     Status: None   Collection Time: 05/02/18  6:43 AM  Result Value Ref Range Status   MRSA by PCR NEGATIVE NEGATIVE Final    Comment:        The GeneXpert MRSA Assay (FDA approved for NASAL specimens only), is one component of a comprehensive MRSA colonization surveillance program. It is not intended to diagnose  MRSA infection nor to guide or monitor treatment for MRSA infections. Performed at [facility], [place] [address], [place], [place] [address]      Labs: BNP (last 3 results) No results for input(s): BNP in the last 8760 hours. Basic Metabolic Panel: Recent Labs  Lab 05/01/18 2206 05/04/18 0246 05/05/18 0357 05/06/18 0356  NA 141 140 139 140  K 4.5 4.1 3.8 3.9  CL 109 111 108 109  CO2 24 24 21* 20*  GLUCOSE 101* 89 118* 100*  BUN 16 15 12 15   CREATININE 0.98 0.95 0.93 0.95  CALCIUM 9.3 8.7* 8.7* 8.5*  MG  --   --  2.0  --    Liver Function Tests: Recent Labs  Lab 05/01/18 2206  AST 21  ALT 12  ALKPHOS 38  BILITOT 0.7  PROT 6.2*  ALBUMIN 3.8   No results for input(s): LIPASE, AMYLASE in the last 168 hours. No results for input(s): AMMONIA in the last 168 hours. CBC: Recent Labs  Lab 05/01/18 2206 05/04/18 0246 05/05/18 0357 05/06/18 0356  WBC 9.3 8.6 11.3* 9.3  NEUTROABS 5.2  --   --   --   HGB 12.9 12.8 13.5 12.3  HCT 41.6 40.7 42.5 39.4  MCV 88.1 86.4 85.7 86.6  PLT 230 221 226 210   Cardiac Enzymes: No results for input(s): CKTOTAL, CKMB, CKMBINDEX, TROPONINI in the last 168 hours. BNP: Invalid input(s): POCBNP CBG: No results for input(s): GLUCAP  in the last 168 hours. D-Dimer No results for input(s): DDIMER in the last 72 hours. Hgb A1c No results for input(s): HGBA1C in the last 72 hours. Lipid Profile No results for input(s): CHOL, HDL, LDLCALC, TRIG, CHOLHDL, LDLDIRECT in the last 72 hours. Thyroid function studies No results for input(s): TSH, T4TOTAL, T3FREE, THYROIDAB in the last 72 hours.  Invalid input(s): FREET3 Anemia work up No results for input(s): VITAMINB12, FOLATE, FERRITIN, TIBC, IRON, RETICCTPCT in the last 72 hours. Urinalysis    Component Value Date/Time   COLORURINE YELLOW 05/01/2018 2300   APPEARANCEUR CLEAR 05/01/2018 2300   LABSPEC 1.012 05/01/2018 2300   PHURINE 6.0 05/01/2018 2300   GLUCOSEU NEGATIVE  05/01/2018 2300   HGBUR NEGATIVE 05/01/2018 2300   BILIRUBINUR NEGATIVE 05/01/2018 2300   BILIRUBINUR Negative 11/29/2017 1605   KETONESUR NEGATIVE 05/01/2018 2300   PROTEINUR NEGATIVE 05/01/2018 2300   UROBILINOGEN 0.2 11/29/2017 1605   UROBILINOGEN 0.2 10/08/2009 1326   NITRITE NEGATIVE 05/01/2018 2300   LEUKOCYTESUR TRACE (A) 05/01/2018 2300   Sepsis Labs Invalid input(s): PROCALCITONIN,  WBC,  LACTICIDVEN Microbiology Recent Results (from the past 240 hour(s))  MRSA PCR Screening     Status: None   Collection Time: 05/02/18  6:43 AM  Result Value Ref Range Status   MRSA by PCR NEGATIVE NEGATIVE Final    Comment:        The GeneXpert MRSA Assay (FDA approved for NASAL specimens only), is one component of a comprehensive MRSA colonization surveillance program. It is not intended to diagnose MRSA infection nor to guide or monitor treatment for MRSA infections. Performed at [facility], [place] [address], [place], [place] [address]      Time coordinating discharge: 35 minutes.   SIGNED:   Elmarie Shiley, MD  [name] 05/07/2018, 11:42 AM Pager [phone]  If 7PM-7AM, please contact night-coverage [email or web address] Password [email or web address]

## 2018-05-07 NOTE — Plan of Care (Signed)
Pt alert to self only, but follows direction well and has daughter at bedside. SR/SB on monitor, pt c/o pain/muscle spasms in left leg, IV robaxin order obtained & administered with pt noted to be sleeping after 1 hour of administration. Suppository administered at beginning of shift as pt has not had a documented BM in a few days, pt tolerated well. Pt repositioned as tolerated, VSS.

## 2018-05-07 NOTE — Progress Notes (Signed)
CSW handed pt's D/C packet to the admin/secretary on 4NP and called [facility].  RN updated.  CSW will continue to follow for D/C needs.  Alphonse Guild. Frederico Gerling, LCSW, LCAS, CSI Clinical Social Worker Ph: [phone]

## 2018-05-07 NOTE — Clinical Social Work Placement (Signed)
   CLINICAL SOCIAL WORK PLACEMENT  NOTE  Date:  05/07/2018  Patient Details  Name: Katie Silva MRN: [id number] Date of Birth: Oct 16, 1925  Clinical Social Work is seeking post-discharge placement for this patient at the [place] level of care (*CSW will initial, date and re-position this form in  chart as items are completed):  Yes   Patient/family provided with [place] Work Department's list of facilities offering this level of care within the geographic area requested by the patient (or if unable, by the patient's family).  Yes   Patient/family informed of their freedom to choose among providers that offer the needed level of care, that participate in Medicare, Medicaid or managed care program needed by the patient, have an available bed and are willing to accept the patient.      Patient/family informed of [place]'s ownership interest in [facility] and [facility], as well as of the fact that they are under no obligation to receive care at these facilities.  PASRR submitted to EDS on       PASRR number received on 05/06/18     Existing PASRR number confirmed on 05/06/18     FL2 transmitted to all facilities in geographic area requested by pt/family on       FL2 transmitted to all facilities within larger geographic area on       Patient informed that his/her managed care company has contracts with or will negotiate with certain facilities, including the following:        Yes   Patient/family informed of bed offers received.  Patient chooses bed at ([facility])     Physician recommends and patient chooses bed at      Patient to be transferred to   on 05/07/18.  Patient to be transferred to facility by Corey Harold)     Patient family notified on 05/07/18 of transfer.  Name of family member notified:  [name] [phone]     PHYSICIAN       Additional Comment:     _______________________________________________ Claudine Mouton, [place] 05/07/2018, 4:53 PM

## 2018-05-08 DIAGNOSIS — F0390 Unspecified dementia without behavioral disturbance: Secondary | ICD-10-CM | POA: Diagnosis not present

## 2018-05-08 DIAGNOSIS — I639 Cerebral infarction, unspecified: Secondary | ICD-10-CM | POA: Diagnosis not present

## 2018-05-08 DIAGNOSIS — N183 Chronic kidney disease, stage 3 (moderate): Secondary | ICD-10-CM | POA: Diagnosis not present

## 2018-05-08 DIAGNOSIS — I10 Essential (primary) hypertension: Secondary | ICD-10-CM | POA: Diagnosis not present

## 2018-05-08 DIAGNOSIS — I4891 Unspecified atrial fibrillation: Secondary | ICD-10-CM | POA: Diagnosis not present

## 2018-05-08 DIAGNOSIS — I509 Heart failure, unspecified: Secondary | ICD-10-CM | POA: Diagnosis not present

## 2018-05-10 ENCOUNTER — Telehealth: Payer: Self-pay | Admitting: Behavioral Health

## 2018-05-10 NOTE — Telephone Encounter (Signed)
Confirmed with representative of [facility] that the patient was admitted to the facility post hospital discharge on 05/07/18.

## 2018-05-11 ENCOUNTER — Ambulatory Visit: Payer: Medicare PPO | Admitting: Family Medicine

## 2018-06-08 DIAGNOSIS — I129 Hypertensive chronic kidney disease with stage 1 through stage 4 chronic kidney disease, or unspecified chronic kidney disease: Secondary | ICD-10-CM | POA: Diagnosis not present

## 2018-06-08 DIAGNOSIS — R1312 Dysphagia, oropharyngeal phase: Secondary | ICD-10-CM | POA: Diagnosis not present

## 2018-06-08 DIAGNOSIS — I69322 Dysarthria following cerebral infarction: Secondary | ICD-10-CM | POA: Diagnosis not present

## 2018-06-08 DIAGNOSIS — M810 Age-related osteoporosis without current pathological fracture: Secondary | ICD-10-CM | POA: Diagnosis not present

## 2018-06-08 DIAGNOSIS — F0390 Unspecified dementia without behavioral disturbance: Secondary | ICD-10-CM | POA: Diagnosis not present

## 2018-06-08 DIAGNOSIS — I69354 Hemiplegia and hemiparesis following cerebral infarction affecting left non-dominant side: Secondary | ICD-10-CM | POA: Diagnosis not present

## 2018-06-08 DIAGNOSIS — I69321 Dysphasia following cerebral infarction: Secondary | ICD-10-CM | POA: Diagnosis not present

## 2018-06-08 DIAGNOSIS — N183 Chronic kidney disease, stage 3 (moderate): Secondary | ICD-10-CM | POA: Diagnosis not present

## 2018-06-08 DIAGNOSIS — I480 Paroxysmal atrial fibrillation: Secondary | ICD-10-CM | POA: Diagnosis not present

## 2018-06-09 DIAGNOSIS — I10 Essential (primary) hypertension: Secondary | ICD-10-CM | POA: Diagnosis not present

## 2018-06-09 DIAGNOSIS — M810 Age-related osteoporosis without current pathological fracture: Secondary | ICD-10-CM | POA: Diagnosis not present

## 2018-06-09 DIAGNOSIS — R1312 Dysphagia, oropharyngeal phase: Secondary | ICD-10-CM | POA: Diagnosis not present

## 2018-06-09 DIAGNOSIS — I4891 Unspecified atrial fibrillation: Secondary | ICD-10-CM | POA: Diagnosis not present

## 2018-06-09 DIAGNOSIS — I2510 Atherosclerotic heart disease of native coronary artery without angina pectoris: Secondary | ICD-10-CM | POA: Diagnosis not present

## 2018-06-09 DIAGNOSIS — N183 Chronic kidney disease, stage 3 (moderate): Secondary | ICD-10-CM | POA: Diagnosis not present

## 2018-06-09 DIAGNOSIS — G309 Alzheimer's disease, unspecified: Secondary | ICD-10-CM | POA: Diagnosis not present

## 2018-06-09 DIAGNOSIS — I639 Cerebral infarction, unspecified: Secondary | ICD-10-CM | POA: Diagnosis not present

## 2018-06-09 DIAGNOSIS — F0281 Dementia in other diseases classified elsewhere with behavioral disturbance: Secondary | ICD-10-CM | POA: Diagnosis not present

## 2018-06-10 DIAGNOSIS — I69321 Dysphasia following cerebral infarction: Secondary | ICD-10-CM | POA: Diagnosis not present

## 2018-06-10 DIAGNOSIS — M810 Age-related osteoporosis without current pathological fracture: Secondary | ICD-10-CM | POA: Diagnosis not present

## 2018-06-10 DIAGNOSIS — I480 Paroxysmal atrial fibrillation: Secondary | ICD-10-CM | POA: Diagnosis not present

## 2018-06-10 DIAGNOSIS — N183 Chronic kidney disease, stage 3 (moderate): Secondary | ICD-10-CM | POA: Diagnosis not present

## 2018-06-10 DIAGNOSIS — I69354 Hemiplegia and hemiparesis following cerebral infarction affecting left non-dominant side: Secondary | ICD-10-CM | POA: Diagnosis not present

## 2018-06-10 DIAGNOSIS — I129 Hypertensive chronic kidney disease with stage 1 through stage 4 chronic kidney disease, or unspecified chronic kidney disease: Secondary | ICD-10-CM | POA: Diagnosis not present

## 2018-06-10 DIAGNOSIS — I69322 Dysarthria following cerebral infarction: Secondary | ICD-10-CM | POA: Diagnosis not present

## 2018-06-10 DIAGNOSIS — R1312 Dysphagia, oropharyngeal phase: Secondary | ICD-10-CM | POA: Diagnosis not present

## 2018-06-10 DIAGNOSIS — F0390 Unspecified dementia without behavioral disturbance: Secondary | ICD-10-CM | POA: Diagnosis not present

## 2018-06-12 ENCOUNTER — Emergency Department (HOSPITAL_COMMUNITY): Payer: Medicare PPO

## 2018-06-12 ENCOUNTER — Encounter (HOSPITAL_COMMUNITY): Payer: Self-pay | Admitting: Emergency Medicine

## 2018-06-12 ENCOUNTER — Emergency Department (HOSPITAL_COMMUNITY)
Admission: EM | Admit: 2018-06-12 | Discharge: 2018-06-12 | Disposition: A | Discharge status: Home / Self Care | Payer: Medicare PPO | Attending: Emergency Medicine | Admitting: Emergency Medicine

## 2018-06-12 DIAGNOSIS — W19XXXA Unspecified fall, initial encounter: Secondary | ICD-10-CM | POA: Insufficient documentation

## 2018-06-12 DIAGNOSIS — F0390 Unspecified dementia without behavioral disturbance: Secondary | ICD-10-CM | POA: Insufficient documentation

## 2018-06-12 DIAGNOSIS — Y939 Activity, unspecified: Secondary | ICD-10-CM | POA: Diagnosis not present

## 2018-06-12 DIAGNOSIS — Z7901 Long term (current) use of anticoagulants: Secondary | ICD-10-CM | POA: Insufficient documentation

## 2018-06-12 DIAGNOSIS — S0101XA Laceration without foreign body of scalp, initial encounter: Secondary | ICD-10-CM

## 2018-06-12 DIAGNOSIS — S51002A Unspecified open wound of left elbow, initial encounter: Secondary | ICD-10-CM | POA: Insufficient documentation

## 2018-06-12 DIAGNOSIS — Y999 Unspecified external cause status: Secondary | ICD-10-CM | POA: Diagnosis not present

## 2018-06-12 DIAGNOSIS — I5030 Unspecified diastolic (congestive) heart failure: Secondary | ICD-10-CM | POA: Diagnosis not present

## 2018-06-12 DIAGNOSIS — M542 Cervicalgia: Secondary | ICD-10-CM | POA: Diagnosis not present

## 2018-06-12 DIAGNOSIS — Z79899 Other long term (current) drug therapy: Secondary | ICD-10-CM | POA: Insufficient documentation

## 2018-06-12 DIAGNOSIS — S81002A Unspecified open wound, left knee, initial encounter: Secondary | ICD-10-CM | POA: Diagnosis not present

## 2018-06-12 DIAGNOSIS — I130 Hypertensive heart and chronic kidney disease with heart failure and stage 1 through stage 4 chronic kidney disease, or unspecified chronic kidney disease: Secondary | ICD-10-CM | POA: Insufficient documentation

## 2018-06-12 DIAGNOSIS — Z853 Personal history of malignant neoplasm of breast: Secondary | ICD-10-CM | POA: Insufficient documentation

## 2018-06-12 DIAGNOSIS — S0181XA Laceration without foreign body of other part of head, initial encounter: Secondary | ICD-10-CM | POA: Diagnosis not present

## 2018-06-12 DIAGNOSIS — Y92129 Unspecified place in nursing home as the place of occurrence of the external cause: Secondary | ICD-10-CM | POA: Diagnosis not present

## 2018-06-12 DIAGNOSIS — I10 Essential (primary) hypertension: Secondary | ICD-10-CM | POA: Diagnosis not present

## 2018-06-12 DIAGNOSIS — Z96611 Presence of right artificial shoulder joint: Secondary | ICD-10-CM | POA: Diagnosis not present

## 2018-06-12 DIAGNOSIS — N183 Chronic kidney disease, stage 3 (moderate): Secondary | ICD-10-CM | POA: Diagnosis not present

## 2018-06-12 DIAGNOSIS — S51012A Laceration without foreign body of left elbow, initial encounter: Secondary | ICD-10-CM | POA: Diagnosis not present

## 2018-06-12 DIAGNOSIS — R58 Hemorrhage, not elsewhere classified: Secondary | ICD-10-CM | POA: Diagnosis not present

## 2018-06-12 DIAGNOSIS — S0990XA Unspecified injury of head, initial encounter: Secondary | ICD-10-CM | POA: Diagnosis not present

## 2018-06-12 DIAGNOSIS — R001 Bradycardia, unspecified: Secondary | ICD-10-CM | POA: Diagnosis not present

## 2018-06-12 DIAGNOSIS — S80812A Abrasion, left lower leg, initial encounter: Secondary | ICD-10-CM

## 2018-06-12 HISTORY — DX: Unspecified dementia, unspecified severity, without behavioral disturbance, psychotic disturbance, mood disturbance, and anxiety: F03.90

## 2018-06-12 LAB — CBC WITH DIFFERENTIAL/PLATELET
Abs Immature Granulocytes: 0.1 10*3/uL (ref 0.0–0.1)
BASOS ABS: 0 10*3/uL (ref 0.0–0.1)
Basophils Relative: 0 %
EOS ABS: 0.2 10*3/uL (ref 0.0–0.7)
EOS PCT: 2 %
HEMATOCRIT: 39.7 % (ref 36.0–46.0)
Hemoglobin: 12.2 g/dL (ref 12.0–15.0)
Immature Granulocytes: 1 %
Lymphocytes Relative: 25 %
Lymphs Abs: 2.4 10*3/uL (ref 0.7–4.0)
MCH: 27.3 pg (ref 26.0–34.0)
MCHC: 30.7 g/dL (ref 30.0–36.0)
MCV: 88.8 fL (ref 78.0–100.0)
MONO ABS: 1.2 10*3/uL — AB (ref 0.1–1.0)
Monocytes Relative: 13 %
Neutro Abs: 5.6 10*3/uL (ref 1.7–7.7)
Neutrophils Relative %: 59 %
Platelets: 191 10*3/uL (ref 150–400)
RBC: 4.47 MIL/uL (ref 3.87–5.11)
RDW: 15.8 % — AB (ref 11.5–15.5)
WBC: 9.6 10*3/uL (ref 4.0–10.5)

## 2018-06-12 LAB — BASIC METABOLIC PANEL
Anion gap: 7 (ref 5–15)
BUN: 24 mg/dL — ABNORMAL HIGH (ref 8–23)
CO2: 25 mmol/L (ref 22–32)
CREATININE: 1.03 mg/dL — AB (ref 0.44–1.00)
Calcium: 9 mg/dL (ref 8.9–10.3)
Chloride: 108 mmol/L (ref 98–111)
GFR calc Af Amer: 54 mL/min — ABNORMAL LOW (ref >60)
GFR, EST NON AFRICAN AMERICAN: 46 mL/min — AB (ref >60)
GLUCOSE: 88 mg/dL (ref 70–99)
POTASSIUM: 4.4 mmol/L (ref 3.5–5.1)
SODIUM: 140 mmol/L (ref 135–145)

## 2018-06-12 LAB — CK: CK TOTAL: 40 U/L (ref 38–234)

## 2018-06-12 NOTE — ED Triage Notes (Addendum)
Patient arrived with EMS from [facility] staff found pt. sitting on the floor inside her bedroom this morning with approx.1" scalp laceration at left temporal area with mild bleeding and skin tear at left elbow and left knee , pt. disoriented to time,place and situation ( demented) . She is taking Eliquis ( Hx. Afib) . Patient can not recall incident .

## 2018-06-12 NOTE — Discharge Instructions (Addendum)
Tissue adhesive wound care: Do not shower for the next 24 hours. Do not soak the area where the tissue adhesive was placed. Do not take baths, swim, or use hot tubs. Do not use any soaps, petroleum jelly products, or ointments on the wound. Certain ointments can weaken the glue. Do not scrub the area. If a bandage (dressing) has been applied, keep it dry. Follow instructions from your health care provider about how often to change the dressing. Wash your hands with soap and water before you change your dressing. If soap and water are not available, use hand sanitizer. Change your dressing as told by your health care provider. Leave tissue adhesive in place. It will fall off on its own after 7-10 days. Do not scratch, rub, or pick at the adhesive. Do not place tape over the adhesive. The adhesive could come off of the wound when you pull the tape off. Protect the wound from further injury until it is healed.

## 2018-06-12 NOTE — ED Provider Notes (Signed)
[facility] EMERGENCY DEPARTMENT Provider Note   CSN: [id number] Arrival date & time: 06/12/18  0867     History   Chief Complaint Chief Complaint  Patient presents with  . Fall    Taking Eliquis  . Head Laceration    HPI Katie Silva is a 84 y.o. female .   84 year old female presents via EMS for fall and head laceration.  She is a past medical history of dementia.  There is a level 5 caveat due to dementia.  Patient is unable to provide the history.  History is gathered from nursing notes and EMS, as well as review of EMR.  Patient was found by nursing staff in her memory care unit today sitting on the floor.  She was down for an unknown amount of time.  She had a laceration to the left scalp skin tear to the left elbow and left knee.  She was ambulatory.  Normal vital signs by EMS.  No overt neurologic deficits.  Patient is at baseline according to nursing facility staff.  She is on Eliquis for history of atrial fibrillation.  She arrives with C-spine precautions.   Fall  This is a recurrent problem. The current episode started 12 to 24 hours ago. Associated symptoms comments: Skin tears and head lac.    Past Medical History:  Diagnosis Date  . Anemia   . Arthritis    hands, knees  . Breast cancer, left breast ([place]) 2003   s/p mastectomy, chemo.  Dr. Chancy Milroy oncologist  . Dementia [facility])   . Fracture of humerus, proximal, right, closed 07/2010   s/p hemiarthroplasty  . Gastric ulcer with hemorrhage 02/14/2013  . Helicobacter pylori (H. pylori) infection 02/15/2013   eradication confirmed 05/2013 [facility]  . Hypertension   . Multiple duodenal ulcers 02/14/2013  . Osteoporosis   . Stage 3 chronic kidney disease [facility])     Patient Active Problem List   Diagnosis Date Noted  . New onset atrial fibrillation ([place]) 05/02/2018  . Acute cardioembolic stroke ([place]) 61/95/0932  . Atrial fibrillation with RVR ([place])   . Cerebrovascular accident (CVA) due to embolism  of precerebral artery ([place])   . Diastolic dysfunction   . Benign essential HTN   . Stage 3 chronic kidney disease ([place])   . Urinary frequency 11/29/2017  . Encounter for completion of form with patient 05/31/2017  . Pedal edema 12/31/2015  . Dementia ([employer]) 03/14/2015  . Postoperative anemia 04/14/2012  . SVT (supraventricular tachycardia) ([place]) 04/14/2012  . Closed left hip fracture ([place]) 04/13/2012  . HTN (hypertension) 01/30/2011  . Breast cancer, left breast ([place])   . Osteoporosis   . Fracture of humerus, proximal, right, closed 07/08/2010    Past Surgical History:  Procedure Laterality Date  . ABDOMINAL HYSTERECTOMY    . BREATH TEK H PYLORI N/A 06/06/2013   Procedure: BREATH TEK H PYLORI;  Surgeon: Gatha Mayer, MD;  Location: [facility] ENDOSCOPY;  Service: Endoscopy;  Laterality: N/A;  . ESOPHAGOGASTRODUODENOSCOPY N/A 02/14/2013   Procedure: ESOPHAGOGASTRODUODENOSCOPY (EGD);  Surgeon: Gatha Mayer, MD;  Location: [facility];  Service: Endoscopy;  Laterality: N/A;  . FEMUR IM NAIL  04/13/2012   Procedure: INTRAMEDULLARY (IM) NAIL FEMORAL;  Surgeon: Melina Schools, MD;  Location: [facility] ORS;  Service: Orthopedics;  Laterality: Left;  . HIP SURGERY    . KNEE ARTHROSCOPY Bilateral    [facility]  . MASTECTOMY Left   . SHOULDER HEMI-ARTHROPLASTY Right 07/2010   Dr. Mardelle Matte  OB History   None      Home Medications    Prior to Admission medications   Medication Sig Start Date End Date Taking? Authorizing Provider  acetaminophen (TYLENOL) 325 MG tablet Take 650 mg by mouth every 4 (four) hours as needed for mild pain.   Yes [provider]  apixaban (ELIQUIS) 2.5 MG TABS tablet Take 2.5 mg by mouth 2 (two) times daily.   Yes [provider]  divalproex (DEPAKOTE) 125 MG DR tablet Take 125 mg by mouth 2 (two) times daily.   Yes [provider]  divalproex (DEPAKOTE) 125 MG DR tablet Take 125 mg by mouth every 12 (twelve) hours as needed (for  agitation).   Yes [provider]  lisinopril (PRINIVIL,ZESTRIL) 10 MG tablet Take 1 tablet (10 mg total) by mouth daily. 11/16/17  Yes Lucille Passy, MD  metoprolol tartrate (LOPRESSOR) 25 MG tablet Take 0.5 tablets (12.5 mg total) by mouth 2 (two) times daily. 05/07/18  Yes Regalado, Belkys A, MD  Misc Natural Products (OSTEO BI-FLEX JOINT SHIELD PO) Take 1 tablet by mouth daily.   Yes [provider]  Multiple Vitamin (MULTIVITAMIN WITH MINERALS) TABS tablet Take 1 tablet by mouth daily.   Yes [provider]  pravastatin (PRAVACHOL) 20 MG tablet Take 1 tablet (20 mg total) by mouth daily at 6 PM. 05/07/18  Yes Regalado, Belkys A, MD  apixaban (ELIQUIS) 5 MG TABS tablet Take 1 tablet (5 mg total) by mouth 2 (two) times daily. Patient not taking: Reported on 06/12/2018 05/07/18   Niel Hummer A, MD  traZODone (DESYREL) 50 MG tablet Take 0.5 tablets (25 mg total) by mouth at bedtime as needed for sleep (or agitation). Patient not taking: Reported on 06/12/2018 05/07/18   Elmarie Shiley, MD    Family History Family History  Problem Relation Age of Onset  . Myasthenia gravis Mother   . Coronary artery disease Father   . Colon cancer Sister     Social History Social History   Tobacco Use  . Smoking status: Never Smoker  . Smokeless tobacco: Never Used  Substance Use Topics  . Alcohol use: No  . Drug use: No     Allergies   Fexofenadine hcl; Aricept [donepezil hcl]; Ativan [lorazepam]; Penicillins; and Tylenol [acetaminophen]   Review of Systems Review of Systems  Unable to perform ROS: Dementia     Physical Exam Updated Vital Signs BP (!) 155/62   Pulse (!) 52   Temp 97.8 F (36.6 C)   Resp 20   SpO2 99%   Physical Exam  Constitutional: She is oriented to person, place, and time. She appears well-developed and well-nourished. No distress.  HENT:  Head: Normocephalic.  1 cm superficial laceration to the left temporal area bleeding is  controlled  Eyes: Pupils are equal, round, and reactive to light. Conjunctivae and EOM are normal. No scleral icterus.  Neck:  C-spine precaution with rolled towel  Cardiovascular: Normal rate, regular rhythm and normal heart sounds. Exam reveals no gallop and no friction rub.  No murmur heard. Pulmonary/Chest: Effort normal and breath sounds normal. No respiratory distress.  Abdominal: Soft. Bowel sounds are normal. She exhibits no distension and no mass. There is no tenderness. There is no guarding.  Musculoskeletal:  Skin tear over the left elbow.  Full range of motion without bony tenderness or deformity Skin tear over the left knee full range of motion without bony tenderness deformity or swelling.  Neurological: She is  alert and oriented to person, place, and time.  Skin: Skin is warm and dry. She is not diaphoretic.  Psychiatric: Her behavior is normal.  Nursing note and vitals reviewed.    ED Treatments / Results  Labs (all labs ordered are listed, but only abnormal results are displayed) Labs Reviewed - No data to display  EKG None  Radiology No results found.  Procedures Procedures (including critical care time)  LACERATION REPAIR Performed by: Margarita Mail Authorized by: Margarita Mail Consent: Verbal consent obtained. Risks and benefits: risks, benefits and alternatives were discussed Consent given by: patient Patient identity confirmed: provided demographic data Prepped and Draped in normal sterile fashion Wound explored  Laceration Location: scalp  Laceration Length: 2cm  No Foreign Bodies seen or palpated   Irrigation method: syringe Amount of cleaning: standard  Skin closure: Dermabond  Technique: Hair apposition  Patient tolerance: Patient tolerated the procedure well with no immediate complications.  Medications Ordered in ED Medications - No data to display   Initial Impression / Assessment and Plan / ED Course  I have reviewed the  triage vital signs and the nursing notes.  Pertinent labs & imaging results that were available during my care of the patient were reviewed by me and considered in my medical decision making (see chart for details).    84 year old female who had unwitnessed fall at her memory care facility.  Her daughter is at bedside.  CT imaging is negative.  Physical examination shows no bony abnormalities.  She is noticed no midline spinal tenderness or sacral tenderness.  She is able to move all of her joints easily.  Scalp repaired with hair apposition technique.  Her skin tears were cleansed and bandaged.  Patient will be discharged back to her memory care facility appears appropriate for discharge at this time  Final Clinical Impressions(s) / ED Diagnoses   Final diagnoses:  Fall, initial encounter  Laceration of scalp, initial encounter  Skin tear of left elbow without complication, initial encounter  Abrasion of left lower extremity, initial encounter    ED Discharge Orders    None       Margarita Mail, PA-C 06/12/18 1614    Long, [facility], MD 06/12/18 1720

## 2018-06-12 NOTE — ED Notes (Signed)
[facility] contacted per [facility] facility for transport

## 2018-06-13 ENCOUNTER — Encounter: Payer: Self-pay | Admitting: Adult Health

## 2018-06-13 ENCOUNTER — Ambulatory Visit: Payer: Medicare PPO | Admitting: Adult Health

## 2018-06-13 VITALS — BP 140/78 | HR 52 | Ht 60.0 in | Wt 132.0 lb

## 2018-06-13 DIAGNOSIS — I63411 Cerebral infarction due to embolism of right middle cerebral artery: Secondary | ICD-10-CM | POA: Diagnosis not present

## 2018-06-13 DIAGNOSIS — I10 Essential (primary) hypertension: Secondary | ICD-10-CM

## 2018-06-13 DIAGNOSIS — I4891 Unspecified atrial fibrillation: Secondary | ICD-10-CM | POA: Diagnosis not present

## 2018-06-13 DIAGNOSIS — E785 Hyperlipidemia, unspecified: Secondary | ICD-10-CM

## 2018-06-13 DIAGNOSIS — I63433 Cerebral infarction due to embolism of bilateral posterior cerebral arteries: Secondary | ICD-10-CM | POA: Diagnosis not present

## 2018-06-13 DIAGNOSIS — I63422 Cerebral infarction due to embolism of left anterior cerebral artery: Secondary | ICD-10-CM | POA: Diagnosis not present

## 2018-06-13 NOTE — Progress Notes (Signed)
[facility] [address] [place]. [place] [address] [phone]       OFFICE FOLLOW UP NOTE  Ms. Katie Silva Date of Birth:  Oct 23, 1925 Medical Record Number:  [id number]   Reason for Referral:  hospital stroke follow up  CHIEF COMPLAINT:  Chief Complaint  Patient presents with  . New Patient (Initial Visit)    05/01/18 ED; had a fall yesterday and went to ED. She was at [facility], now she is at [facility] memory center.   [name] Referral    hospital referral  . Room 9    She is here with daughter [name] [facility]    HPI: Katie Silva is being seen today for initial visit in the office for multiple cortical infarcts on 05/01/2018. History obtained from patient, daughter  and chart review. Reviewed all radiology images and labs personally.  Ms. Katie Silva is a 84 y.o. female with history of dementia, HTN, gastric & duodenal ulcer s/ hmg, breast cancer who presented with with L HP, confusion, slurred speech and L hip pain.  CT head reviewed and was negative for acute infarct.  CTA head and neck showed 4.2 ascending aortic aneurysm and arthrosclerosis.  MRI brain reviewed and showed subcentimeter infarct in left post frontal, right caudate, right putamen, right pons and bilateral cerebellar infarcts.  2D echo showed an EF of 60 to 65% without cardiac source of embolus.  Patient was found to have atrial fibrillation and was recommended to start Eliquis as it was felt to be recent infarcts embolic secondary to AF.  LDL 85 and recommended to start Pravachol 20 mg at discharge.  HTN stable during admission recommended long-term BP goal normotensive range.  A1c satisfactory at 5.2.  Patient was discharged to [facility] due to continued deficits.  Since discharge to SNF, patient presented to ED via EMS after unwitnessed fall and head laceration on 06/12/2018 (yesterday).  Per notes, vital signs normal and no overt neurological deficits noted.  Patient did  complain of headache and neck pain therefore CT cervical spine performed along with CT head.  CT head reviewed and did not show evidence of acute intracranial abnormality.  CT cervical spine was negative for cervical spine fracture but did show advanced multilevel degenerative changes.  Had laceration repaired along with cleaning and dressing of skin tears and was discharged back to her SNF in stable condition.  Patient is here today for hospital follow-up and is accompanied by her daughter.  She has since been transferred to [facility] place due to increased amount of care needed and assistance with ADLs that was unable to be provided at [facility].  She is currently receiving PT/OT/ST and per daughter and patient, has been making progress.  She was previously residing with her daughter where she was able to ambulate with assistance of a quad cane and only needed minimal assistance with ADLs.  She currently is using wheelchair as she has been unable to ambulate due to left hemiparesis but per daughter, therapy plans on trialing use of walker.  Daughter does state that at times she is unaware of her deficits and that is what led to her recent fall.  Patient has no complaints at this time related to the fall with bandaging covering left elbow and knee and intact laceration on head.  She does continue to have left hemiparesis along with slight worsening of her dementia but daughter feels as though she has had some improvement with her  left hemiparesis.  She continues to take Eliquis with mild bruising but no bleeding.  Continues to take pravastatin without side effects myalgias.  Blood pressure today 140/78.  Denies new or worsening stroke/TIA symptoms.   ROS:   14 system review of systems performed and negative with exception of memory loss and weakness  PMH:  Past Medical History:  Diagnosis Date  . Anemia   . Arthritis    hands, knees  . Breast cancer, left breast ([place]) 2003   s/p mastectomy,  chemo.  Dr. Chancy Milroy oncologist  . Dementia [facility])   . Fracture of humerus, proximal, right, closed 07/2010   s/p hemiarthroplasty  . Gastric ulcer with hemorrhage 02/14/2013  . Helicobacter pylori (H. pylori) infection 02/15/2013   eradication confirmed 05/2013 [facility]  . Hypertension   . Multiple duodenal ulcers 02/14/2013  . Osteoporosis   . Stage 3 chronic kidney disease ([place])   . Stroke [facility]) 04/2018    PSH:  Past Surgical History:  Procedure Laterality Date  . ABDOMINAL HYSTERECTOMY    . BREATH TEK H PYLORI N/A 06/06/2013   Procedure: BREATH TEK H PYLORI;  Surgeon: Gatha Mayer, MD;  Location: [facility] ENDOSCOPY;  Service: Endoscopy;  Laterality: N/A;  . ESOPHAGOGASTRODUODENOSCOPY N/A 02/14/2013   Procedure: ESOPHAGOGASTRODUODENOSCOPY (EGD);  Surgeon: Gatha Mayer, MD;  Location: [facility];  Service: Endoscopy;  Laterality: N/A;  . FEMUR IM NAIL  04/13/2012   Procedure: INTRAMEDULLARY (IM) NAIL FEMORAL;  Surgeon: Melina Schools, MD;  Location: [facility] ORS;  Service: Orthopedics;  Laterality: Left;  . HIP SURGERY    . KNEE ARTHROSCOPY Bilateral    BIlateral knees at [facility]  . MASTECTOMY Left   . SHOULDER HEMI-ARTHROPLASTY Right 07/2010   Dr. Mardelle Matte    Social History:  Social History   Socioeconomic History  . Marital status: Widowed    Spouse name: Not on file  . Number of children: 6  . Years of education: Not on file  . Highest education level: 11th grade  Occupational History  . Occupation: retired    Fish farm manager: RETIRED  Social Needs  . Financial resource strain: Not on file  . Food insecurity:    Worry: Not on file    Inability: Not on file  . Transportation needs:    Medical: Not on file    Non-medical: Not on file  Tobacco Use  . Smoking status: Never Smoker  . Smokeless tobacco: Never Used  Substance and Sexual Activity  . Alcohol use: No  . Drug use: No  . Sexual activity: Not Currently  Lifestyle  . Physical activity:    Days per week: Not on file    Minutes per  session: Not on file  . Stress: Not on file  Relationships  . Social connections:    Talks on phone: Not on file    Gets together: Not on file    Attends religious service: Not on file    Active member of club or organization: Not on file    Attends meetings of clubs or organizations: Not on file    Relationship status: Not on file  . Intimate partner violence:    Fear of current or ex partner: Not on file    Emotionally abused: Not on file    Physically abused: Not on file    Forced sexual activity: Not on file  Other Topics Concern  . Not on file  Social History Narrative   Currently at [facility]   Left handed  Family History:  Family History  Problem Relation Age of Onset  . Myasthenia gravis Mother   . Coronary artery disease Father   . Colon cancer Sister   . GER disease Son     Medications:   Current Outpatient Medications on File Prior to Visit  Medication Sig Dispense Refill  . acetaminophen (TYLENOL) 325 MG tablet Take 650 mg by mouth every 4 (four) hours as needed for mild pain.    [name] apixaban (ELIQUIS) 2.5 MG TABS tablet Take 2.5 mg by mouth 2 (two) times daily.    [name] apixaban (ELIQUIS) 5 MG TABS tablet Take 1 tablet (5 mg total) by mouth 2 (two) times daily. (Patient not taking: Reported on 06/12/2018) 60 tablet 0  . divalproex (DEPAKOTE) 125 MG DR tablet Take 125 mg by mouth 2 (two) times daily.    . divalproex (DEPAKOTE) 125 MG DR tablet Take 125 mg by mouth every 12 (twelve) hours as needed (for agitation).    [name] lisinopril (PRINIVIL,ZESTRIL) 10 MG tablet Take 1 tablet (10 mg total) by mouth daily. 90 tablet 2  . metoprolol tartrate (LOPRESSOR) 25 MG tablet Take 0.5 tablets (12.5 mg total) by mouth 2 (two) times daily. 60 tablet 0  . Misc Natural Products (OSTEO BI-FLEX JOINT SHIELD PO) Take 1 tablet by mouth daily.    . Multiple Vitamin (MULTIVITAMIN WITH MINERALS) TABS tablet Take 1 tablet by mouth daily.    . pravastatin (PRAVACHOL) 20 MG tablet Take 1  tablet (20 mg total) by mouth daily at 6 PM. 30 tablet 0  . traZODone (DESYREL) 50 MG tablet Take 0.5 tablets (25 mg total) by mouth at bedtime as needed for sleep (or agitation). (Patient not taking: Reported on 06/12/2018) 30 tablet 0   No current facility-administered medications on file prior to visit.     Allergies:   Allergies  Allergen Reactions  . Fexofenadine Hcl Other (See Comments)    Doesn't agree with patient  . Aricept [Donepezil Hcl] Other (See Comments)    nightmares  . Ativan [Lorazepam] Other (See Comments)    On MAR  . Penicillins Other (See Comments)    Pt does not remember reaction to penicillin ("did not work")  . Tylenol [Acetaminophen] Other (See Comments)    "funny feeling in head."     Physical Exam  Vitals:   06/13/18 1030  BP: 140/78  Pulse: (!) 52  Weight: 132 lb (59.9 kg)  Height: 5' (1.524 m)   Body mass index is 25.78 kg/m. No exam data present  General: Frail pleasant elderly Caucasian female, seated, in no evident distress Head: head normocephalic and atraumatic.   Neck: supple with no carotid or supraclavicular bruits Cardiovascular: regular rate and rhythm, no murmurs Musculoskeletal: no deformity Skin:  no rash/petichiae; head laceration intact; intact dressing over left elbow and knee Vascular:  Normal pulses all extremities  Neurologic Exam Mental Status: Awake and fully alert. Disoriented to place and time. Attention span, concentration and fund of knowledge appropriate for age and dementia level. Mood and affect appropriate and cooperative with answering questions and during assessment Cranial Nerves: Fundoscopic exam reveals sharp disc margins. Pupils equal, briskly reactive to light. Extraocular movements full without nystagmus. Visual fields full to confrontation. Hearing intact. Facial sensation intact. Face, tongue, palate moves normally and symmetrically.  Motor: Normal bulk and tone.  LUE: 5/5 with mild grip weakness; LLE:  4/5 in hip flexor and ankle dorsiflexion; RUE and RLE full strength Sensory.: intact to touch , pinprick ,  position and vibratory sensation.  Coordination: Rapid alternating movements normal on right side. Finger-to-nose and heel-to-shin performed accurately on the right side.  Decreased left hand dexterity.  Slight orbiting right over left. Gait and Station: Patient is currently using wheelchair and is unable to ambulate at this time therefore gait assessment deferred Reflexes: 1+ and symmetric. Toes downgoing.    NIHSS  2 (LOC questions = answered 1 correctly) (motor leg-left = drift) Modified Rankin  4 HAS-BLED 6 CHA2DS2-VASc 2   Diagnostic Data (Labs, Imaging, Testing)  CT HEAD WO CONTRAST 05/01/2018 IMPRESSION: 1. No acute intracranial pathology. 2. Age-related atrophy and chronic microvascular ischemic changes.  MR BRAIN WO CONTRAST 05/02/2018 IMPRESSION: Subcentimeter foci of acute/early subacute infarction are present within the left posterior frontal subcortical white matter, right caudate body, right putamen, right caudate head, right hemi pons, and bilateral cerebellar hemispheres. No associated hemorrhage or mass effect. Multiple vascular territories probably represents embolic etiology.  CT ANGIO HEAD W OR WO CONTRAST CT ANGIO NECK W OR WO CONTRAST 05/02/2018 IMPRESSION: 1. Patent carotid and vertebral arteries. No dissection, aneurysm, or hemodynamically significant stenosis utilizing NASCET criteria. 2. Patent anterior and posterior intracranial circulation. No large vessel occlusion, aneurysm, or high-grade stenosis. 3. 4.2 cm ascending aortic aneurysm. Recommend annual imaging followup by CTA or MRA. This recommendation follows 2010 ACCF/AHA/AATS/ACR/ASA/SCA/SCAI/SIR/STS/SVM Guidelines for the Diagnosis and Management of Patients with Thoracic Aortic Disease. Circulation. 2010; 121: T017-B939. 4. Calcific atherosclerosis of the carotid siphons with  moderate right and mild left paraclinoid stenosis.  ECHOCARDIOGRAM 05/02/2018 Impressions: - LVEF 60-65%, moderate LVH, normal wall motion, grade 1 DD,   elevated LV filling pressure, aortic sclerosis with moderate AI,   heavy MAC with end-systolic bileaflet prolapse and trivial MR,   moderate LAE, trivial TR, RVSP 33 mmHg, dilated IVC.  CT HEAD WO CONTRAST CT CERVICAL SPINE W0 CONTRAST 06/12/2018 (post fall) IMPRESSION: CT HEAD: 1. No evidence of acute intracranial abnormality. No evidence of calvarial fracture. 2. Generalized cerebral volume loss and mild chronic small vessel ischemic changes in the cerebral white matter. CT CERVICAL SPINE: 1. No cervical spine fracture or acute malalignment. 2. Advanced multilevel degenerative changes in the cervical spine as detailed.   ASSESSMENT: Katie Silva is a 84 y.o. year old female here with multiple cortical infarcts on 05/01/2018 secondary to newly diagnosed AF. Vascular risk factors include newly dx AF, dementia, HTN and HLD.  Patient is being seen today for hospital follow-up and does continue to have mild left hemiparesis along with some worsening of prior dementia.    PLAN: -Continue Eliquis (apixaban) daily  and pravastatin 20 mg for secondary stroke prevention -F/u with PCP regarding your HLD and HTN management -Referral placed for cardiology for continued atrial fibrillation and Eliquis management -continue to monitor BP at at facility -Continue PT/OT/ST at current facility -advised to continue to stay active and maintain a healthy diet -Maintain strict control of hypertension with blood pressure goal below 130/90, diabetes with hemoglobin A1c goal below 6.5% and cholesterol with LDL cholesterol (bad cholesterol) goal below 70 mg/dL. I also advised the patient to eat a healthy diet with plenty of whole grains, cereals, fruits and vegetables, exercise regularly and maintain ideal body weight.  Follow up in 3 months or call  earlier if needed   Greater than 50% of time during this 25 minute visit was spent on counseling,explanation of diagnosis of multiple cortical infarcts, reviewing risk factor management of AF on AC, dementia, HTN and HLD, planning of further management,  discussion with patient and family and coordination of care    Venancio Poisson, [facility]  [facility] [address] [place], [place] [address]  Phone [phone] Fax [phone] Note: This document was prepared with digital dictation and possible smart phrase technology. Any transcriptional errors that result from this process are unintentional.

## 2018-06-13 NOTE — Patient Instructions (Addendum)
Continue Eliquis (apixaban) daily  and pravastatin 20mg   for secondary stroke prevention  Continue to follow up with PCP regarding cholesterol and blood pressure management   Referral placed for cardiologist due to recent atrial fibrillation - you will be called to schedule appointment   Continue to do therapies for continued weakness  Continue to monitor blood pressure at home  Maintain strict control of hypertension with blood pressure goal below 130/90, diabetes with hemoglobin A1c goal below 6.5% and cholesterol with LDL cholesterol (bad cholesterol) goal below 70 mg/dL. I also advised the patient to eat a healthy diet with plenty of whole grains, cereals, fruits and vegetables, exercise regularly and maintain ideal body weight.  Followup in the future with me in 3 months or call earlier if needed       Thank you for coming to see [place] at [facility]. I hope we have been able to provide you high quality care today.  You may receive a patient satisfaction survey over the next few weeks. We would appreciate your feedback and comments so that we may continue to improve ourselves and the health of our patients.

## 2018-06-13 NOTE — Progress Notes (Signed)
I agree with the above plan 

## 2018-06-14 DIAGNOSIS — I69321 Dysphasia following cerebral infarction: Secondary | ICD-10-CM | POA: Diagnosis not present

## 2018-06-14 DIAGNOSIS — M810 Age-related osteoporosis without current pathological fracture: Secondary | ICD-10-CM | POA: Diagnosis not present

## 2018-06-14 DIAGNOSIS — N183 Chronic kidney disease, stage 3 (moderate): Secondary | ICD-10-CM | POA: Diagnosis not present

## 2018-06-14 DIAGNOSIS — F0390 Unspecified dementia without behavioral disturbance: Secondary | ICD-10-CM | POA: Diagnosis not present

## 2018-06-14 DIAGNOSIS — I69354 Hemiplegia and hemiparesis following cerebral infarction affecting left non-dominant side: Secondary | ICD-10-CM | POA: Diagnosis not present

## 2018-06-14 DIAGNOSIS — I129 Hypertensive chronic kidney disease with stage 1 through stage 4 chronic kidney disease, or unspecified chronic kidney disease: Secondary | ICD-10-CM | POA: Diagnosis not present

## 2018-06-14 DIAGNOSIS — I480 Paroxysmal atrial fibrillation: Secondary | ICD-10-CM | POA: Diagnosis not present

## 2018-06-14 DIAGNOSIS — I69322 Dysarthria following cerebral infarction: Secondary | ICD-10-CM | POA: Diagnosis not present

## 2018-06-14 DIAGNOSIS — R1312 Dysphagia, oropharyngeal phase: Secondary | ICD-10-CM | POA: Diagnosis not present

## 2018-06-15 DIAGNOSIS — M810 Age-related osteoporosis without current pathological fracture: Secondary | ICD-10-CM | POA: Diagnosis not present

## 2018-06-15 DIAGNOSIS — I69321 Dysphasia following cerebral infarction: Secondary | ICD-10-CM | POA: Diagnosis not present

## 2018-06-15 DIAGNOSIS — I480 Paroxysmal atrial fibrillation: Secondary | ICD-10-CM | POA: Diagnosis not present

## 2018-06-15 DIAGNOSIS — F0390 Unspecified dementia without behavioral disturbance: Secondary | ICD-10-CM | POA: Diagnosis not present

## 2018-06-15 DIAGNOSIS — N183 Chronic kidney disease, stage 3 (moderate): Secondary | ICD-10-CM | POA: Diagnosis not present

## 2018-06-15 DIAGNOSIS — I69322 Dysarthria following cerebral infarction: Secondary | ICD-10-CM | POA: Diagnosis not present

## 2018-06-15 DIAGNOSIS — R1312 Dysphagia, oropharyngeal phase: Secondary | ICD-10-CM | POA: Diagnosis not present

## 2018-06-15 DIAGNOSIS — I129 Hypertensive chronic kidney disease with stage 1 through stage 4 chronic kidney disease, or unspecified chronic kidney disease: Secondary | ICD-10-CM | POA: Diagnosis not present

## 2018-06-15 DIAGNOSIS — I69354 Hemiplegia and hemiparesis following cerebral infarction affecting left non-dominant side: Secondary | ICD-10-CM | POA: Diagnosis not present

## 2018-06-16 DIAGNOSIS — I69321 Dysphasia following cerebral infarction: Secondary | ICD-10-CM | POA: Diagnosis not present

## 2018-06-16 DIAGNOSIS — G309 Alzheimer's disease, unspecified: Secondary | ICD-10-CM | POA: Diagnosis not present

## 2018-06-16 DIAGNOSIS — I2510 Atherosclerotic heart disease of native coronary artery without angina pectoris: Secondary | ICD-10-CM | POA: Diagnosis not present

## 2018-06-16 DIAGNOSIS — I69354 Hemiplegia and hemiparesis following cerebral infarction affecting left non-dominant side: Secondary | ICD-10-CM | POA: Diagnosis not present

## 2018-06-16 DIAGNOSIS — M810 Age-related osteoporosis without current pathological fracture: Secondary | ICD-10-CM | POA: Diagnosis not present

## 2018-06-16 DIAGNOSIS — I480 Paroxysmal atrial fibrillation: Secondary | ICD-10-CM | POA: Diagnosis not present

## 2018-06-16 DIAGNOSIS — F0390 Unspecified dementia without behavioral disturbance: Secondary | ICD-10-CM | POA: Diagnosis not present

## 2018-06-16 DIAGNOSIS — N183 Chronic kidney disease, stage 3 (moderate): Secondary | ICD-10-CM | POA: Diagnosis not present

## 2018-06-16 DIAGNOSIS — S0990XA Unspecified injury of head, initial encounter: Secondary | ICD-10-CM | POA: Diagnosis not present

## 2018-06-16 DIAGNOSIS — S80812A Abrasion, left lower leg, initial encounter: Secondary | ICD-10-CM | POA: Diagnosis not present

## 2018-06-16 DIAGNOSIS — F0281 Dementia in other diseases classified elsewhere with behavioral disturbance: Secondary | ICD-10-CM | POA: Diagnosis not present

## 2018-06-16 DIAGNOSIS — I4891 Unspecified atrial fibrillation: Secondary | ICD-10-CM | POA: Diagnosis not present

## 2018-06-16 DIAGNOSIS — R1312 Dysphagia, oropharyngeal phase: Secondary | ICD-10-CM | POA: Diagnosis not present

## 2018-06-16 DIAGNOSIS — I10 Essential (primary) hypertension: Secondary | ICD-10-CM | POA: Diagnosis not present

## 2018-06-16 DIAGNOSIS — S41102A Unspecified open wound of left upper arm, initial encounter: Secondary | ICD-10-CM | POA: Diagnosis not present

## 2018-06-16 DIAGNOSIS — I69322 Dysarthria following cerebral infarction: Secondary | ICD-10-CM | POA: Diagnosis not present

## 2018-06-16 DIAGNOSIS — I129 Hypertensive chronic kidney disease with stage 1 through stage 4 chronic kidney disease, or unspecified chronic kidney disease: Secondary | ICD-10-CM | POA: Diagnosis not present

## 2018-06-17 DIAGNOSIS — I69321 Dysphasia following cerebral infarction: Secondary | ICD-10-CM | POA: Diagnosis not present

## 2018-06-17 DIAGNOSIS — F0390 Unspecified dementia without behavioral disturbance: Secondary | ICD-10-CM | POA: Diagnosis not present

## 2018-06-17 DIAGNOSIS — I69354 Hemiplegia and hemiparesis following cerebral infarction affecting left non-dominant side: Secondary | ICD-10-CM | POA: Diagnosis not present

## 2018-06-17 DIAGNOSIS — I129 Hypertensive chronic kidney disease with stage 1 through stage 4 chronic kidney disease, or unspecified chronic kidney disease: Secondary | ICD-10-CM | POA: Diagnosis not present

## 2018-06-17 DIAGNOSIS — I480 Paroxysmal atrial fibrillation: Secondary | ICD-10-CM | POA: Diagnosis not present

## 2018-06-17 DIAGNOSIS — I69322 Dysarthria following cerebral infarction: Secondary | ICD-10-CM | POA: Diagnosis not present

## 2018-06-17 DIAGNOSIS — N183 Chronic kidney disease, stage 3 (moderate): Secondary | ICD-10-CM | POA: Diagnosis not present

## 2018-06-17 DIAGNOSIS — M810 Age-related osteoporosis without current pathological fracture: Secondary | ICD-10-CM | POA: Diagnosis not present

## 2018-06-17 DIAGNOSIS — R1312 Dysphagia, oropharyngeal phase: Secondary | ICD-10-CM | POA: Diagnosis not present

## 2018-06-20 DIAGNOSIS — N183 Chronic kidney disease, stage 3 (moderate): Secondary | ICD-10-CM | POA: Diagnosis not present

## 2018-06-20 DIAGNOSIS — R1312 Dysphagia, oropharyngeal phase: Secondary | ICD-10-CM | POA: Diagnosis not present

## 2018-06-20 DIAGNOSIS — F0390 Unspecified dementia without behavioral disturbance: Secondary | ICD-10-CM | POA: Diagnosis not present

## 2018-06-20 DIAGNOSIS — I129 Hypertensive chronic kidney disease with stage 1 through stage 4 chronic kidney disease, or unspecified chronic kidney disease: Secondary | ICD-10-CM | POA: Diagnosis not present

## 2018-06-20 DIAGNOSIS — I69354 Hemiplegia and hemiparesis following cerebral infarction affecting left non-dominant side: Secondary | ICD-10-CM | POA: Diagnosis not present

## 2018-06-20 DIAGNOSIS — I69321 Dysphasia following cerebral infarction: Secondary | ICD-10-CM | POA: Diagnosis not present

## 2018-06-20 DIAGNOSIS — I69322 Dysarthria following cerebral infarction: Secondary | ICD-10-CM | POA: Diagnosis not present

## 2018-06-20 DIAGNOSIS — I480 Paroxysmal atrial fibrillation: Secondary | ICD-10-CM | POA: Diagnosis not present

## 2018-06-20 DIAGNOSIS — M810 Age-related osteoporosis without current pathological fracture: Secondary | ICD-10-CM | POA: Diagnosis not present

## 2018-06-21 DIAGNOSIS — E785 Hyperlipidemia, unspecified: Secondary | ICD-10-CM | POA: Diagnosis not present

## 2018-06-21 DIAGNOSIS — N183 Chronic kidney disease, stage 3 (moderate): Secondary | ICD-10-CM | POA: Diagnosis not present

## 2018-06-21 DIAGNOSIS — E559 Vitamin D deficiency, unspecified: Secondary | ICD-10-CM | POA: Diagnosis not present

## 2018-06-21 DIAGNOSIS — M810 Age-related osteoporosis without current pathological fracture: Secondary | ICD-10-CM | POA: Diagnosis not present

## 2018-06-21 DIAGNOSIS — I69321 Dysphasia following cerebral infarction: Secondary | ICD-10-CM | POA: Diagnosis not present

## 2018-06-21 DIAGNOSIS — I129 Hypertensive chronic kidney disease with stage 1 through stage 4 chronic kidney disease, or unspecified chronic kidney disease: Secondary | ICD-10-CM | POA: Diagnosis not present

## 2018-06-21 DIAGNOSIS — E039 Hypothyroidism, unspecified: Secondary | ICD-10-CM | POA: Diagnosis not present

## 2018-06-21 DIAGNOSIS — I69354 Hemiplegia and hemiparesis following cerebral infarction affecting left non-dominant side: Secondary | ICD-10-CM | POA: Diagnosis not present

## 2018-06-21 DIAGNOSIS — R1312 Dysphagia, oropharyngeal phase: Secondary | ICD-10-CM | POA: Diagnosis not present

## 2018-06-21 DIAGNOSIS — D519 Vitamin B12 deficiency anemia, unspecified: Secondary | ICD-10-CM | POA: Diagnosis not present

## 2018-06-21 DIAGNOSIS — I480 Paroxysmal atrial fibrillation: Secondary | ICD-10-CM | POA: Diagnosis not present

## 2018-06-21 DIAGNOSIS — D649 Anemia, unspecified: Secondary | ICD-10-CM | POA: Diagnosis not present

## 2018-06-21 DIAGNOSIS — I69322 Dysarthria following cerebral infarction: Secondary | ICD-10-CM | POA: Diagnosis not present

## 2018-06-21 DIAGNOSIS — F0390 Unspecified dementia without behavioral disturbance: Secondary | ICD-10-CM | POA: Diagnosis not present

## 2018-06-21 DIAGNOSIS — Z79899 Other long term (current) drug therapy: Secondary | ICD-10-CM | POA: Diagnosis not present

## 2018-06-21 DIAGNOSIS — E119 Type 2 diabetes mellitus without complications: Secondary | ICD-10-CM | POA: Diagnosis not present

## 2018-06-22 DIAGNOSIS — F0390 Unspecified dementia without behavioral disturbance: Secondary | ICD-10-CM | POA: Diagnosis not present

## 2018-06-22 DIAGNOSIS — M810 Age-related osteoporosis without current pathological fracture: Secondary | ICD-10-CM | POA: Diagnosis not present

## 2018-06-22 DIAGNOSIS — N183 Chronic kidney disease, stage 3 (moderate): Secondary | ICD-10-CM | POA: Diagnosis not present

## 2018-06-22 DIAGNOSIS — I69322 Dysarthria following cerebral infarction: Secondary | ICD-10-CM | POA: Diagnosis not present

## 2018-06-22 DIAGNOSIS — I4891 Unspecified atrial fibrillation: Secondary | ICD-10-CM | POA: Diagnosis not present

## 2018-06-22 DIAGNOSIS — I69321 Dysphasia following cerebral infarction: Secondary | ICD-10-CM | POA: Diagnosis not present

## 2018-06-22 DIAGNOSIS — I480 Paroxysmal atrial fibrillation: Secondary | ICD-10-CM | POA: Diagnosis not present

## 2018-06-22 DIAGNOSIS — R1312 Dysphagia, oropharyngeal phase: Secondary | ICD-10-CM | POA: Diagnosis not present

## 2018-06-22 DIAGNOSIS — R4702 Dysphasia: Secondary | ICD-10-CM | POA: Diagnosis not present

## 2018-06-22 DIAGNOSIS — I2510 Atherosclerotic heart disease of native coronary artery without angina pectoris: Secondary | ICD-10-CM | POA: Diagnosis not present

## 2018-06-22 DIAGNOSIS — I639 Cerebral infarction, unspecified: Secondary | ICD-10-CM | POA: Diagnosis not present

## 2018-06-22 DIAGNOSIS — I69354 Hemiplegia and hemiparesis following cerebral infarction affecting left non-dominant side: Secondary | ICD-10-CM | POA: Diagnosis not present

## 2018-06-22 DIAGNOSIS — G309 Alzheimer's disease, unspecified: Secondary | ICD-10-CM | POA: Diagnosis not present

## 2018-06-22 DIAGNOSIS — I10 Essential (primary) hypertension: Secondary | ICD-10-CM | POA: Diagnosis not present

## 2018-06-22 DIAGNOSIS — I129 Hypertensive chronic kidney disease with stage 1 through stage 4 chronic kidney disease, or unspecified chronic kidney disease: Secondary | ICD-10-CM | POA: Diagnosis not present

## 2018-06-23 DIAGNOSIS — I69321 Dysphasia following cerebral infarction: Secondary | ICD-10-CM | POA: Diagnosis not present

## 2018-06-23 DIAGNOSIS — M810 Age-related osteoporosis without current pathological fracture: Secondary | ICD-10-CM | POA: Diagnosis not present

## 2018-06-23 DIAGNOSIS — N183 Chronic kidney disease, stage 3 (moderate): Secondary | ICD-10-CM | POA: Diagnosis not present

## 2018-06-23 DIAGNOSIS — I480 Paroxysmal atrial fibrillation: Secondary | ICD-10-CM | POA: Diagnosis not present

## 2018-06-23 DIAGNOSIS — F0390 Unspecified dementia without behavioral disturbance: Secondary | ICD-10-CM | POA: Diagnosis not present

## 2018-06-23 DIAGNOSIS — I69322 Dysarthria following cerebral infarction: Secondary | ICD-10-CM | POA: Diagnosis not present

## 2018-06-23 DIAGNOSIS — I129 Hypertensive chronic kidney disease with stage 1 through stage 4 chronic kidney disease, or unspecified chronic kidney disease: Secondary | ICD-10-CM | POA: Diagnosis not present

## 2018-06-23 DIAGNOSIS — I69354 Hemiplegia and hemiparesis following cerebral infarction affecting left non-dominant side: Secondary | ICD-10-CM | POA: Diagnosis not present

## 2018-06-23 DIAGNOSIS — R1312 Dysphagia, oropharyngeal phase: Secondary | ICD-10-CM | POA: Diagnosis not present

## 2018-06-27 DIAGNOSIS — M810 Age-related osteoporosis without current pathological fracture: Secondary | ICD-10-CM | POA: Diagnosis not present

## 2018-06-27 DIAGNOSIS — R1312 Dysphagia, oropharyngeal phase: Secondary | ICD-10-CM | POA: Diagnosis not present

## 2018-06-27 DIAGNOSIS — I129 Hypertensive chronic kidney disease with stage 1 through stage 4 chronic kidney disease, or unspecified chronic kidney disease: Secondary | ICD-10-CM | POA: Diagnosis not present

## 2018-06-27 DIAGNOSIS — F0390 Unspecified dementia without behavioral disturbance: Secondary | ICD-10-CM | POA: Diagnosis not present

## 2018-06-27 DIAGNOSIS — I69354 Hemiplegia and hemiparesis following cerebral infarction affecting left non-dominant side: Secondary | ICD-10-CM | POA: Diagnosis not present

## 2018-06-27 DIAGNOSIS — I69321 Dysphasia following cerebral infarction: Secondary | ICD-10-CM | POA: Diagnosis not present

## 2018-06-27 DIAGNOSIS — I480 Paroxysmal atrial fibrillation: Secondary | ICD-10-CM | POA: Diagnosis not present

## 2018-06-27 DIAGNOSIS — N183 Chronic kidney disease, stage 3 (moderate): Secondary | ICD-10-CM | POA: Diagnosis not present

## 2018-06-27 DIAGNOSIS — I69322 Dysarthria following cerebral infarction: Secondary | ICD-10-CM | POA: Diagnosis not present

## 2018-06-28 DIAGNOSIS — I69322 Dysarthria following cerebral infarction: Secondary | ICD-10-CM | POA: Diagnosis not present

## 2018-06-28 DIAGNOSIS — R1312 Dysphagia, oropharyngeal phase: Secondary | ICD-10-CM | POA: Diagnosis not present

## 2018-06-28 DIAGNOSIS — I129 Hypertensive chronic kidney disease with stage 1 through stage 4 chronic kidney disease, or unspecified chronic kidney disease: Secondary | ICD-10-CM | POA: Diagnosis not present

## 2018-06-28 DIAGNOSIS — F0390 Unspecified dementia without behavioral disturbance: Secondary | ICD-10-CM | POA: Diagnosis not present

## 2018-06-28 DIAGNOSIS — I69354 Hemiplegia and hemiparesis following cerebral infarction affecting left non-dominant side: Secondary | ICD-10-CM | POA: Diagnosis not present

## 2018-06-28 DIAGNOSIS — M810 Age-related osteoporosis without current pathological fracture: Secondary | ICD-10-CM | POA: Diagnosis not present

## 2018-06-28 DIAGNOSIS — N183 Chronic kidney disease, stage 3 (moderate): Secondary | ICD-10-CM | POA: Diagnosis not present

## 2018-06-28 DIAGNOSIS — I69321 Dysphasia following cerebral infarction: Secondary | ICD-10-CM | POA: Diagnosis not present

## 2018-06-28 DIAGNOSIS — I480 Paroxysmal atrial fibrillation: Secondary | ICD-10-CM | POA: Diagnosis not present

## 2018-06-29 DIAGNOSIS — I10 Essential (primary) hypertension: Secondary | ICD-10-CM | POA: Diagnosis not present

## 2018-06-29 DIAGNOSIS — I639 Cerebral infarction, unspecified: Secondary | ICD-10-CM | POA: Diagnosis not present

## 2018-06-29 DIAGNOSIS — R1312 Dysphagia, oropharyngeal phase: Secondary | ICD-10-CM | POA: Diagnosis not present

## 2018-06-29 DIAGNOSIS — M810 Age-related osteoporosis without current pathological fracture: Secondary | ICD-10-CM | POA: Diagnosis not present

## 2018-06-29 DIAGNOSIS — I69354 Hemiplegia and hemiparesis following cerebral infarction affecting left non-dominant side: Secondary | ICD-10-CM | POA: Diagnosis not present

## 2018-06-29 DIAGNOSIS — I129 Hypertensive chronic kidney disease with stage 1 through stage 4 chronic kidney disease, or unspecified chronic kidney disease: Secondary | ICD-10-CM | POA: Diagnosis not present

## 2018-06-29 DIAGNOSIS — G309 Alzheimer's disease, unspecified: Secondary | ICD-10-CM | POA: Diagnosis not present

## 2018-06-29 DIAGNOSIS — I480 Paroxysmal atrial fibrillation: Secondary | ICD-10-CM | POA: Diagnosis not present

## 2018-06-29 DIAGNOSIS — R4702 Dysphasia: Secondary | ICD-10-CM | POA: Diagnosis not present

## 2018-06-29 DIAGNOSIS — F0390 Unspecified dementia without behavioral disturbance: Secondary | ICD-10-CM | POA: Diagnosis not present

## 2018-06-29 DIAGNOSIS — I4891 Unspecified atrial fibrillation: Secondary | ICD-10-CM | POA: Diagnosis not present

## 2018-06-29 DIAGNOSIS — F0281 Dementia in other diseases classified elsewhere with behavioral disturbance: Secondary | ICD-10-CM | POA: Diagnosis not present

## 2018-06-29 DIAGNOSIS — I2510 Atherosclerotic heart disease of native coronary artery without angina pectoris: Secondary | ICD-10-CM | POA: Diagnosis not present

## 2018-06-29 DIAGNOSIS — N183 Chronic kidney disease, stage 3 (moderate): Secondary | ICD-10-CM | POA: Diagnosis not present

## 2018-06-29 DIAGNOSIS — I69321 Dysphasia following cerebral infarction: Secondary | ICD-10-CM | POA: Diagnosis not present

## 2018-06-29 DIAGNOSIS — R2242 Localized swelling, mass and lump, left lower limb: Secondary | ICD-10-CM | POA: Diagnosis not present

## 2018-06-29 DIAGNOSIS — I69322 Dysarthria following cerebral infarction: Secondary | ICD-10-CM | POA: Diagnosis not present

## 2018-06-30 DIAGNOSIS — R1312 Dysphagia, oropharyngeal phase: Secondary | ICD-10-CM | POA: Diagnosis not present

## 2018-06-30 DIAGNOSIS — N183 Chronic kidney disease, stage 3 (moderate): Secondary | ICD-10-CM | POA: Diagnosis not present

## 2018-06-30 DIAGNOSIS — I69321 Dysphasia following cerebral infarction: Secondary | ICD-10-CM | POA: Diagnosis not present

## 2018-06-30 DIAGNOSIS — F0390 Unspecified dementia without behavioral disturbance: Secondary | ICD-10-CM | POA: Diagnosis not present

## 2018-06-30 DIAGNOSIS — I69354 Hemiplegia and hemiparesis following cerebral infarction affecting left non-dominant side: Secondary | ICD-10-CM | POA: Diagnosis not present

## 2018-06-30 DIAGNOSIS — I480 Paroxysmal atrial fibrillation: Secondary | ICD-10-CM | POA: Diagnosis not present

## 2018-06-30 DIAGNOSIS — I69322 Dysarthria following cerebral infarction: Secondary | ICD-10-CM | POA: Diagnosis not present

## 2018-06-30 DIAGNOSIS — M810 Age-related osteoporosis without current pathological fracture: Secondary | ICD-10-CM | POA: Diagnosis not present

## 2018-06-30 DIAGNOSIS — I129 Hypertensive chronic kidney disease with stage 1 through stage 4 chronic kidney disease, or unspecified chronic kidney disease: Secondary | ICD-10-CM | POA: Diagnosis not present

## 2018-07-01 ENCOUNTER — Telehealth: Payer: Self-pay | Admitting: Family Medicine

## 2018-07-01 DIAGNOSIS — I10 Essential (primary) hypertension: Secondary | ICD-10-CM | POA: Insufficient documentation

## 2018-07-01 DIAGNOSIS — I63422 Cerebral infarction due to embolism of left anterior cerebral artery: Secondary | ICD-10-CM | POA: Insufficient documentation

## 2018-07-01 DIAGNOSIS — I63433 Cerebral infarction due to embolism of bilateral posterior cerebral arteries: Secondary | ICD-10-CM | POA: Insufficient documentation

## 2018-07-01 DIAGNOSIS — I63411 Cerebral infarction due to embolism of right middle cerebral artery: Secondary | ICD-10-CM | POA: Insufficient documentation

## 2018-07-01 NOTE — Telephone Encounter (Signed)
Please thank her for letting me know and for allowing me to care for her mother.  I hope they are both doing okay and I will definitely miss seeing them.

## 2018-07-01 NOTE — Telephone Encounter (Signed)
Patient daughter [name]) came into office to inform you that her mom has been placed in [facility] [address] [place], [place] [phone]/. She wanted you to know that she will not be able to bring her into the office due to her placement.

## 2018-07-04 DIAGNOSIS — R609 Edema, unspecified: Secondary | ICD-10-CM | POA: Diagnosis not present

## 2018-07-05 DIAGNOSIS — R609 Edema, unspecified: Secondary | ICD-10-CM | POA: Diagnosis not present

## 2018-07-06 DIAGNOSIS — I10 Essential (primary) hypertension: Secondary | ICD-10-CM | POA: Diagnosis not present

## 2018-07-06 DIAGNOSIS — I739 Peripheral vascular disease, unspecified: Secondary | ICD-10-CM | POA: Diagnosis not present

## 2018-07-06 DIAGNOSIS — I4891 Unspecified atrial fibrillation: Secondary | ICD-10-CM | POA: Diagnosis not present

## 2018-07-06 DIAGNOSIS — G309 Alzheimer's disease, unspecified: Secondary | ICD-10-CM | POA: Diagnosis not present

## 2018-07-06 DIAGNOSIS — N183 Chronic kidney disease, stage 3 (moderate): Secondary | ICD-10-CM | POA: Diagnosis not present

## 2018-07-06 DIAGNOSIS — F0281 Dementia in other diseases classified elsewhere with behavioral disturbance: Secondary | ICD-10-CM | POA: Diagnosis not present

## 2018-07-06 DIAGNOSIS — R4702 Dysphasia: Secondary | ICD-10-CM | POA: Diagnosis not present

## 2018-07-06 DIAGNOSIS — I2510 Atherosclerotic heart disease of native coronary artery without angina pectoris: Secondary | ICD-10-CM | POA: Diagnosis not present

## 2018-07-06 DIAGNOSIS — I639 Cerebral infarction, unspecified: Secondary | ICD-10-CM | POA: Diagnosis not present

## 2018-07-07 ENCOUNTER — Ambulatory Visit: Payer: Medicare PPO | Admitting: Cardiovascular Disease

## 2018-07-07 DIAGNOSIS — R1312 Dysphagia, oropharyngeal phase: Secondary | ICD-10-CM | POA: Diagnosis not present

## 2018-07-07 DIAGNOSIS — I480 Paroxysmal atrial fibrillation: Secondary | ICD-10-CM | POA: Diagnosis not present

## 2018-07-07 DIAGNOSIS — I69354 Hemiplegia and hemiparesis following cerebral infarction affecting left non-dominant side: Secondary | ICD-10-CM | POA: Diagnosis not present

## 2018-07-07 DIAGNOSIS — N183 Chronic kidney disease, stage 3 (moderate): Secondary | ICD-10-CM | POA: Diagnosis not present

## 2018-07-07 DIAGNOSIS — I129 Hypertensive chronic kidney disease with stage 1 through stage 4 chronic kidney disease, or unspecified chronic kidney disease: Secondary | ICD-10-CM | POA: Diagnosis not present

## 2018-07-07 DIAGNOSIS — F0390 Unspecified dementia without behavioral disturbance: Secondary | ICD-10-CM | POA: Diagnosis not present

## 2018-07-07 DIAGNOSIS — M810 Age-related osteoporosis without current pathological fracture: Secondary | ICD-10-CM | POA: Diagnosis not present

## 2018-07-07 DIAGNOSIS — I69322 Dysarthria following cerebral infarction: Secondary | ICD-10-CM | POA: Diagnosis not present

## 2018-07-07 DIAGNOSIS — I69321 Dysphasia following cerebral infarction: Secondary | ICD-10-CM | POA: Diagnosis not present

## 2018-07-09 DIAGNOSIS — R1312 Dysphagia, oropharyngeal phase: Secondary | ICD-10-CM | POA: Diagnosis not present

## 2018-07-09 DIAGNOSIS — F0390 Unspecified dementia without behavioral disturbance: Secondary | ICD-10-CM | POA: Diagnosis not present

## 2018-07-09 DIAGNOSIS — I69321 Dysphasia following cerebral infarction: Secondary | ICD-10-CM | POA: Diagnosis not present

## 2018-07-09 DIAGNOSIS — I129 Hypertensive chronic kidney disease with stage 1 through stage 4 chronic kidney disease, or unspecified chronic kidney disease: Secondary | ICD-10-CM | POA: Diagnosis not present

## 2018-07-09 DIAGNOSIS — M810 Age-related osteoporosis without current pathological fracture: Secondary | ICD-10-CM | POA: Diagnosis not present

## 2018-07-09 DIAGNOSIS — I69322 Dysarthria following cerebral infarction: Secondary | ICD-10-CM | POA: Diagnosis not present

## 2018-07-09 DIAGNOSIS — I480 Paroxysmal atrial fibrillation: Secondary | ICD-10-CM | POA: Diagnosis not present

## 2018-07-09 DIAGNOSIS — I69354 Hemiplegia and hemiparesis following cerebral infarction affecting left non-dominant side: Secondary | ICD-10-CM | POA: Diagnosis not present

## 2018-07-09 DIAGNOSIS — N183 Chronic kidney disease, stage 3 (moderate): Secondary | ICD-10-CM | POA: Diagnosis not present

## 2018-07-12 DIAGNOSIS — I69321 Dysphasia following cerebral infarction: Secondary | ICD-10-CM | POA: Diagnosis not present

## 2018-07-12 DIAGNOSIS — F0390 Unspecified dementia without behavioral disturbance: Secondary | ICD-10-CM | POA: Diagnosis not present

## 2018-07-12 DIAGNOSIS — N183 Chronic kidney disease, stage 3 (moderate): Secondary | ICD-10-CM | POA: Diagnosis not present

## 2018-07-12 DIAGNOSIS — R1312 Dysphagia, oropharyngeal phase: Secondary | ICD-10-CM | POA: Diagnosis not present

## 2018-07-12 DIAGNOSIS — I129 Hypertensive chronic kidney disease with stage 1 through stage 4 chronic kidney disease, or unspecified chronic kidney disease: Secondary | ICD-10-CM | POA: Diagnosis not present

## 2018-07-12 DIAGNOSIS — M810 Age-related osteoporosis without current pathological fracture: Secondary | ICD-10-CM | POA: Diagnosis not present

## 2018-07-12 DIAGNOSIS — I69322 Dysarthria following cerebral infarction: Secondary | ICD-10-CM | POA: Diagnosis not present

## 2018-07-12 DIAGNOSIS — I69354 Hemiplegia and hemiparesis following cerebral infarction affecting left non-dominant side: Secondary | ICD-10-CM | POA: Diagnosis not present

## 2018-07-12 DIAGNOSIS — I480 Paroxysmal atrial fibrillation: Secondary | ICD-10-CM | POA: Diagnosis not present

## 2018-07-14 DIAGNOSIS — I129 Hypertensive chronic kidney disease with stage 1 through stage 4 chronic kidney disease, or unspecified chronic kidney disease: Secondary | ICD-10-CM | POA: Diagnosis not present

## 2018-07-14 DIAGNOSIS — I69322 Dysarthria following cerebral infarction: Secondary | ICD-10-CM | POA: Diagnosis not present

## 2018-07-14 DIAGNOSIS — I69321 Dysphasia following cerebral infarction: Secondary | ICD-10-CM | POA: Diagnosis not present

## 2018-07-14 DIAGNOSIS — F0390 Unspecified dementia without behavioral disturbance: Secondary | ICD-10-CM | POA: Diagnosis not present

## 2018-07-14 DIAGNOSIS — M810 Age-related osteoporosis without current pathological fracture: Secondary | ICD-10-CM | POA: Diagnosis not present

## 2018-07-14 DIAGNOSIS — N183 Chronic kidney disease, stage 3 (moderate): Secondary | ICD-10-CM | POA: Diagnosis not present

## 2018-07-14 DIAGNOSIS — R1312 Dysphagia, oropharyngeal phase: Secondary | ICD-10-CM | POA: Diagnosis not present

## 2018-07-14 DIAGNOSIS — I480 Paroxysmal atrial fibrillation: Secondary | ICD-10-CM | POA: Diagnosis not present

## 2018-07-14 DIAGNOSIS — I69354 Hemiplegia and hemiparesis following cerebral infarction affecting left non-dominant side: Secondary | ICD-10-CM | POA: Diagnosis not present

## 2018-07-21 ENCOUNTER — Ambulatory Visit: Payer: Medicare PPO | Admitting: Internal Medicine

## 2018-07-21 DIAGNOSIS — M810 Age-related osteoporosis without current pathological fracture: Secondary | ICD-10-CM | POA: Diagnosis not present

## 2018-07-21 DIAGNOSIS — I69321 Dysphasia following cerebral infarction: Secondary | ICD-10-CM | POA: Diagnosis not present

## 2018-07-21 DIAGNOSIS — F0390 Unspecified dementia without behavioral disturbance: Secondary | ICD-10-CM | POA: Diagnosis not present

## 2018-07-21 DIAGNOSIS — R1312 Dysphagia, oropharyngeal phase: Secondary | ICD-10-CM | POA: Diagnosis not present

## 2018-07-21 DIAGNOSIS — N183 Chronic kidney disease, stage 3 (moderate): Secondary | ICD-10-CM | POA: Diagnosis not present

## 2018-07-21 DIAGNOSIS — I480 Paroxysmal atrial fibrillation: Secondary | ICD-10-CM | POA: Diagnosis not present

## 2018-07-21 DIAGNOSIS — I69354 Hemiplegia and hemiparesis following cerebral infarction affecting left non-dominant side: Secondary | ICD-10-CM | POA: Diagnosis not present

## 2018-07-21 DIAGNOSIS — I129 Hypertensive chronic kidney disease with stage 1 through stage 4 chronic kidney disease, or unspecified chronic kidney disease: Secondary | ICD-10-CM | POA: Diagnosis not present

## 2018-07-21 DIAGNOSIS — I69322 Dysarthria following cerebral infarction: Secondary | ICD-10-CM | POA: Diagnosis not present

## 2018-07-28 DIAGNOSIS — R1312 Dysphagia, oropharyngeal phase: Secondary | ICD-10-CM | POA: Diagnosis not present

## 2018-07-28 DIAGNOSIS — I69354 Hemiplegia and hemiparesis following cerebral infarction affecting left non-dominant side: Secondary | ICD-10-CM | POA: Diagnosis not present

## 2018-07-28 DIAGNOSIS — N183 Chronic kidney disease, stage 3 (moderate): Secondary | ICD-10-CM | POA: Diagnosis not present

## 2018-07-28 DIAGNOSIS — I129 Hypertensive chronic kidney disease with stage 1 through stage 4 chronic kidney disease, or unspecified chronic kidney disease: Secondary | ICD-10-CM | POA: Diagnosis not present

## 2018-07-28 DIAGNOSIS — I69321 Dysphasia following cerebral infarction: Secondary | ICD-10-CM | POA: Diagnosis not present

## 2018-07-28 DIAGNOSIS — I480 Paroxysmal atrial fibrillation: Secondary | ICD-10-CM | POA: Diagnosis not present

## 2018-07-28 DIAGNOSIS — I69322 Dysarthria following cerebral infarction: Secondary | ICD-10-CM | POA: Diagnosis not present

## 2018-07-28 DIAGNOSIS — F0390 Unspecified dementia without behavioral disturbance: Secondary | ICD-10-CM | POA: Diagnosis not present

## 2018-07-28 DIAGNOSIS — M810 Age-related osteoporosis without current pathological fracture: Secondary | ICD-10-CM | POA: Diagnosis not present

## 2018-09-15 ENCOUNTER — Ambulatory Visit: Payer: Medicare PPO | Admitting: Adult Health

## 2018-09-28 DIAGNOSIS — I4891 Unspecified atrial fibrillation: Secondary | ICD-10-CM | POA: Diagnosis not present

## 2018-09-28 DIAGNOSIS — N183 Chronic kidney disease, stage 3 (moderate): Secondary | ICD-10-CM | POA: Diagnosis not present

## 2018-09-28 DIAGNOSIS — I639 Cerebral infarction, unspecified: Secondary | ICD-10-CM | POA: Diagnosis not present

## 2018-09-28 DIAGNOSIS — I10 Essential (primary) hypertension: Secondary | ICD-10-CM | POA: Diagnosis not present

## 2018-09-28 DIAGNOSIS — I2510 Atherosclerotic heart disease of native coronary artery without angina pectoris: Secondary | ICD-10-CM | POA: Diagnosis not present

## 2018-09-28 DIAGNOSIS — M810 Age-related osteoporosis without current pathological fracture: Secondary | ICD-10-CM | POA: Diagnosis not present

## 2018-09-28 DIAGNOSIS — R4702 Dysphasia: Secondary | ICD-10-CM | POA: Diagnosis not present

## 2018-09-28 DIAGNOSIS — G309 Alzheimer's disease, unspecified: Secondary | ICD-10-CM | POA: Diagnosis not present

## 2018-09-28 DIAGNOSIS — F0281 Dementia in other diseases classified elsewhere with behavioral disturbance: Secondary | ICD-10-CM | POA: Diagnosis not present

## 2018-09-30 DIAGNOSIS — I129 Hypertensive chronic kidney disease with stage 1 through stage 4 chronic kidney disease, or unspecified chronic kidney disease: Secondary | ICD-10-CM | POA: Diagnosis not present

## 2018-09-30 DIAGNOSIS — I4891 Unspecified atrial fibrillation: Secondary | ICD-10-CM | POA: Diagnosis not present

## 2018-09-30 DIAGNOSIS — F0281 Dementia in other diseases classified elsewhere with behavioral disturbance: Secondary | ICD-10-CM | POA: Diagnosis not present

## 2018-09-30 DIAGNOSIS — R1312 Dysphagia, oropharyngeal phase: Secondary | ICD-10-CM | POA: Diagnosis not present

## 2018-09-30 DIAGNOSIS — M810 Age-related osteoporosis without current pathological fracture: Secondary | ICD-10-CM | POA: Diagnosis not present

## 2018-09-30 DIAGNOSIS — I69391 Dysphagia following cerebral infarction: Secondary | ICD-10-CM | POA: Diagnosis not present

## 2018-09-30 DIAGNOSIS — G309 Alzheimer's disease, unspecified: Secondary | ICD-10-CM | POA: Diagnosis not present

## 2018-09-30 DIAGNOSIS — R2689 Other abnormalities of gait and mobility: Secondary | ICD-10-CM | POA: Diagnosis not present

## 2018-09-30 DIAGNOSIS — N183 Chronic kidney disease, stage 3 (moderate): Secondary | ICD-10-CM | POA: Diagnosis not present

## 2018-10-03 DIAGNOSIS — F0281 Dementia in other diseases classified elsewhere with behavioral disturbance: Secondary | ICD-10-CM | POA: Diagnosis not present

## 2018-10-03 DIAGNOSIS — M810 Age-related osteoporosis without current pathological fracture: Secondary | ICD-10-CM | POA: Diagnosis not present

## 2018-10-03 DIAGNOSIS — G309 Alzheimer's disease, unspecified: Secondary | ICD-10-CM | POA: Diagnosis not present

## 2018-10-03 DIAGNOSIS — R2689 Other abnormalities of gait and mobility: Secondary | ICD-10-CM | POA: Diagnosis not present

## 2018-10-03 DIAGNOSIS — I69391 Dysphagia following cerebral infarction: Secondary | ICD-10-CM | POA: Diagnosis not present

## 2018-10-03 DIAGNOSIS — R1312 Dysphagia, oropharyngeal phase: Secondary | ICD-10-CM | POA: Diagnosis not present

## 2018-10-03 DIAGNOSIS — I4891 Unspecified atrial fibrillation: Secondary | ICD-10-CM | POA: Diagnosis not present

## 2018-10-03 DIAGNOSIS — N183 Chronic kidney disease, stage 3 (moderate): Secondary | ICD-10-CM | POA: Diagnosis not present

## 2018-10-03 DIAGNOSIS — I129 Hypertensive chronic kidney disease with stage 1 through stage 4 chronic kidney disease, or unspecified chronic kidney disease: Secondary | ICD-10-CM | POA: Diagnosis not present

## 2018-10-04 DIAGNOSIS — G309 Alzheimer's disease, unspecified: Secondary | ICD-10-CM | POA: Diagnosis not present

## 2018-10-04 DIAGNOSIS — R2689 Other abnormalities of gait and mobility: Secondary | ICD-10-CM | POA: Diagnosis not present

## 2018-10-04 DIAGNOSIS — R1312 Dysphagia, oropharyngeal phase: Secondary | ICD-10-CM | POA: Diagnosis not present

## 2018-10-04 DIAGNOSIS — I69391 Dysphagia following cerebral infarction: Secondary | ICD-10-CM | POA: Diagnosis not present

## 2018-10-04 DIAGNOSIS — F0281 Dementia in other diseases classified elsewhere with behavioral disturbance: Secondary | ICD-10-CM | POA: Diagnosis not present

## 2018-10-04 DIAGNOSIS — M810 Age-related osteoporosis without current pathological fracture: Secondary | ICD-10-CM | POA: Diagnosis not present

## 2018-10-04 DIAGNOSIS — N183 Chronic kidney disease, stage 3 (moderate): Secondary | ICD-10-CM | POA: Diagnosis not present

## 2018-10-04 DIAGNOSIS — I129 Hypertensive chronic kidney disease with stage 1 through stage 4 chronic kidney disease, or unspecified chronic kidney disease: Secondary | ICD-10-CM | POA: Diagnosis not present

## 2018-10-04 DIAGNOSIS — I4891 Unspecified atrial fibrillation: Secondary | ICD-10-CM | POA: Diagnosis not present

## 2018-10-05 DIAGNOSIS — F0281 Dementia in other diseases classified elsewhere with behavioral disturbance: Secondary | ICD-10-CM | POA: Diagnosis not present

## 2018-10-05 DIAGNOSIS — I69391 Dysphagia following cerebral infarction: Secondary | ICD-10-CM | POA: Diagnosis not present

## 2018-10-05 DIAGNOSIS — R2689 Other abnormalities of gait and mobility: Secondary | ICD-10-CM | POA: Diagnosis not present

## 2018-10-05 DIAGNOSIS — N183 Chronic kidney disease, stage 3 (moderate): Secondary | ICD-10-CM | POA: Diagnosis not present

## 2018-10-05 DIAGNOSIS — G309 Alzheimer's disease, unspecified: Secondary | ICD-10-CM | POA: Diagnosis not present

## 2018-10-05 DIAGNOSIS — I129 Hypertensive chronic kidney disease with stage 1 through stage 4 chronic kidney disease, or unspecified chronic kidney disease: Secondary | ICD-10-CM | POA: Diagnosis not present

## 2018-10-05 DIAGNOSIS — I4891 Unspecified atrial fibrillation: Secondary | ICD-10-CM | POA: Diagnosis not present

## 2018-10-05 DIAGNOSIS — R1312 Dysphagia, oropharyngeal phase: Secondary | ICD-10-CM | POA: Diagnosis not present

## 2018-10-05 DIAGNOSIS — M810 Age-related osteoporosis without current pathological fracture: Secondary | ICD-10-CM | POA: Diagnosis not present

## 2018-10-06 DIAGNOSIS — R1312 Dysphagia, oropharyngeal phase: Secondary | ICD-10-CM | POA: Diagnosis not present

## 2018-10-06 DIAGNOSIS — R2689 Other abnormalities of gait and mobility: Secondary | ICD-10-CM | POA: Diagnosis not present

## 2018-10-06 DIAGNOSIS — G309 Alzheimer's disease, unspecified: Secondary | ICD-10-CM | POA: Diagnosis not present

## 2018-10-06 DIAGNOSIS — I4891 Unspecified atrial fibrillation: Secondary | ICD-10-CM | POA: Diagnosis not present

## 2018-10-06 DIAGNOSIS — F0281 Dementia in other diseases classified elsewhere with behavioral disturbance: Secondary | ICD-10-CM | POA: Diagnosis not present

## 2018-10-06 DIAGNOSIS — N183 Chronic kidney disease, stage 3 (moderate): Secondary | ICD-10-CM | POA: Diagnosis not present

## 2018-10-06 DIAGNOSIS — I129 Hypertensive chronic kidney disease with stage 1 through stage 4 chronic kidney disease, or unspecified chronic kidney disease: Secondary | ICD-10-CM | POA: Diagnosis not present

## 2018-10-06 DIAGNOSIS — I69391 Dysphagia following cerebral infarction: Secondary | ICD-10-CM | POA: Diagnosis not present

## 2018-10-06 DIAGNOSIS — M810 Age-related osteoporosis without current pathological fracture: Secondary | ICD-10-CM | POA: Diagnosis not present

## 2018-10-09 ENCOUNTER — Emergency Department (HOSPITAL_COMMUNITY): Payer: Medicare PPO

## 2018-10-09 ENCOUNTER — Emergency Department (HOSPITAL_COMMUNITY)
Admission: EM | Admit: 2018-10-09 | Discharge: 2018-10-09 | Disposition: A | Discharge status: Home / Self Care | Payer: Medicare PPO | Attending: Emergency Medicine | Admitting: Emergency Medicine

## 2018-10-09 ENCOUNTER — Other Ambulatory Visit: Payer: Self-pay

## 2018-10-09 DIAGNOSIS — Z743 Need for continuous supervision: Secondary | ICD-10-CM | POA: Diagnosis not present

## 2018-10-09 DIAGNOSIS — N390 Urinary tract infection, site not specified: Secondary | ICD-10-CM

## 2018-10-09 DIAGNOSIS — R4182 Altered mental status, unspecified: Secondary | ICD-10-CM | POA: Diagnosis not present

## 2018-10-09 DIAGNOSIS — J9811 Atelectasis: Secondary | ICD-10-CM | POA: Diagnosis not present

## 2018-10-09 DIAGNOSIS — I129 Hypertensive chronic kidney disease with stage 1 through stage 4 chronic kidney disease, or unspecified chronic kidney disease: Secondary | ICD-10-CM | POA: Diagnosis not present

## 2018-10-09 DIAGNOSIS — I10 Essential (primary) hypertension: Secondary | ICD-10-CM | POA: Diagnosis not present

## 2018-10-09 DIAGNOSIS — Z79899 Other long term (current) drug therapy: Secondary | ICD-10-CM | POA: Insufficient documentation

## 2018-10-09 DIAGNOSIS — Z853 Personal history of malignant neoplasm of breast: Secondary | ICD-10-CM | POA: Diagnosis not present

## 2018-10-09 DIAGNOSIS — R0902 Hypoxemia: Secondary | ICD-10-CM | POA: Diagnosis not present

## 2018-10-09 DIAGNOSIS — R5381 Other malaise: Secondary | ICD-10-CM | POA: Diagnosis not present

## 2018-10-09 DIAGNOSIS — R42 Dizziness and giddiness: Secondary | ICD-10-CM | POA: Diagnosis not present

## 2018-10-09 DIAGNOSIS — N183 Chronic kidney disease, stage 3 (moderate): Secondary | ICD-10-CM | POA: Diagnosis not present

## 2018-10-09 DIAGNOSIS — R279 Unspecified lack of coordination: Secondary | ICD-10-CM | POA: Diagnosis not present

## 2018-10-09 LAB — COMPREHENSIVE METABOLIC PANEL
ALT: 17 U/L (ref 0–44)
AST: 18 U/L (ref 15–41)
Albumin: 3.6 g/dL (ref 3.5–5.0)
Alkaline Phosphatase: 38 U/L (ref 38–126)
Anion gap: 10 (ref 5–15)
BUN: 20 mg/dL (ref 8–23)
CO2: 24 mmol/L (ref 22–32)
CREATININE: 1 mg/dL (ref 0.44–1.00)
Calcium: 9.4 mg/dL (ref 8.9–10.3)
Chloride: 106 mmol/L (ref 98–111)
GFR calc Af Amer: 57 mL/min — ABNORMAL LOW (ref >60)
GFR calc non Af Amer: 50 mL/min — ABNORMAL LOW (ref >60)
Glucose, Bld: 108 mg/dL — ABNORMAL HIGH (ref 70–99)
Potassium: 4.6 mmol/L (ref 3.5–5.1)
SODIUM: 140 mmol/L (ref 135–145)
Total Bilirubin: 0.7 mg/dL (ref 0.3–1.2)
Total Protein: 6.4 g/dL — ABNORMAL LOW (ref 6.5–8.1)

## 2018-10-09 LAB — URINALYSIS, ROUTINE W REFLEX MICROSCOPIC
Bilirubin Urine: NEGATIVE
Glucose, UA: NEGATIVE mg/dL
Ketones, ur: 20 mg/dL — AB
Nitrite: POSITIVE — AB
PH: 5 (ref 5.0–8.0)
Protein, ur: NEGATIVE mg/dL
SPECIFIC GRAVITY, URINE: 1.013 (ref 1.005–1.030)

## 2018-10-09 LAB — CBC WITH DIFFERENTIAL/PLATELET
Abs Immature Granulocytes: 0.06 10*3/uL (ref 0.00–0.07)
Basophils Absolute: 0 10*3/uL (ref 0.0–0.1)
Basophils Relative: 0 %
Eosinophils Absolute: 0 10*3/uL (ref 0.0–0.5)
Eosinophils Relative: 0 %
HCT: 42.2 % (ref 36.0–46.0)
Hemoglobin: 13.1 g/dL (ref 12.0–15.0)
Immature Granulocytes: 1 %
Lymphocytes Relative: 29 %
Lymphs Abs: 3 10*3/uL (ref 0.7–4.0)
MCH: 26.8 pg (ref 26.0–34.0)
MCHC: 31 g/dL (ref 30.0–36.0)
MCV: 86.5 fL (ref 80.0–100.0)
Monocytes Absolute: 0.9 10*3/uL (ref 0.1–1.0)
Monocytes Relative: 9 %
Neutro Abs: 6.4 10*3/uL (ref 1.7–7.7)
Neutrophils Relative %: 61 %
Platelets: 251 10*3/uL (ref 150–400)
RBC: 4.88 MIL/uL (ref 3.87–5.11)
RDW: 15.7 % — ABNORMAL HIGH (ref 11.5–15.5)
WBC: 10.5 10*3/uL (ref 4.0–10.5)
nRBC: 0 % (ref 0.0–0.2)

## 2018-10-09 MED ORDER — CEPHALEXIN 500 MG PO CAPS: 500.0000 mg | ORAL_CAPSULE | Freq: Two times a day (BID) | ORAL | 0 refills | Status: DC

## 2018-10-09 MED ORDER — SODIUM CHLORIDE 0.9 % IV SOLN
1.0000 g | Freq: Once | INTRAVENOUS | Status: AC
  Administered 2018-10-09: 1 g via INTRAVENOUS
  Filled 2018-10-09: qty 10

## 2018-10-09 NOTE — ED Triage Notes (Signed)
Pt BIB [facility]. Pt is coming from [facility]. Pt has not been acting herself for the last few days per daughter. Pt reports that she is dizzy and the patient's daughter states that she has been complaining of dizziness for a few days. Pt is wheelchair bound at baseline. Pt did tell her daughter this morning that she was sick to her stomach but did not vomit.

## 2018-10-09 NOTE — ED Notes (Signed)
Pt dressed in paper scrubs for discharge back to [facility]

## 2018-10-09 NOTE — ED Notes (Signed)
Called ptar for pt  

## 2018-10-09 NOTE — ED Provider Notes (Signed)
[facility] EMERGENCY DEPARTMENT Provider Note   CSN: [id number] Arrival date & time: 10/09/18  1452     History   Chief Complaint Chief Complaint  Patient presents with  . Dizziness    HPI Katie Silva is a 85 y.o. female.  85 year old female with past medical history including atrial fibrillation on Eliquis, breast cancer, CVA with left-sided weakness, dementia, CKD, PUD who p/w altered mental status.  Daughter states that for the past few days she has complained of dizziness while sitting in her wheelchair.  She has also seemed off, not acting like herself and less responsive than usual.  Staff at her nursing facility has noted the same.  She seemed persistently altered today which is what prompted them to send her to the ED for evaluation.  Daughter notes that she complained of nausea but has had no vomiting and she is not aware of any diarrhea or cough/cold symptoms.  No known fevers documented at nursing facility.  No recent changes to her medications.  No recent falls.  Patient herself states that she feels "crazy-headed" meaning she feels like she is not thinking clearly. She denies complaints of pain, urinary symptoms, or breathing problems.  Daughter notes she has L sided weakness involving face and extremities since previous stroke.  LEVEL 5 CAVEAT DUE TO DEMENTIA  The history is provided by the patient and a relative.  Dizziness    Past Medical History:  Diagnosis Date  . Anemia   . Arthritis    hands, knees  . Breast cancer, left breast ([place]) 2003   s/p mastectomy, chemo.  Dr. Chancy Milroy oncologist  . Cerebrovascular accident (CVA) due to bilateral embolism of posterior cerebral arteries ([place])   . Cerebrovascular accident (CVA) due to embolism of left anterior cerebral artery ([place])   . Cerebrovascular accident (CVA) due to embolism of right middle cerebral artery ([place])   . Dementia ([place])   . Fracture of humerus, proximal, right, closed 07/2010   s/p hemiarthroplasty  . Gastric ulcer with hemorrhage 02/14/2013  . Helicobacter pylori (H. pylori) infection 02/15/2013   eradication confirmed 05/2013 [facility]  . Hypertension   . Multiple duodenal ulcers 02/14/2013  . New onset atrial fibrillation ([place])   . Osteoporosis   . Stage 3 chronic kidney disease ([place])   . Stroke [facility]) 04/2018    Patient Active Problem List   Diagnosis Date Noted  . Hypertension   . Cerebrovascular accident (CVA) due to embolism of right middle cerebral artery ([place])   . Cerebrovascular accident (CVA) due to embolism of left anterior cerebral artery ([place])   . Cerebrovascular accident (CVA) due to bilateral embolism of posterior cerebral arteries ([place])   . New onset atrial fibrillation ([place]) 05/02/2018  . Acute cardioembolic stroke ([place]) 02/58/5277  . Atrial fibrillation with RVR ([place])   . Cerebrovascular accident (CVA) due to embolism of precerebral artery ([place])   . Diastolic dysfunction   . Benign essential HTN   . Stage 3 chronic kidney disease ([place])   . Stroke ([place]) 04/07/2018  . Urinary frequency 11/29/2017  . Encounter for completion of form with patient 05/31/2017  . Pedal edema 12/31/2015  . Dementia ([place]) 03/14/2015  . Postoperative anemia 04/14/2012  . SVT (supraventricular tachycardia) ([place]) 04/14/2012  . Closed left hip fracture ([place]) 04/13/2012  . HTN (hypertension) 01/30/2011  . Breast cancer, left breast ([place])   . Osteoporosis   . Fracture of humerus, proximal, right, closed 07/08/2010    Past Surgical History:  Procedure Laterality Date  . ABDOMINAL HYSTERECTOMY    . BREATH TEK H PYLORI N/A 06/06/2013   Procedure: BREATH TEK H PYLORI;  Surgeon: Gatha Mayer, MD;  Location: [facility] ENDOSCOPY;  Service: Endoscopy;  Laterality: N/A;  . ESOPHAGOGASTRODUODENOSCOPY N/A 02/14/2013   Procedure: ESOPHAGOGASTRODUODENOSCOPY (EGD);  Surgeon: Gatha Mayer, MD;  Location: [facility];  Service: Endoscopy;  Laterality: N/A;  . FEMUR IM NAIL  04/13/2012    Procedure: INTRAMEDULLARY (IM) NAIL FEMORAL;  Surgeon: Melina Schools, MD;  Location: [facility] ORS;  Service: Orthopedics;  Laterality: Left;  . HIP SURGERY    . KNEE ARTHROSCOPY Bilateral    [facility]  . MASTECTOMY Left   . SHOULDER HEMI-ARTHROPLASTY Right 07/2010   Dr. Mardelle Matte     OB History   No obstetric history on file.      Home Medications    Prior to Admission medications   Medication Sig Start Date End Date Taking? Authorizing Provider  acetaminophen (TYLENOL) 325 MG tablet Take 650 mg by mouth every 4 (four) hours as needed for mild pain.   Yes [provider]  apixaban (ELIQUIS) 2.5 MG TABS tablet Take 2.5 mg by mouth 2 (two) times daily.   Yes [provider]  apixaban (ELIQUIS) 5 MG TABS tablet Take 1 tablet (5 mg total) by mouth 2 (two) times daily. 05/07/18  Yes Regalado, Belkys A, MD  divalproex (DEPAKOTE) 125 MG DR tablet Take 125 mg by mouth 2 (two) times daily.   Yes [provider]  divalproex (DEPAKOTE) 125 MG DR tablet Take 125 mg by mouth every 12 (twelve) hours as needed (agitation).   Yes [provider]  lisinopril (PRINIVIL,ZESTRIL) 10 MG tablet Take 1 tablet (10 mg total) by mouth daily. 11/16/17  Yes Lucille Passy, MD  metoprolol tartrate (LOPRESSOR) 25 MG tablet Take 0.5 tablets (12.5 mg total) by mouth 2 (two) times daily. 05/07/18  Yes Regalado, Belkys A, MD  Misc Natural Products (OSTEO BI-FLEX JOINT SHIELD PO) Take 1 tablet by mouth daily.   Yes [provider]  Multiple Vitamin (MULTIVITAMIN WITH MINERALS) TABS tablet Take 1 tablet by mouth daily.   Yes [provider]  pravastatin (PRAVACHOL) 20 MG tablet Take 1 tablet (20 mg total) by mouth daily at 6 PM. 05/07/18  Yes Regalado, Belkys A, MD  UNABLE TO FIND Apply topically 2 (two) times daily. Med Name: Skin Prep Spray to bilateral heels and left ankle   Yes [provider]  cephALEXin (KEFLEX) 500 MG capsule Take 1 capsule (500 mg  total) by mouth 2 (two) times daily. 10/09/18   [place]ale Wherry, Wenda Overland, MD    Family History Family History  Problem Relation Age of Onset  . Myasthenia gravis Mother   . Coronary artery disease Father   . Colon cancer Sister   . GER disease Son     Social History Social History   Tobacco Use  . Smoking status: Never Smoker  . Smokeless tobacco: Never Used  Substance Use Topics  . Alcohol use: No  . Drug use: No     Allergies   Fexofenadine hcl; Aricept [donepezil hcl]; Ativan [lorazepam]; Penicillins; and Tylenol [acetaminophen]   Review of Systems Review of Systems  Unable to perform ROS: Mental status change  Neurological: Positive for dizziness.     Physical Exam Updated Vital Signs BP 128/88   Pulse (!) 58   Temp 97.6 F (36.4 C) (Oral)   Resp (!) 21  SpO2 95%   Physical Exam Vitals signs and nursing note reviewed.  Constitutional:      General: She is not in acute distress.    Appearance: She is well-developed.     Comments: Awake, comfortable  HENT:     Head: Normocephalic and atraumatic.     Nose: Nose normal.     Mouth/Throat:     Mouth: Mucous membranes are moist.     Pharynx: Oropharynx is clear.  Eyes:     Extraocular Movements: Extraocular movements intact.     Conjunctiva/sclera: Conjunctivae normal.     Pupils: Pupils are equal, round, and reactive to light.     Comments: Horizontal nystagmus b/l at rest and with EOM  Neck:     Musculoskeletal: Neck supple.  Cardiovascular:     Rate and Rhythm: Regular rhythm. Bradycardia present.     Heart sounds: Normal heart sounds. No murmur.  Pulmonary:     Effort: Pulmonary effort is normal. No respiratory distress.     Breath sounds: Normal breath sounds.  Abdominal:     General: Bowel sounds are normal. There is no distension.     Palpations: Abdomen is soft.     Tenderness: There is no abdominal tenderness.  Musculoskeletal:        General: No swelling or tenderness.  Skin:    General:  Skin is warm and dry.  Neurological:     Cranial Nerves: No cranial nerve deficit.     Motor: No abnormal muscle tone.     Deep Tendon Reflexes: Reflexes are normal and symmetric.     Comments: Fluent speech, oriented to person and place, following commands; normal finger-to-nose testing R with dysmetria L, no clonus 5/5 strength BUE, RLE, 3/5 strength flexion at L hip w/ 5/5 strength dorsi/plantar flexion L foot normal sensation x all 4 extremities  Psychiatric:        Thought Content: Thought content normal.        Judgment: Judgment normal.      ED Treatments / Results  Labs (all labs ordered are listed, but only abnormal results are displayed) Labs Reviewed  URINALYSIS, ROUTINE W REFLEX MICROSCOPIC - Abnormal; Notable for the following components:      Result Value   APPearance CLOUDY (*)    Hgb urine dipstick SMALL (*)    Ketones, ur 20 (*)    Nitrite POSITIVE (*)    Leukocytes, UA LARGE (*)    WBC, UA >50 (*)    Bacteria, UA RARE (*)    All other components within normal limits  COMPREHENSIVE METABOLIC PANEL - Abnormal; Notable for the following components:   Glucose, Bld 108 (*)    Total Protein 6.4 (*)    GFR calc non Af Amer 50 (*)    GFR calc Af Amer 57 (*)    All other components within normal limits  CBC WITH DIFFERENTIAL/PLATELET - Abnormal; Notable for the following components:   RDW 15.7 (*)    All other components within normal limits  URINE CULTURE    EKG EKG Interpretation  Date/Time:  Sunday October 09 2018 16:24:30 EST Ventricular Rate:  56 PR Interval:    QRS Duration: 118 QT Interval:  464 QTC Calculation: 448 R Axis:   51 Text Interpretation:  Sinus rhythm Left ventricular hypertrophy Anterior infarct, old slightly more peaked T waves compared to previous, otherwise similar Confirmed by Theotis Burrow [phone]) on 10/09/2018 5:14:10 PM   Radiology Dg Chest 2 View  Result Date:  10/09/2018 CLINICAL DATA:  Altered mental status EXAM: CHEST - 2  VIEW COMPARISON:  05/06/2018 and prior chest radiographs FINDINGS: Cardiomegaly again noted. Mild LEFT basilar atelectasis is identified. There is no evidence of focal airspace disease, pulmonary edema, suspicious pulmonary nodule/mass, pleural effusion, or pneumothorax. No acute bony abnormalities are identified. IMPRESSION: Cardiomegaly with mild bibasilar atelectasis Electronically Signed   By: Margarette Canada M.D.   On: 10/09/2018 16:18   Ct Head Wo Contrast  Result Date: 10/09/2018 CLINICAL DATA:  85 year old female with altered mental status. EXAM: CT HEAD WITHOUT CONTRAST TECHNIQUE: Contiguous axial images were obtained from the base of the skull through the vertex without intravenous contrast. COMPARISON:  06/12/2018 and prior CTs FINDINGS: Brain: No evidence of acute infarction, hemorrhage, hydrocephalus, extra-axial collection or mass lesion/mass effect. Atrophy, chronic small-vessel white matter ischemic changes and remote lacunar infarcts in the basal ganglia, white matter and pons again noted. Vascular: Carotid and vertebral atherosclerotic calcifications noted. Skull: Normal. Negative for fracture or focal lesion. Sinuses/Orbits: No acute abnormality Other: None IMPRESSION: 1. No evidence of acute intracranial abnormality. 2. Atrophy, chronic small-vessel white matter ischemic changes and remote lacunar infarcts. Electronically Signed   By: Margarette Canada M.D.   On: 10/09/2018 16:10    Procedures Procedures (including critical care time)  Medications Ordered in ED Medications  cefTRIAXone (ROCEPHIN) 1 g in sodium chloride 0.9 % 100 mL IVPB (0 g Intravenous Stopped 10/09/18 2135)     Initial Impression / Assessment and Plan / ED Course  I have reviewed the triage vital signs and the nursing notes.  Pertinent labs & imaging results that were available during my care of the patient were reviewed by me and considered in my medical decision making (see chart for details).    She was quiet but  awake on exam, afebrile, stable VS. No focal areas of pain, neuro exam appears baseline compared to previous neurology notes in chart.  CMP and CBC reassuring.  UA is consistent with infection.  Urine culture sent.  Gave a dose of ceftriaxone.  Chest x-ray without acute infiltrate.  Head CT negative acute.  Based on patient's abnormal UA, I suspect that urinary tract infection may be driving her change from baseline behavior.  She remains alert with stable vital signs on reassessment.  I discussed with daughters are options for treatment including overnight observation versus discharge home with close PCP monitoring.  They feel comfortable with sending patient back to her nursing facility with prescription and having PCP recheck her this week.  I feel this is a reasonable option given her risk of sundowning and exposure to illness while in the hospital.  I have extensively reviewed return precautions with them and they have voiced understanding.  Final Clinical Impressions(s) / ED Diagnoses   Final diagnoses:  Urinary tract infection without hematuria, site unspecified  Altered mental status, unspecified altered mental status type    ED Discharge Orders         Ordered    cephALEXin (KEFLEX) 500 MG capsule  2 times daily     10/09/18 2131           Jazae Gandolfi, Wenda Overland, MD 10/09/18 2200

## 2018-10-09 NOTE — ED Notes (Signed)
Pt given turkey sandwich bag 

## 2018-10-11 ENCOUNTER — Ambulatory Visit: Payer: Medicare PPO | Admitting: Adult Health

## 2018-10-11 DIAGNOSIS — N183 Chronic kidney disease, stage 3 (moderate): Secondary | ICD-10-CM | POA: Diagnosis not present

## 2018-10-11 DIAGNOSIS — I4891 Unspecified atrial fibrillation: Secondary | ICD-10-CM | POA: Diagnosis not present

## 2018-10-11 DIAGNOSIS — R1312 Dysphagia, oropharyngeal phase: Secondary | ICD-10-CM | POA: Diagnosis not present

## 2018-10-11 DIAGNOSIS — I69391 Dysphagia following cerebral infarction: Secondary | ICD-10-CM | POA: Diagnosis not present

## 2018-10-11 DIAGNOSIS — I129 Hypertensive chronic kidney disease with stage 1 through stage 4 chronic kidney disease, or unspecified chronic kidney disease: Secondary | ICD-10-CM | POA: Diagnosis not present

## 2018-10-11 DIAGNOSIS — G309 Alzheimer's disease, unspecified: Secondary | ICD-10-CM | POA: Diagnosis not present

## 2018-10-11 DIAGNOSIS — M810 Age-related osteoporosis without current pathological fracture: Secondary | ICD-10-CM | POA: Diagnosis not present

## 2018-10-11 DIAGNOSIS — R2689 Other abnormalities of gait and mobility: Secondary | ICD-10-CM | POA: Diagnosis not present

## 2018-10-11 DIAGNOSIS — F0281 Dementia in other diseases classified elsewhere with behavioral disturbance: Secondary | ICD-10-CM | POA: Diagnosis not present

## 2018-10-11 LAB — URINE CULTURE

## 2018-10-12 ENCOUNTER — Telehealth: Payer: Self-pay

## 2018-10-12 NOTE — Telephone Encounter (Incomplete)
Post ED Visit - Positive Culture Follow-up  Culture report reviewed by antimicrobial stewardship pharmacist:  []  Elenor Quinones, Pharm.D. []  Heide Guile, Pharm.D., BCPS AQ-ID []  Parks Neptune, Pharm.D., BCPS []  Alycia Rossetti, Pharm.D., BCPS []  [place], Pharm.D., BCPS, AAHIVP []  Legrand Como, Pharm.D., BCPS, AAHIVP []  Salome Arnt, PharmD, BCPS []  Johnnette Gourd, PharmD, BCPS []  Hughes Better, PharmD, BCPS []  Leeroy Cha, PharmD Jerene Bears Pharm D Positive urine culture Treated with Cephalexin, organism sensitive to the same and no further patient follow-up is required at this time.  Genia Del 10/12/2018, 12:56 PM

## 2018-10-13 DIAGNOSIS — R1312 Dysphagia, oropharyngeal phase: Secondary | ICD-10-CM | POA: Diagnosis not present

## 2018-10-13 DIAGNOSIS — M810 Age-related osteoporosis without current pathological fracture: Secondary | ICD-10-CM | POA: Diagnosis not present

## 2018-10-13 DIAGNOSIS — R2689 Other abnormalities of gait and mobility: Secondary | ICD-10-CM | POA: Diagnosis not present

## 2018-10-13 DIAGNOSIS — I129 Hypertensive chronic kidney disease with stage 1 through stage 4 chronic kidney disease, or unspecified chronic kidney disease: Secondary | ICD-10-CM | POA: Diagnosis not present

## 2018-10-13 DIAGNOSIS — N183 Chronic kidney disease, stage 3 (moderate): Secondary | ICD-10-CM | POA: Diagnosis not present

## 2018-10-13 DIAGNOSIS — I4891 Unspecified atrial fibrillation: Secondary | ICD-10-CM | POA: Diagnosis not present

## 2018-10-13 DIAGNOSIS — F0281 Dementia in other diseases classified elsewhere with behavioral disturbance: Secondary | ICD-10-CM | POA: Diagnosis not present

## 2018-10-13 DIAGNOSIS — G309 Alzheimer's disease, unspecified: Secondary | ICD-10-CM | POA: Diagnosis not present

## 2018-10-13 DIAGNOSIS — I69391 Dysphagia following cerebral infarction: Secondary | ICD-10-CM | POA: Diagnosis not present

## 2018-10-18 DIAGNOSIS — I129 Hypertensive chronic kidney disease with stage 1 through stage 4 chronic kidney disease, or unspecified chronic kidney disease: Secondary | ICD-10-CM | POA: Diagnosis not present

## 2018-10-18 DIAGNOSIS — R1312 Dysphagia, oropharyngeal phase: Secondary | ICD-10-CM | POA: Diagnosis not present

## 2018-10-18 DIAGNOSIS — I4891 Unspecified atrial fibrillation: Secondary | ICD-10-CM | POA: Diagnosis not present

## 2018-10-18 DIAGNOSIS — N183 Chronic kidney disease, stage 3 (moderate): Secondary | ICD-10-CM | POA: Diagnosis not present

## 2018-10-18 DIAGNOSIS — F0281 Dementia in other diseases classified elsewhere with behavioral disturbance: Secondary | ICD-10-CM | POA: Diagnosis not present

## 2018-10-18 DIAGNOSIS — R2689 Other abnormalities of gait and mobility: Secondary | ICD-10-CM | POA: Diagnosis not present

## 2018-10-18 DIAGNOSIS — I69391 Dysphagia following cerebral infarction: Secondary | ICD-10-CM | POA: Diagnosis not present

## 2018-10-18 DIAGNOSIS — G309 Alzheimer's disease, unspecified: Secondary | ICD-10-CM | POA: Diagnosis not present

## 2018-10-18 DIAGNOSIS — M810 Age-related osteoporosis without current pathological fracture: Secondary | ICD-10-CM | POA: Diagnosis not present

## 2018-10-20 DIAGNOSIS — I4891 Unspecified atrial fibrillation: Secondary | ICD-10-CM | POA: Diagnosis not present

## 2018-10-20 DIAGNOSIS — M810 Age-related osteoporosis without current pathological fracture: Secondary | ICD-10-CM | POA: Diagnosis not present

## 2018-10-20 DIAGNOSIS — R2689 Other abnormalities of gait and mobility: Secondary | ICD-10-CM | POA: Diagnosis not present

## 2018-10-20 DIAGNOSIS — F0281 Dementia in other diseases classified elsewhere with behavioral disturbance: Secondary | ICD-10-CM | POA: Diagnosis not present

## 2018-10-20 DIAGNOSIS — R1312 Dysphagia, oropharyngeal phase: Secondary | ICD-10-CM | POA: Diagnosis not present

## 2018-10-20 DIAGNOSIS — I129 Hypertensive chronic kidney disease with stage 1 through stage 4 chronic kidney disease, or unspecified chronic kidney disease: Secondary | ICD-10-CM | POA: Diagnosis not present

## 2018-10-20 DIAGNOSIS — G309 Alzheimer's disease, unspecified: Secondary | ICD-10-CM | POA: Diagnosis not present

## 2018-10-20 DIAGNOSIS — N183 Chronic kidney disease, stage 3 (moderate): Secondary | ICD-10-CM | POA: Diagnosis not present

## 2018-10-20 DIAGNOSIS — I69391 Dysphagia following cerebral infarction: Secondary | ICD-10-CM | POA: Diagnosis not present

## 2018-10-21 DIAGNOSIS — F0281 Dementia in other diseases classified elsewhere with behavioral disturbance: Secondary | ICD-10-CM | POA: Diagnosis not present

## 2018-10-21 DIAGNOSIS — I4891 Unspecified atrial fibrillation: Secondary | ICD-10-CM | POA: Diagnosis not present

## 2018-10-21 DIAGNOSIS — I129 Hypertensive chronic kidney disease with stage 1 through stage 4 chronic kidney disease, or unspecified chronic kidney disease: Secondary | ICD-10-CM | POA: Diagnosis not present

## 2018-10-21 DIAGNOSIS — M810 Age-related osteoporosis without current pathological fracture: Secondary | ICD-10-CM | POA: Diagnosis not present

## 2018-10-21 DIAGNOSIS — R1312 Dysphagia, oropharyngeal phase: Secondary | ICD-10-CM | POA: Diagnosis not present

## 2018-10-21 DIAGNOSIS — G309 Alzheimer's disease, unspecified: Secondary | ICD-10-CM | POA: Diagnosis not present

## 2018-10-21 DIAGNOSIS — N183 Chronic kidney disease, stage 3 (moderate): Secondary | ICD-10-CM | POA: Diagnosis not present

## 2018-10-21 DIAGNOSIS — R2689 Other abnormalities of gait and mobility: Secondary | ICD-10-CM | POA: Diagnosis not present

## 2018-10-21 DIAGNOSIS — I69391 Dysphagia following cerebral infarction: Secondary | ICD-10-CM | POA: Diagnosis not present

## 2018-10-24 DIAGNOSIS — N183 Chronic kidney disease, stage 3 (moderate): Secondary | ICD-10-CM | POA: Diagnosis not present

## 2018-10-24 DIAGNOSIS — M810 Age-related osteoporosis without current pathological fracture: Secondary | ICD-10-CM | POA: Diagnosis not present

## 2018-10-24 DIAGNOSIS — I4891 Unspecified atrial fibrillation: Secondary | ICD-10-CM | POA: Diagnosis not present

## 2018-10-24 DIAGNOSIS — R1312 Dysphagia, oropharyngeal phase: Secondary | ICD-10-CM | POA: Diagnosis not present

## 2018-10-24 DIAGNOSIS — I129 Hypertensive chronic kidney disease with stage 1 through stage 4 chronic kidney disease, or unspecified chronic kidney disease: Secondary | ICD-10-CM | POA: Diagnosis not present

## 2018-10-24 DIAGNOSIS — I69391 Dysphagia following cerebral infarction: Secondary | ICD-10-CM | POA: Diagnosis not present

## 2018-10-24 DIAGNOSIS — G309 Alzheimer's disease, unspecified: Secondary | ICD-10-CM | POA: Diagnosis not present

## 2018-10-24 DIAGNOSIS — F0281 Dementia in other diseases classified elsewhere with behavioral disturbance: Secondary | ICD-10-CM | POA: Diagnosis not present

## 2018-10-24 DIAGNOSIS — R2689 Other abnormalities of gait and mobility: Secondary | ICD-10-CM | POA: Diagnosis not present

## 2018-10-25 DIAGNOSIS — R2689 Other abnormalities of gait and mobility: Secondary | ICD-10-CM | POA: Diagnosis not present

## 2018-10-25 DIAGNOSIS — G309 Alzheimer's disease, unspecified: Secondary | ICD-10-CM | POA: Diagnosis not present

## 2018-10-25 DIAGNOSIS — F0281 Dementia in other diseases classified elsewhere with behavioral disturbance: Secondary | ICD-10-CM | POA: Diagnosis not present

## 2018-10-25 DIAGNOSIS — N183 Chronic kidney disease, stage 3 (moderate): Secondary | ICD-10-CM | POA: Diagnosis not present

## 2018-10-25 DIAGNOSIS — R1312 Dysphagia, oropharyngeal phase: Secondary | ICD-10-CM | POA: Diagnosis not present

## 2018-10-25 DIAGNOSIS — I69391 Dysphagia following cerebral infarction: Secondary | ICD-10-CM | POA: Diagnosis not present

## 2018-10-25 DIAGNOSIS — M810 Age-related osteoporosis without current pathological fracture: Secondary | ICD-10-CM | POA: Diagnosis not present

## 2018-10-25 DIAGNOSIS — I129 Hypertensive chronic kidney disease with stage 1 through stage 4 chronic kidney disease, or unspecified chronic kidney disease: Secondary | ICD-10-CM | POA: Diagnosis not present

## 2018-10-25 DIAGNOSIS — I4891 Unspecified atrial fibrillation: Secondary | ICD-10-CM | POA: Diagnosis not present

## 2018-11-02 DIAGNOSIS — F0391 Unspecified dementia with behavioral disturbance: Secondary | ICD-10-CM | POA: Diagnosis not present

## 2018-11-02 DIAGNOSIS — F05 Delirium due to known physiological condition: Secondary | ICD-10-CM | POA: Diagnosis not present

## 2018-11-02 DIAGNOSIS — R4182 Altered mental status, unspecified: Secondary | ICD-10-CM | POA: Diagnosis not present

## 2018-12-06 ENCOUNTER — Ambulatory Visit: Payer: Medicare PPO | Admitting: Adult Health

## 2018-12-07 DIAGNOSIS — F0391 Unspecified dementia with behavioral disturbance: Secondary | ICD-10-CM | POA: Diagnosis not present

## 2018-12-07 DIAGNOSIS — L989 Disorder of the skin and subcutaneous tissue, unspecified: Secondary | ICD-10-CM | POA: Diagnosis not present

## 2018-12-27 DIAGNOSIS — R319 Hematuria, unspecified: Secondary | ICD-10-CM | POA: Diagnosis not present

## 2018-12-27 DIAGNOSIS — Z79899 Other long term (current) drug therapy: Secondary | ICD-10-CM | POA: Diagnosis not present

## 2018-12-27 DIAGNOSIS — N390 Urinary tract infection, site not specified: Secondary | ICD-10-CM | POA: Diagnosis not present

## 2018-12-28 DIAGNOSIS — F0391 Unspecified dementia with behavioral disturbance: Secondary | ICD-10-CM | POA: Diagnosis not present

## 2018-12-28 DIAGNOSIS — I480 Paroxysmal atrial fibrillation: Secondary | ICD-10-CM | POA: Diagnosis not present

## 2018-12-28 DIAGNOSIS — I10 Essential (primary) hypertension: Secondary | ICD-10-CM | POA: Diagnosis not present

## 2018-12-28 DIAGNOSIS — N390 Urinary tract infection, site not specified: Secondary | ICD-10-CM | POA: Diagnosis not present

## 2018-12-28 DIAGNOSIS — E785 Hyperlipidemia, unspecified: Secondary | ICD-10-CM | POA: Diagnosis not present

## 2019-01-03 DIAGNOSIS — E119 Type 2 diabetes mellitus without complications: Secondary | ICD-10-CM | POA: Diagnosis not present

## 2019-01-03 DIAGNOSIS — E785 Hyperlipidemia, unspecified: Secondary | ICD-10-CM | POA: Diagnosis not present

## 2019-01-03 DIAGNOSIS — E559 Vitamin D deficiency, unspecified: Secondary | ICD-10-CM | POA: Diagnosis not present

## 2019-01-03 DIAGNOSIS — D519 Vitamin B12 deficiency anemia, unspecified: Secondary | ICD-10-CM | POA: Diagnosis not present

## 2019-01-03 DIAGNOSIS — E039 Hypothyroidism, unspecified: Secondary | ICD-10-CM | POA: Diagnosis not present

## 2019-01-03 DIAGNOSIS — Z79899 Other long term (current) drug therapy: Secondary | ICD-10-CM | POA: Diagnosis not present

## 2019-01-03 DIAGNOSIS — D649 Anemia, unspecified: Secondary | ICD-10-CM | POA: Diagnosis not present

## 2019-01-03 DIAGNOSIS — R569 Unspecified convulsions: Secondary | ICD-10-CM | POA: Diagnosis not present

## 2019-01-11 DIAGNOSIS — N183 Chronic kidney disease, stage 3 (moderate): Secondary | ICD-10-CM | POA: Diagnosis not present

## 2019-01-11 DIAGNOSIS — E785 Hyperlipidemia, unspecified: Secondary | ICD-10-CM | POA: Diagnosis not present

## 2019-01-11 DIAGNOSIS — E039 Hypothyroidism, unspecified: Secondary | ICD-10-CM | POA: Diagnosis not present

## 2019-01-11 DIAGNOSIS — I10 Essential (primary) hypertension: Secondary | ICD-10-CM | POA: Diagnosis not present

## 2019-01-11 DIAGNOSIS — F0391 Unspecified dementia with behavioral disturbance: Secondary | ICD-10-CM | POA: Diagnosis not present

## 2019-01-11 DIAGNOSIS — I480 Paroxysmal atrial fibrillation: Secondary | ICD-10-CM | POA: Diagnosis not present

## 2019-02-15 DIAGNOSIS — E039 Hypothyroidism, unspecified: Secondary | ICD-10-CM | POA: Diagnosis not present

## 2019-02-15 DIAGNOSIS — F0281 Dementia in other diseases classified elsewhere with behavioral disturbance: Secondary | ICD-10-CM | POA: Diagnosis not present

## 2019-02-15 DIAGNOSIS — I480 Paroxysmal atrial fibrillation: Secondary | ICD-10-CM | POA: Diagnosis not present

## 2019-02-15 DIAGNOSIS — I10 Essential (primary) hypertension: Secondary | ICD-10-CM | POA: Diagnosis not present

## 2019-02-15 DIAGNOSIS — E785 Hyperlipidemia, unspecified: Secondary | ICD-10-CM | POA: Diagnosis not present

## 2019-02-15 DIAGNOSIS — N183 Chronic kidney disease, stage 3 (moderate): Secondary | ICD-10-CM | POA: Diagnosis not present

## 2019-02-23 DIAGNOSIS — E039 Hypothyroidism, unspecified: Secondary | ICD-10-CM | POA: Diagnosis not present

## 2019-03-01 DIAGNOSIS — I10 Essential (primary) hypertension: Secondary | ICD-10-CM | POA: Diagnosis not present

## 2019-03-01 DIAGNOSIS — F0281 Dementia in other diseases classified elsewhere with behavioral disturbance: Secondary | ICD-10-CM | POA: Diagnosis not present

## 2019-03-29 DIAGNOSIS — N183 Chronic kidney disease, stage 3 (moderate): Secondary | ICD-10-CM | POA: Diagnosis not present

## 2019-03-29 DIAGNOSIS — I480 Paroxysmal atrial fibrillation: Secondary | ICD-10-CM | POA: Diagnosis not present

## 2019-03-29 DIAGNOSIS — F0281 Dementia in other diseases classified elsewhere with behavioral disturbance: Secondary | ICD-10-CM | POA: Diagnosis not present

## 2019-03-29 DIAGNOSIS — I10 Essential (primary) hypertension: Secondary | ICD-10-CM | POA: Diagnosis not present

## 2019-03-29 DIAGNOSIS — E785 Hyperlipidemia, unspecified: Secondary | ICD-10-CM | POA: Diagnosis not present

## 2019-04-26 DIAGNOSIS — F0391 Unspecified dementia with behavioral disturbance: Secondary | ICD-10-CM | POA: Diagnosis not present

## 2019-04-26 DIAGNOSIS — N183 Chronic kidney disease, stage 3 (moderate): Secondary | ICD-10-CM | POA: Diagnosis not present

## 2019-05-01 ENCOUNTER — Other Ambulatory Visit: Payer: Self-pay

## 2019-05-01 ENCOUNTER — Emergency Department (HOSPITAL_COMMUNITY)
Admission: EM | Admit: 2019-05-01 | Discharge: 2019-05-01 | Disposition: A | Discharge status: Home / Self Care | Payer: Medicare PPO | Attending: Emergency Medicine | Admitting: Emergency Medicine

## 2019-05-01 ENCOUNTER — Emergency Department (HOSPITAL_COMMUNITY): Payer: Medicare PPO

## 2019-05-01 ENCOUNTER — Encounter (HOSPITAL_COMMUNITY): Payer: Self-pay

## 2019-05-01 DIAGNOSIS — W19XXXA Unspecified fall, initial encounter: Secondary | ICD-10-CM | POA: Diagnosis not present

## 2019-05-01 DIAGNOSIS — R52 Pain, unspecified: Secondary | ICD-10-CM | POA: Diagnosis not present

## 2019-05-01 DIAGNOSIS — X58XXXA Exposure to other specified factors, initial encounter: Secondary | ICD-10-CM | POA: Diagnosis not present

## 2019-05-01 DIAGNOSIS — Z79899 Other long term (current) drug therapy: Secondary | ICD-10-CM | POA: Insufficient documentation

## 2019-05-01 DIAGNOSIS — S2020XA Contusion of thorax, unspecified, initial encounter: Secondary | ICD-10-CM | POA: Diagnosis not present

## 2019-05-01 DIAGNOSIS — Y999 Unspecified external cause status: Secondary | ICD-10-CM | POA: Diagnosis not present

## 2019-05-01 DIAGNOSIS — I10 Essential (primary) hypertension: Secondary | ICD-10-CM | POA: Diagnosis not present

## 2019-05-01 DIAGNOSIS — Y92129 Unspecified place in nursing home as the place of occurrence of the external cause: Secondary | ICD-10-CM | POA: Diagnosis not present

## 2019-05-01 DIAGNOSIS — S20212A Contusion of left front wall of thorax, initial encounter: Secondary | ICD-10-CM | POA: Diagnosis not present

## 2019-05-01 DIAGNOSIS — I129 Hypertensive chronic kidney disease with stage 1 through stage 4 chronic kidney disease, or unspecified chronic kidney disease: Secondary | ICD-10-CM | POA: Insufficient documentation

## 2019-05-01 DIAGNOSIS — I499 Cardiac arrhythmia, unspecified: Secondary | ICD-10-CM | POA: Diagnosis not present

## 2019-05-01 DIAGNOSIS — Z7401 Bed confinement status: Secondary | ICD-10-CM | POA: Diagnosis not present

## 2019-05-01 DIAGNOSIS — N183 Chronic kidney disease, stage 3 (moderate): Secondary | ICD-10-CM | POA: Insufficient documentation

## 2019-05-01 DIAGNOSIS — Y939 Activity, unspecified: Secondary | ICD-10-CM | POA: Diagnosis not present

## 2019-05-01 DIAGNOSIS — M2550 Pain in unspecified joint: Secondary | ICD-10-CM | POA: Diagnosis not present

## 2019-05-01 DIAGNOSIS — S299XXA Unspecified injury of thorax, initial encounter: Secondary | ICD-10-CM | POA: Diagnosis present

## 2019-05-01 DIAGNOSIS — Z7901 Long term (current) use of anticoagulants: Secondary | ICD-10-CM | POA: Diagnosis not present

## 2019-05-01 LAB — CBC WITH DIFFERENTIAL/PLATELET
Abs Immature Granulocytes: 0.06 10*3/uL (ref 0.00–0.07)
Basophils Absolute: 0 10*3/uL (ref 0.0–0.1)
Basophils Relative: 0 %
Eosinophils Absolute: 0.1 10*3/uL (ref 0.0–0.5)
Eosinophils Relative: 1 %
HCT: 41.2 % (ref 36.0–46.0)
Hemoglobin: 12.7 g/dL (ref 12.0–15.0)
Immature Granulocytes: 1 %
Lymphocytes Relative: 36 %
Lymphs Abs: 3.6 10*3/uL (ref 0.7–4.0)
MCH: 27.4 pg (ref 26.0–34.0)
MCHC: 30.8 g/dL (ref 30.0–36.0)
MCV: 89 fL (ref 80.0–100.0)
Monocytes Absolute: 1.5 10*3/uL — ABNORMAL HIGH (ref 0.1–1.0)
Monocytes Relative: 15 %
Neutro Abs: 4.8 10*3/uL (ref 1.7–7.7)
Neutrophils Relative %: 47 %
Platelets: 228 10*3/uL (ref 150–400)
RBC: 4.63 MIL/uL (ref 3.87–5.11)
RDW: 16.3 % — ABNORMAL HIGH (ref 11.5–15.5)
WBC: 10.2 10*3/uL (ref 4.0–10.5)
nRBC: 0 % (ref 0.0–0.2)

## 2019-05-01 LAB — BASIC METABOLIC PANEL
Anion gap: 9 (ref 5–15)
BUN: 26 mg/dL — ABNORMAL HIGH (ref 8–23)
CO2: 27 mmol/L (ref 22–32)
Calcium: 9 mg/dL (ref 8.9–10.3)
Chloride: 107 mmol/L (ref 98–111)
Creatinine, Ser: 0.89 mg/dL (ref 0.44–1.00)
GFR calc Af Amer: >60 mL/min (ref >60)
GFR calc non Af Amer: 57 mL/min — ABNORMAL LOW (ref >60)
Glucose, Bld: 102 mg/dL — ABNORMAL HIGH (ref 70–99)
Potassium: 4.3 mmol/L (ref 3.5–5.1)
Sodium: 143 mmol/L (ref 135–145)

## 2019-05-01 NOTE — Discharge Instructions (Addendum)
Please return for any problem.  Follow-up with your regular care provider as instructed.  If bruising worsens or fails to improve, please return to the ED for re-evaluation.   No fracture or lung injury found on Xray of your chest today. No anemia found on your laboratory studies performed today.

## 2019-05-01 NOTE — ED Provider Notes (Signed)
[facility] DEPT Provider Note   CSN: [id number] Arrival date & time: 05/01/19  1014     History   Chief Complaint Chief Complaint  Patient presents with  . Bruising to chest    HPI Katie Silva is a 85 y.o. female.     85 year old female with prior medical history as detailed below presents for evaluation of bruising to the anterior left chest.  Patient resides at [facility].  Staff noted bruising to the left chest wall today.  Patient is denying recent fall.  No fall was witnessed by staff.  Patient is denying current pain or discomfort to the left anterior chest.  She denies other injury.  Of note, patient is on Eliquis.    The history is provided by the patient and medical records.  Illness Severity:  Mild Onset quality:  Unable to specify Timing:  Constant Progression:  Waxing and waning Chronicity:  New Associated symptoms: no chest pain, no fever and no shortness of breath     Past Medical History:  Diagnosis Date  . Anemia   . Arthritis    hands, knees  . Breast cancer, left breast ([place]) 2003   s/p mastectomy, chemo.  Dr. Chancy Milroy oncologist  . Cerebrovascular accident (CVA) due to bilateral embolism of posterior cerebral arteries ([place])   . Cerebrovascular accident (CVA) due to embolism of left anterior cerebral artery ([place])   . Cerebrovascular accident (CVA) due to embolism of right middle cerebral artery ([place])   . Dementia ([place])   . Fracture of humerus, proximal, right, closed 07/2010   s/p hemiarthroplasty  . Gastric ulcer with hemorrhage 02/14/2013  . Helicobacter pylori (H. pylori) infection 02/15/2013   eradication confirmed 05/2013 UBT  . Hypertension   . Multiple duodenal ulcers 02/14/2013  . New onset atrial fibrillation ([place])   . Osteoporosis   . Stage 3 chronic kidney disease ([place])   . Stroke [facility]) 04/2018    Patient Active Problem List   Diagnosis Date Noted  . Hypertension   . Cerebrovascular accident  (CVA) due to embolism of right middle cerebral artery ([place])   . Cerebrovascular accident (CVA) due to embolism of left anterior cerebral artery ([place])   . Cerebrovascular accident (CVA) due to bilateral embolism of posterior cerebral arteries ([place])   . New onset atrial fibrillation ([place]) 05/02/2018  . Acute cardioembolic stroke ([place]) [id number]  . Atrial fibrillation with RVR ([place])   . Cerebrovascular accident (CVA) due to embolism of precerebral artery ([place])   . Diastolic dysfunction   . Benign essential HTN   . Stage 3 chronic kidney disease ([place])   . Stroke ([place]) 04/07/2018  . Urinary frequency 11/29/2017  . Encounter for completion of form with patient 05/31/2017  . Pedal edema 12/31/2015  . Dementia ([place]) 03/14/2015  . Postoperative anemia 04/14/2012  . SVT (supraventricular tachycardia) ([place]) 04/14/2012  . Closed left hip fracture ([place]) 04/13/2012  . HTN (hypertension) 01/30/2011  . Breast cancer, left breast ([place])   . Osteoporosis   . Fracture of humerus, proximal, right, closed 07/08/2010    Past Surgical History:  Procedure Laterality Date  . ABDOMINAL HYSTERECTOMY    . BREATH TEK H PYLORI N/A 06/06/2013   Procedure: BREATH TEK H PYLORI;  Surgeon: Gatha Mayer, MD;  Location: [facility] ENDOSCOPY;  Service: Endoscopy;  Laterality: N/A;  . ESOPHAGOGASTRODUODENOSCOPY N/A 02/14/2013   Procedure: ESOPHAGOGASTRODUODENOSCOPY (EGD);  Surgeon: Gatha Mayer, MD;  Location: [facility];  Service: Endoscopy;  Laterality: N/A;  .  FEMUR IM NAIL  04/13/2012   Procedure: INTRAMEDULLARY (IM) NAIL FEMORAL;  Surgeon: Melina Schools, MD;  Location: [facility] ORS;  Service: Orthopedics;  Laterality: Left;  . HIP SURGERY    . KNEE ARTHROSCOPY Bilateral    [facility]lateral knees at [facility]  . MASTECTOMY Left   . SHOULDER HEMI-ARTHROPLASTY Right 07/2010   Dr. Mardelle Matte     OB History   No obstetric history on file.      Home Medications    Prior to Admission medications   Medication Sig Start Date End Date  Taking? Authorizing Provider  acetaminophen (TYLENOL) 325 MG tablet Take 650 mg by mouth every 4 (four) hours as needed for mild pain.    [provider]  apixaban (ELIQUIS) 2.5 MG TABS tablet Take 2.5 mg by mouth 2 (two) times daily.    [provider]  apixaban (ELIQUIS) 5 MG TABS tablet Take 1 tablet (5 mg total) by mouth 2 (two) times daily. 05/07/18   Regalado, Belkys A, MD  cephALEXin (KEFLEX) 500 MG capsule Take 1 capsule (500 mg total) by mouth 2 (two) times daily. 10/09/18   Little, Wenda Overland, MD  divalproex (DEPAKOTE) 125 MG DR tablet Take 125 mg by mouth 2 (two) times daily.    [provider]  divalproex (DEPAKOTE) 125 MG DR tablet Take 125 mg by mouth every 12 (twelve) hours as needed (agitation).    [provider]  lisinopril (PRINIVIL,ZESTRIL) 10 MG tablet Take 1 tablet (10 mg total) by mouth daily. 11/16/17   Lucille Passy, MD  metoprolol tartrate (LOPRESSOR) 25 MG tablet Take 0.5 tablets (12.5 mg total) by mouth 2 (two) times daily. 05/07/18   Regalado, Cassie Freer, MD  Misc Natural Products (OSTEO BI-FLEX JOINT SHIELD PO) Take 1 tablet by mouth daily.    [provider]  Multiple Vitamin (MULTIVITAMIN WITH MINERALS) TABS tablet Take 1 tablet by mouth daily.    [provider]  pravastatin (PRAVACHOL) 20 MG tablet Take 1 tablet (20 mg total) by mouth daily at 6 PM. 05/07/18   Regalado, Belkys A, MD  UNABLE TO FIND Apply topically 2 (two) times daily. Med Name: Skin Prep Spray to bilateral heels and left ankle    [provider]    Family History Family History  Problem Relation Age of Onset  . Myasthenia gravis Mother   . Coronary artery disease Father   . Colon cancer Sister   . GER disease Son     Social History Social History   Tobacco Use  . Smoking status: Never Smoker  . Smokeless tobacco: Never Used  Substance Use Topics  . Alcohol use: No  . Drug use: No     Allergies   Fexofenadine hcl, Aricept  [donepezil hcl], Ativan [lorazepam], Penicillins, and Tylenol [acetaminophen]   Review of Systems Review of Systems  Constitutional: Negative for fever.  Respiratory: Negative for shortness of breath.   Cardiovascular: Negative for chest pain.  All other systems reviewed and are negative.    Physical Exam Updated Vital Signs BP (!) 145/58   Pulse 64   Temp 97.7 F (36.5 C) (Oral)   Resp 16   SpO2 97%   Physical Exam Vitals signs and nursing note reviewed.  Constitutional:      General: She is not in acute distress.    Appearance: She is well-developed.  HENT:     Head: Normocephalic and atraumatic.  Eyes:     Conjunctiva/sclera: Conjunctivae normal.  Pupils: Pupils are equal, round, and reactive to light.  Neck:     Musculoskeletal: Normal range of motion and neck supple.  Cardiovascular:     Rate and Rhythm: Normal rate and regular rhythm.     Heart sounds: Normal heart sounds.  Pulmonary:     Effort: Pulmonary effort is normal. No respiratory distress.     Breath sounds: Normal breath sounds.  Abdominal:     General: There is no distension.     Palpations: Abdomen is soft.     Tenderness: There is no abdominal tenderness.  Musculoskeletal: Normal range of motion.        General: No deformity.  Skin:    General: Skin is warm and dry.     Comments: Ecchymosis to left anterior chest wall   Neurological:     Mental Status: She is alert and oriented to person, place, and time. Mental status is at baseline.      ED Treatments / Results  Labs (all labs ordered are listed, but only abnormal results are displayed) Labs Reviewed  BASIC METABOLIC PANEL - Abnormal; Notable for the following components:      Result Value   Glucose, Bld 102 (*)    BUN 26 (*)    GFR calc non Af Amer 57 (*)    All other components within normal limits  CBC WITH DIFFERENTIAL/PLATELET - Abnormal; Notable for the following components:   RDW 16.3 (*)    Monocytes Absolute 1.5 (*)     All other components within normal limits    EKG None  Radiology Dg Ribs Unilateral W/chest Left  Result Date: 05/01/2019 CLINICAL DATA:  Upper chest bruising. Evaluation for left rib fracture. EXAM: LEFT RIBS AND CHEST - 3+ VIEW COMPARISON:  Chest radiographs 10/09/2018 FINDINGS: The patient is rotated to the right on the AP chest radiograph with grossly unchanged cardiomediastinal silhouette. Mitral annular calcification is noted. The lungs are mildly hypoinflated without airspace consolidation, edema, sizable pleural effusion, or pneumothorax identified. Left axillary surgical clips and a right shoulder arthroplasty are noted. No displaced rib fracture is identified. IMPRESSION: No displaced rib fracture identified. Electronically Signed   By: Logan Bores M.D.   On: 05/01/2019 12:19    Procedures Procedures (including critical care time)  Medications Ordered in ED Medications - No data to display   Initial Impression / Assessment and Plan / ED Course  I have reviewed the triage vital signs and the nursing notes.  Pertinent labs & imaging results that were available during my care of the patient were reviewed by me and considered in my medical decision making (see chart for details).        MDM  Screen complete  Katie Silva was evaluated in Emergency Department on 05/01/2019 for the symptoms described in the history of present illness. She was evaluated in the context of the global COVID-19 pandemic, which necessitated consideration that the patient might be at risk for infection with the SARS-CoV-2 virus that causes COVID-19. Institutional protocols and algorithms that pertain to the evaluation of patients at risk for COVID-19 are in a state of rapid change based on information released by regulatory bodies including the CDC and federal and state organizations. These policies and algorithms were followed during the patient's care in the ED.   Patient is presenting from  her facility for evaluation of bruising noticed to the anterior left chest wall.  Patient cannot confirm any specific injury.  Patient is on Eliquis.  It  appears that the patient likely had a fairly superficial injury to the left chest wall resulting in hematoma/echymosis.  Screening labs and xray performed today are without significant abnormality.  Patient appears to be comfortable.  She is appropriate for discharge.  Importance of close follow-up stressed. Strict return precautions given and understood.   Final Clinical Impressions(s) / ED Diagnoses   Final diagnoses:  Contusion of left chest wall, initial encounter    ED Discharge Orders    None       Valarie Merino, MD 05/01/19 1244

## 2019-05-01 NOTE — ED Triage Notes (Signed)
EMS reports from [facility], staff noticed bruising to upper chest today, possible fall, would like Pt checked out.  BP 150/81 HR 84 RR 16 Sp02 100 RA

## 2019-05-03 ENCOUNTER — Other Ambulatory Visit: Payer: Self-pay

## 2019-05-03 ENCOUNTER — Observation Stay (HOSPITAL_COMMUNITY)
Admission: EM | Admit: 2019-05-03 | Discharge: 2019-05-05 | Disposition: A | Discharge status: Home / Self Care | Payer: Medicare PPO | Attending: Family Medicine | Admitting: Family Medicine

## 2019-05-03 DIAGNOSIS — I1310 Hypertensive heart and chronic kidney disease without heart failure, with stage 1 through stage 4 chronic kidney disease, or unspecified chronic kidney disease: Secondary | ICD-10-CM | POA: Insufficient documentation

## 2019-05-03 DIAGNOSIS — N183 Chronic kidney disease, stage 3 (moderate): Secondary | ICD-10-CM | POA: Insufficient documentation

## 2019-05-03 DIAGNOSIS — F0391 Unspecified dementia with behavioral disturbance: Secondary | ICD-10-CM | POA: Diagnosis not present

## 2019-05-03 DIAGNOSIS — I712 Thoracic aortic aneurysm, without rupture: Secondary | ICD-10-CM | POA: Diagnosis not present

## 2019-05-03 DIAGNOSIS — M810 Age-related osteoporosis without current pathological fracture: Secondary | ICD-10-CM | POA: Diagnosis not present

## 2019-05-03 DIAGNOSIS — Z8249 Family history of ischemic heart disease and other diseases of the circulatory system: Secondary | ICD-10-CM | POA: Insufficient documentation

## 2019-05-03 DIAGNOSIS — Z853 Personal history of malignant neoplasm of breast: Secondary | ICD-10-CM | POA: Insufficient documentation

## 2019-05-03 DIAGNOSIS — R0789 Other chest pain: Secondary | ICD-10-CM | POA: Diagnosis not present

## 2019-05-03 DIAGNOSIS — I471 Supraventricular tachycardia: Secondary | ICD-10-CM | POA: Diagnosis not present

## 2019-05-03 DIAGNOSIS — Z20828 Contact with and (suspected) exposure to other viral communicable diseases: Secondary | ICD-10-CM | POA: Insufficient documentation

## 2019-05-03 DIAGNOSIS — S20212A Contusion of left front wall of thorax, initial encounter: Secondary | ICD-10-CM | POA: Diagnosis not present

## 2019-05-03 DIAGNOSIS — M170 Bilateral primary osteoarthritis of knee: Secondary | ICD-10-CM | POA: Insufficient documentation

## 2019-05-03 DIAGNOSIS — E785 Hyperlipidemia, unspecified: Secondary | ICD-10-CM | POA: Diagnosis not present

## 2019-05-03 DIAGNOSIS — Z79899 Other long term (current) drug therapy: Secondary | ICD-10-CM | POA: Insufficient documentation

## 2019-05-03 DIAGNOSIS — I480 Paroxysmal atrial fibrillation: Secondary | ICD-10-CM | POA: Insufficient documentation

## 2019-05-03 DIAGNOSIS — K5790 Diverticulosis of intestine, part unspecified, without perforation or abscess without bleeding: Secondary | ICD-10-CM | POA: Insufficient documentation

## 2019-05-03 DIAGNOSIS — Z8711 Personal history of peptic ulcer disease: Secondary | ICD-10-CM | POA: Insufficient documentation

## 2019-05-03 DIAGNOSIS — M19042 Primary osteoarthritis, left hand: Secondary | ICD-10-CM | POA: Insufficient documentation

## 2019-05-03 DIAGNOSIS — F0390 Unspecified dementia without behavioral disturbance: Secondary | ICD-10-CM | POA: Insufficient documentation

## 2019-05-03 DIAGNOSIS — E039 Hypothyroidism, unspecified: Secondary | ICD-10-CM | POA: Diagnosis not present

## 2019-05-03 DIAGNOSIS — Z5111 Encounter for antineoplastic chemotherapy: Secondary | ICD-10-CM | POA: Diagnosis not present

## 2019-05-03 DIAGNOSIS — I4891 Unspecified atrial fibrillation: Secondary | ICD-10-CM | POA: Diagnosis not present

## 2019-05-03 DIAGNOSIS — I10 Essential (primary) hypertension: Secondary | ICD-10-CM | POA: Diagnosis not present

## 2019-05-03 DIAGNOSIS — X58XXXA Exposure to other specified factors, initial encounter: Secondary | ICD-10-CM | POA: Diagnosis not present

## 2019-05-03 DIAGNOSIS — R609 Edema, unspecified: Secondary | ICD-10-CM | POA: Diagnosis not present

## 2019-05-03 DIAGNOSIS — W19XXXA Unspecified fall, initial encounter: Secondary | ICD-10-CM | POA: Diagnosis not present

## 2019-05-03 DIAGNOSIS — M19041 Primary osteoarthritis, right hand: Secondary | ICD-10-CM | POA: Insufficient documentation

## 2019-05-03 DIAGNOSIS — Z9012 Acquired absence of left breast and nipple: Secondary | ICD-10-CM | POA: Insufficient documentation

## 2019-05-03 DIAGNOSIS — Z7901 Long term (current) use of anticoagulants: Secondary | ICD-10-CM | POA: Diagnosis not present

## 2019-05-03 DIAGNOSIS — Z8673 Personal history of transient ischemic attack (TIA), and cerebral infarction without residual deficits: Secondary | ICD-10-CM | POA: Insufficient documentation

## 2019-05-03 DIAGNOSIS — S20219A Contusion of unspecified front wall of thorax, initial encounter: Secondary | ICD-10-CM | POA: Diagnosis not present

## 2019-05-04 ENCOUNTER — Other Ambulatory Visit: Payer: Self-pay

## 2019-05-04 ENCOUNTER — Encounter (HOSPITAL_COMMUNITY): Payer: Self-pay

## 2019-05-04 ENCOUNTER — Emergency Department (HOSPITAL_COMMUNITY): Payer: Medicare PPO

## 2019-05-04 DIAGNOSIS — S20212A Contusion of left front wall of thorax, initial encounter: Secondary | ICD-10-CM | POA: Diagnosis present

## 2019-05-04 DIAGNOSIS — S20219A Contusion of unspecified front wall of thorax, initial encounter: Secondary | ICD-10-CM | POA: Diagnosis not present

## 2019-05-04 LAB — CBC WITH DIFFERENTIAL/PLATELET
Abs Immature Granulocytes: 0.1 10*3/uL — ABNORMAL HIGH (ref 0.00–0.07)
Basophils Absolute: 0 10*3/uL (ref 0.0–0.1)
Basophils Relative: 0 %
Eosinophils Absolute: 0 10*3/uL (ref 0.0–0.5)
Eosinophils Relative: 0 %
HCT: 36.7 % (ref 36.0–46.0)
Hemoglobin: 11.3 g/dL — ABNORMAL LOW (ref 12.0–15.0)
Immature Granulocytes: 1 %
Lymphocytes Relative: 17 %
Lymphs Abs: 2.1 10*3/uL (ref 0.7–4.0)
MCH: 27.6 pg (ref 26.0–34.0)
MCHC: 30.8 g/dL (ref 30.0–36.0)
MCV: 89.5 fL (ref 80.0–100.0)
Monocytes Absolute: 1.2 10*3/uL — ABNORMAL HIGH (ref 0.1–1.0)
Monocytes Relative: 10 %
Neutro Abs: 8.7 10*3/uL — ABNORMAL HIGH (ref 1.7–7.7)
Neutrophils Relative %: 72 %
Platelets: 232 10*3/uL (ref 150–400)
RBC: 4.1 MIL/uL (ref 3.87–5.11)
RDW: 16.1 % — ABNORMAL HIGH (ref 11.5–15.5)
WBC: 12.1 10*3/uL — ABNORMAL HIGH (ref 4.0–10.5)
nRBC: 0 % (ref 0.0–0.2)

## 2019-05-04 LAB — URINALYSIS, ROUTINE W REFLEX MICROSCOPIC
Bilirubin Urine: NEGATIVE
Glucose, UA: NEGATIVE mg/dL
Hgb urine dipstick: NEGATIVE
Ketones, ur: 5 mg/dL — AB
Nitrite: POSITIVE — AB
Protein, ur: NEGATIVE mg/dL
Specific Gravity, Urine: >1.046 — ABNORMAL HIGH (ref 1.005–1.030)
pH: 5 (ref 5.0–8.0)

## 2019-05-04 LAB — CK: Total CK: 121 U/L (ref 38–234)

## 2019-05-04 LAB — HEMOGLOBIN AND HEMATOCRIT, BLOOD
HCT: 30.5 % — ABNORMAL LOW (ref 36.0–46.0)
Hemoglobin: 9.5 g/dL — ABNORMAL LOW (ref 12.0–15.0)

## 2019-05-04 LAB — BASIC METABOLIC PANEL
Anion gap: 12 (ref 5–15)
BUN: 29 mg/dL — ABNORMAL HIGH (ref 8–23)
CO2: 21 mmol/L — ABNORMAL LOW (ref 22–32)
Calcium: 8.6 mg/dL — ABNORMAL LOW (ref 8.9–10.3)
Chloride: 106 mmol/L (ref 98–111)
Creatinine, Ser: 0.78 mg/dL (ref 0.44–1.00)
GFR calc Af Amer: >60 mL/min (ref >60)
GFR calc non Af Amer: >60 mL/min (ref >60)
Glucose, Bld: 145 mg/dL — ABNORMAL HIGH (ref 70–99)
Potassium: 4.1 mmol/L (ref 3.5–5.1)
Sodium: 139 mmol/L (ref 135–145)

## 2019-05-04 LAB — SARS CORONAVIRUS 2 BY RT PCR (HOSPITAL ORDER, PERFORMED IN ~~LOC~~ HOSPITAL LAB): SARS Coronavirus 2: NEGATIVE

## 2019-05-04 MED ORDER — IOHEXOL 300 MG/ML  SOLN
75.0000 mL | Freq: Once | INTRAMUSCULAR | Status: AC | PRN
  Administered 2019-05-04: 75 mL via INTRAVENOUS

## 2019-05-04 MED ORDER — ACETAMINOPHEN 325 MG PO TABS
650.0000 mg | ORAL_TABLET | ORAL | Status: DC | PRN
  Administered 2019-05-04 (×2): 650 mg via ORAL
  Filled 2019-05-04: qty 2

## 2019-05-04 MED ORDER — PRAVASTATIN SODIUM 20 MG PO TABS
20.0000 mg | ORAL_TABLET | Freq: Every day | ORAL | Status: DC
  Administered 2019-05-04: 20 mg via ORAL
  Filled 2019-05-04: qty 1

## 2019-05-04 MED ORDER — METOPROLOL TARTRATE 12.5 MG HALF TABLET
12.5000 mg | ORAL_TABLET | Freq: Two times a day (BID) | ORAL | Status: DC
  Administered 2019-05-04 – 2019-05-05 (×3): 12.5 mg via ORAL
  Filled 2019-05-04 (×3): qty 1

## 2019-05-04 MED ORDER — DIVALPROEX SODIUM 125 MG PO DR TAB
125.0000 mg | DELAYED_RELEASE_TABLET | Freq: Two times a day (BID) | ORAL | Status: DC | PRN
  Filled 2019-05-04: qty 1

## 2019-05-04 MED ORDER — SODIUM CHLORIDE (PF) 0.9 % IJ SOLN
INTRAMUSCULAR | Status: AC
  Filled 2019-05-04: qty 50

## 2019-05-04 MED ORDER — TRAMADOL HCL 50 MG PO TABS
50.0000 mg | ORAL_TABLET | Freq: Four times a day (QID) | ORAL | Status: DC | PRN
  Administered 2019-05-04: 50 mg via ORAL
  Filled 2019-05-04: qty 1

## 2019-05-04 MED ORDER — LISINOPRIL 10 MG PO TABS
10.0000 mg | ORAL_TABLET | Freq: Every day | ORAL | Status: DC
  Administered 2019-05-04 – 2019-05-05 (×2): 10 mg via ORAL
  Filled 2019-05-04 (×2): qty 1

## 2019-05-04 MED ORDER — DIVALPROEX SODIUM 125 MG PO DR TAB
125.0000 mg | DELAYED_RELEASE_TABLET | Freq: Two times a day (BID) | ORAL | Status: DC
  Administered 2019-05-04 – 2019-05-05 (×3): 125 mg via ORAL
  Filled 2019-05-04 (×4): qty 1

## 2019-05-04 MED ORDER — SODIUM CHLORIDE 0.9 % IV SOLN
INTRAVENOUS | Status: DC
  Administered 2019-05-04 – 2019-05-05 (×2): via INTRAVENOUS

## 2019-05-04 NOTE — ED Provider Notes (Signed)
[facility] DEPT Provider Note   CSN: [id number] Arrival date & time: 05/03/19  2350     History   Chief Complaint Chief Complaint  Patient presents with  . Shoulder Pain    HPI Katie Silva is a 85 y.o. female.     Patient was brought to the emergency department by ambulance from skilled nursing facility where she resides.  Patient complaining of pain in the left chest wall and left shoulder area.  Reviewing records reveals that she was seen in the ER 3 days ago with similar complaints, found to have a large bruise on the left chest wall.  Work-up was reassuring.  Patient denies any recent falls.  She is not having any headache.  She denies difficulty breathing, cough, infectious type symptoms.  No hip or leg pain.     Past Medical History:  Diagnosis Date  . Anemia   . Arthritis    hands, knees  . Breast cancer, left breast ([place]) 2003   s/p mastectomy, chemo.  Dr. Chancy Milroy oncologist  . Cerebrovascular accident (CVA) due to bilateral embolism of posterior cerebral arteries ([place])   . Cerebrovascular accident (CVA) due to embolism of left anterior cerebral artery ([place])   . Cerebrovascular accident (CVA) due to embolism of right middle cerebral artery ([place])   . Dementia ([place])   . Fracture of humerus, proximal, right, closed 07/2010   s/p hemiarthroplasty  . Gastric ulcer with hemorrhage 02/14/2013  . Helicobacter pylori (H. pylori) infection 02/15/2013   eradication confirmed 05/2013 UBT  . Hypertension   . Multiple duodenal ulcers 02/14/2013  . New onset atrial fibrillation ([place])   . Osteoporosis   . Stage 3 chronic kidney disease ([place])   . Stroke [facility]) 04/2018    Patient Active Problem List   Diagnosis Date Noted  . Chest wall hematoma, left, initial encounter 05/04/2019  . Hypertension   . Cerebrovascular accident (CVA) due to embolism of right middle cerebral artery ([place])   . Cerebrovascular accident (CVA) due to embolism of left  anterior cerebral artery ([place])   . Cerebrovascular accident (CVA) due to bilateral embolism of posterior cerebral arteries ([place])   . New onset atrial fibrillation ([place]) 05/02/2018  . Acute cardioembolic stroke ([place]) [id number]  . Atrial fibrillation with RVR ([place])   . Cerebrovascular accident (CVA) due to embolism of precerebral artery ([place])   . Diastolic dysfunction   . Benign essential HTN   . Stage 3 chronic kidney disease ([place])   . Stroke ([place]) 04/07/2018  . Urinary frequency 11/29/2017  . Encounter for completion of form with patient 05/31/2017  . Pedal edema 12/31/2015  . Dementia ([place]) 03/14/2015  . Postoperative anemia 04/14/2012  . SVT (supraventricular tachycardia) ([place]) 04/14/2012  . Closed left hip fracture ([place]) 04/13/2012  . HTN (hypertension) 01/30/2011  . Breast cancer, left breast ([place])   . Osteoporosis   . Fracture of humerus, proximal, right, closed 07/08/2010    Past Surgical History:  Procedure Laterality Date  . ABDOMINAL HYSTERECTOMY    . BREATH TEK H PYLORI N/A 06/06/2013   Procedure: BREATH TEK H PYLORI;  Surgeon: Gatha Mayer, MD;  Location[facility];  Service: Endoscopy;  Laterality: N/A;  . ESOPHAGOGASTRODUODENOSCOPY N/A 02/14/2013   Procedure: ESOPHAGOGASTRODUODENOSCOPY (EGD);  Surgeon: Gatha Mayer, MD;  Location: [facility];  Service: Endoscopy;  Laterality: N/A;  . FEMUR IM NAIL  04/13/2012   Procedure: INTRAMEDULLARY (IM) NAIL FEMORAL;  Surgeon: Melina Schools, MD;  Location: [facility]  ORS;  Service: Orthopedics;  Laterality: Left;  . HIP SURGERY    . KNEE ARTHROSCOPY Bilateral    BIlateral knees at [facility]  . MASTECTOMY Left   . SHOULDER HEMI-ARTHROPLASTY Right 07/2010   Dr. Mardelle Matte     OB History   No obstetric history on file.      Home Medications    Prior to Admission medications   Medication Sig Start Date End Date Taking? Authorizing Provider  acetaminophen (TYLENOL) 325 MG tablet Take 650 mg by mouth every 4 (four) hours as needed  for mild pain.   Yes [provider]  apixaban (ELIQUIS) 2.5 MG TABS tablet Take 2.5 mg by mouth 2 (two) times daily.   Yes [provider]  divalproex (DEPAKOTE) 125 MG DR tablet Take 125 mg by mouth 2 (two) times daily.   Yes [provider]  divalproex (DEPAKOTE) 125 MG DR tablet Take 125 mg by mouth every 12 (twelve) hours as needed (agitation).   Yes [provider]  lisinopril (PRINIVIL,ZESTRIL) 10 MG tablet Take 1 tablet (10 mg total) by mouth daily. 11/16/17  Yes Lucille Passy, MD  metoprolol tartrate (LOPRESSOR) 25 MG tablet Take 0.5 tablets (12.5 mg total) by mouth 2 (two) times daily. 05/07/18  Yes Regalado, Belkys A, MD  Misc Natural Products (OSTEO BI-FLEX JOINT SHIELD PO) Take 1 tablet by mouth daily.   Yes [provider]  Multiple Vitamin (MULTIVITAMIN WITH MINERALS) TABS tablet Take 1 tablet by mouth daily.   Yes [provider]  pravastatin (PRAVACHOL) 20 MG tablet Take 1 tablet (20 mg total) by mouth daily at 6 PM. 05/07/18  Yes Regalado, Belkys A, MD  apixaban (ELIQUIS) 5 MG TABS tablet Take 1 tablet (5 mg total) by mouth 2 (two) times daily. Patient not taking: Reported on 05/04/2019 05/07/18   Regalado, Jerald Kief A, MD  cephALEXin (KEFLEX) 500 MG capsule Take 1 capsule (500 mg total) by mouth 2 (two) times daily. Patient not taking: Reported on 05/04/2019 10/09/18   Little, Wenda Overland, MD  UNABLE TO FIND Apply topically 2 (two) times daily. Med Name: Skin Prep Spray to bilateral heels and left ankle    [provider]    Family History Family History  Problem Relation Age of Onset  . Myasthenia gravis Mother   . Coronary artery disease Father   . Colon cancer Sister   . GER disease Son     Social History Social History   Tobacco Use  . Smoking status: Never Smoker  . Smokeless tobacco: Never Used  Substance Use Topics  . Alcohol use: No  . Drug use: No     Allergies   Fexofenadine hcl, Aricept  [donepezil hcl], Ativan [lorazepam], Penicillins, and Tylenol [acetaminophen]   Review of Systems Review of Systems  Unable to perform ROS: Dementia     Physical Exam Updated Vital Signs BP 138/63   Pulse 73   Resp 16   SpO2 96%   Physical Exam Vitals signs and nursing note reviewed.  Constitutional:      General: She is not in acute distress.    Appearance: Normal appearance. She is well-developed.  HENT:     Head: Normocephalic and atraumatic.     Right Ear: Hearing normal.     Left Ear: Hearing normal.     Nose: Nose normal.  Eyes:     Conjunctiva/sclera: Conjunctivae normal.     Pupils: Pupils are equal, round, and reactive to light.  Neck:  Musculoskeletal: Normal range of motion and neck supple.  Cardiovascular:     Rate and Rhythm: Regular rhythm.     Heart sounds: S1 normal and S2 normal. No murmur. No friction rub. No gallop.   Pulmonary:     Effort: Pulmonary effort is normal. No respiratory distress.     Breath sounds: Normal breath sounds.  Chest:     Chest wall: No deformity or tenderness.    Abdominal:     General: Bowel sounds are normal.     Palpations: Abdomen is soft.     Tenderness: There is no abdominal tenderness. There is no guarding or rebound. Negative signs include Murphy's sign and McBurney's sign.     Hernia: No hernia is present.  Musculoskeletal: Normal range of motion.  Skin:    General: Skin is warm and dry.     Findings: Bruising present. No rash.       Neurological:     Mental Status: She is alert and oriented to person, place, and time.     GCS: GCS eye subscore is 4. GCS verbal subscore is 5. GCS motor subscore is 6.     Cranial Nerves: No cranial nerve deficit.     Sensory: No sensory deficit.     Coordination: Coordination normal.  Psychiatric:        Speech: Speech normal.        Behavior: Behavior normal.        Thought Content: Thought content normal.      ED Treatments / Results  Labs (all labs ordered are  listed, but only abnormal results are displayed) Labs Reviewed  BASIC METABOLIC PANEL - Abnormal; Notable for the following components:      Result Value   CO2 21 (*)    Glucose, Bld 145 (*)    BUN 29 (*)    Calcium 8.6 (*)    All other components within normal limits  CBC WITH DIFFERENTIAL/PLATELET - Abnormal; Notable for the following components:   WBC 12.1 (*)    Hemoglobin 11.3 (*)    RDW 16.1 (*)    Neutro Abs 8.7 (*)    Monocytes Absolute 1.2 (*)    Abs Immature Granulocytes 0.10 (*)    All other components within normal limits  SARS CORONAVIRUS 2 (HOSPITAL ORDER, [facility])    EKG None  Radiology Ct Chest W Contrast  Result Date: 05/04/2019 CLINICAL DATA:  Fall 05/01/2019 with left chest bruising. EXAM: CT CHEST WITH CONTRAST TECHNIQUE: Multidetector CT imaging of the chest was performed during intravenous contrast administration. CONTRAST:  20mL OMNIPAQUE IOHEXOL 300 MG/ML  SOLN COMPARISON:  Chest x-ray from 3 days ago FINDINGS: Cardiovascular: Cardiomegaly. There may be thinning at the left ventricular apex related to prior infarct. Mitral annular calcification that is bulky. Mildly dilated ascending aorta measuring 4 cm on coronal reformats. Mediastinum/Nodes: Negative for hematoma or pneumomediastinum. Lungs/Pleura: No hemothorax, pneumothorax, or lung contusion. Upper Abdomen: Extensive diverticulosis seen in the left upper quadrant. Right renal cystic density. Musculoskeletal: Large hematoma throughout the left pectoralis major, thickening the muscle to 5 cm. There is active extravasation into a low density/un-clotted portion of the cranial hematoma. Postoperative left axilla. Negative for fracture Critical Value/emergent results were called by telephone at the time of interpretation on 05/04/2019 at 6:16 am to Dr. Joseph Berkshire , who verbally acknowledged these results. IMPRESSION: 1. Large hematoma expanding the left pectoralis major with  active arterial hemorrhage. 2. No evidence of intrathoracic injury.  3. Thinned appearance at the left ventricular apex, question prior infarct. 4. Mildly aneurysmal ascending aorta 4 cm. If appropriate for comorbidities a one year follow-up CTA could be obtained. Electronically Signed   By: Monte Fantasia M.D.   On: 05/04/2019 06:21    Procedures Procedures (including critical care time)  Medications Ordered in ED Medications  sodium chloride (PF) 0.9 % injection (has no administration in time range)  iohexol (OMNIPAQUE) 300 MG/ML solution 75 mL (75 mLs Intravenous Contrast Given 05/04/19 0530)     Initial Impression / Assessment and Plan / ED Course  I have reviewed the triage vital signs and the nursing notes.  Pertinent labs & imaging results that were available during my care of the patient were reviewed by me and considered in my medical decision making (see chart for details).        85 year old female with complaints of left-sided chest pain.  Patient was seen several days ago with similar complaints.  At that time she was noted to have bruising to the left upper chest wall which is felt to be the source of the pain.  Bruising was once again visualized at arrival here in the ER.  Patient was complaining of pain in this area and it was very tender to the touch, clearly musculoskeletal in nature.  During the period of evaluation here, area was noted to develop a large hematoma that was expanding over time.  Patient underwent CT scan which showed active bleeding.  Discussed with Dr. Larena Glassman, on-call for interventional radiology.  He has reviewed the images and is does not feel that this is a vessel that would be reachable by IR to embolize.  Patient will therefore require observation in the hospital for monitoring because she is on Eliquis.  Hold Eliquis, ice and compress the area to minimize expansion of hematoma.  Final Clinical Impressions(s) / ED Diagnoses   Final diagnoses:   Chest wall hematoma, left, initial encounter    ED Discharge Orders    None       Orpah Greek, MD 05/04/19 [phone]

## 2019-05-04 NOTE — Progress Notes (Signed)
Called for report, this is for throughput purposes only, staff unavailable.

## 2019-05-04 NOTE — ED Notes (Signed)
Pt transported to CT ?

## 2019-05-04 NOTE — ED Notes (Signed)
Ice applied to L shoulder. Area appears to be larger than when pt first arrived, Pollina, MD was notified and at bedside.

## 2019-05-04 NOTE — ED Triage Notes (Signed)
Pt BIB [facility] from [facility], with c/o left shoulder pain. Pt does present with swelling and bruising to left shoulder and chest. Pt has hx of dementia, A&O x2.

## 2019-05-04 NOTE — ED Notes (Signed)
Report given to [facility], RN. Care transferred at this time.

## 2019-05-04 NOTE — H&P (Signed)
History and Physical    Katie Silva [id number] DOB: [birth year] DOA: 05/03/2019  PCP: Sande Brothers, MD  Patient coming from: SNF  I have personally briefly reviewed patient's old medical records in [place]  Chief Complaint: worsening chest wall pain.   HPI: Katie Silva is a 85 y.o. female with medical history significant of anemia, left sided presents cancer, s/p  Mastectomy, and chemotherapy, CVA, dementia, hypertension, atrial fibrillation, stage 3 CKD, was brought in to [facility] ED for worsening left chest wall pain and swelling. She was seen three days ago for similar complaints, where she was found to have some bruising over the left sided chest  without any history of fall. X rays were not significant and she was discharged . She presents today with worsening pain and swelling to the same area. Pt has dementia and is a poor historian. Most of the history was obtained from ED charts and the SNF paperwork.   She reports the pain is improving with ice and pain medications.  She denies any fever or chills.   ED Course: on arrival to ED, she was afebrile, normotensive, labs were significant for bicarb of 21, BUN of 29, hemoglobin of 11.3 and wbc count of 12.1.  Sars coronavirus 2 is negative CT chest with contrast shows Large hematoma expanding the left pectoralis major with active arterial hemorrhage without any evidence of intrathoracic injury.  IR Dr Reesa Chew consulted reviewed the images and does not feel the blood vessel can be reached to embolize.     She was referred to [facility] for overnight observation, ice and compression of the area to minimize the expansion of the hematoma, monitor the hemoglobin while  holding the eliquis.    Review of Systems: detailed ROS couldn't be obtained due to dementia.    Past Medical History:  Diagnosis Date   Anemia    Arthritis    hands, knees   Breast cancer, left breast ([place]) 2003   s/p mastectomy, chemo.  Dr. Chancy Milroy oncologist    Cerebrovascular accident (CVA) due to bilateral embolism of posterior cerebral arteries [facility])    Cerebrovascular accident (CVA) due to embolism of left anterior cerebral artery [facility])    Cerebrovascular accident (CVA) due to embolism of right middle cerebral artery ([place])    Dementia ([place])    Fracture of humerus, proximal, right, closed 07/2010   s/p hemiarthroplasty   Gastric ulcer with hemorrhage [id number]   Helicobacter pylori (H. pylori) infection 02/15/2013   eradication confirmed 05/2013 [facility]   Hypertension    Multiple duodenal ulcers 02/14/2013   New onset atrial fibrillation ([place])    Osteoporosis    Stage 3 chronic kidney disease ([place])    Stroke ([place]) 04/2018    Past Surgical History:  Procedure Laterality Date   ABDOMINAL HYSTERECTOMY     BREATH TEK H PYLORI N/A 06/06/2013   Procedure: BREATH TEK Kandis Ban;  Surgeon: Gatha Mayer, MD;  Location: [facility] ENDOSCOPY;  Service: Endoscopy;  Laterality: N/A;   ESOPHAGOGASTRODUODENOSCOPY N/A 02/14/2013   Procedure: ESOPHAGOGASTRODUODENOSCOPY (EGD);  Surgeon: Gatha Mayer, MD;  Location: [facility];  Service: Endoscopy;  Laterality: N/A;   FEMUR IM NAIL  04/13/2012   Procedure: INTRAMEDULLARY (IM) NAIL FEMORAL;  Surgeon: Melina Schools, MD;  Location: [facility] ORS;  Service: Orthopedics;  Laterality: Left;   HIP SURGERY     KNEE ARTHROSCOPY Bilateral    BIlateral knees at [place] Left    SHOULDER HEMI-ARTHROPLASTY Right 07/2010  Dr. Mardelle Matte     reports that she has never smoked. She has never used smokeless tobacco. She reports that she does not drink alcohol or use drugs.  Allergies  Allergen Reactions   Fexofenadine Hcl Other (See Comments)    Doesn't agree with patient   Aricept [Donepezil Hcl] Other (See Comments)    nightmares   Ativan [Lorazepam] Other (See Comments)    On MAR   Penicillins Other (See Comments)    Pt does not remember reaction to penicillin ("did not work")   Tylenol  [Acetaminophen] Other (See Comments)    "funny feeling in head."    Family History  Problem Relation Age of Onset   Myasthenia gravis Mother    Coronary artery disease Father    Colon cancer Sister    GER disease Son    Family history couldn't be reviewed due to dementia.   Prior to Admission medications   Medication Sig Start Date End Date Taking? Authorizing Provider  acetaminophen (TYLENOL) 325 MG tablet Take 650 mg by mouth every 4 (four) hours as needed for mild pain.   Yes [provider]  apixaban (ELIQUIS) 2.5 MG TABS tablet Take 2.5 mg by mouth 2 (two) times daily.   Yes [provider]  divalproex (DEPAKOTE) 125 MG DR tablet Take 125 mg by mouth 2 (two) times daily.   Yes [provider]  divalproex (DEPAKOTE) 125 MG DR tablet Take 125 mg by mouth every 12 (twelve) hours as needed (agitation).   Yes [provider]  lisinopril (PRINIVIL,ZESTRIL) 10 MG tablet Take 1 tablet (10 mg total) by mouth daily. 11/16/17  Yes Lucille Passy, MD  metoprolol tartrate (LOPRESSOR) 25 MG tablet Take 0.5 tablets (12.5 mg total) by mouth 2 (two) times daily. 05/07/18  Yes Regalado, Belkys A, MD  Misc Natural Products (OSTEO BI-FLEX JOINT SHIELD PO) Take 1 tablet by mouth daily.   Yes [provider]  Multiple Vitamin (MULTIVITAMIN WITH MINERALS) TABS tablet Take 1 tablet by mouth daily.   Yes [provider]  pravastatin (PRAVACHOL) 20 MG tablet Take 1 tablet (20 mg total) by mouth daily at 6 PM. 05/07/18  Yes Regalado, Belkys A, MD  apixaban (ELIQUIS) 5 MG TABS tablet Take 1 tablet (5 mg total) by mouth 2 (two) times daily. Patient not taking: Reported on 05/04/2019 05/07/18   Regalado, Jerald Kief A, MD  cephALEXin (KEFLEX) 500 MG capsule Take 1 capsule (500 mg total) by mouth 2 (two) times daily. Patient not taking: Reported on 05/04/2019 10/09/18   Little, Wenda Overland, MD  UNABLE TO FIND Apply topically 2 (two) times daily. Med Name: Skin Prep  Spray to bilateral heels and left ankle    [provider]    Physical Exam: Vitals:   05/04/19 0600 05/04/19 0730 05/04/19 0800 05/04/19 0936  BP: (!) 161/64 138/63 (!) 156/62 (!) 131/52  Pulse: 77 73 78 80  Resp: 18 16 16 17   Temp:    (!) 97.4 F (36.3 C)  TempSrc:    Oral  SpO2: 100% 96% 96% 97%  Weight:    61.3 kg    Constitutional: NAD, calm, comfortable Vitals:   05/04/19 0600 05/04/19 0730 05/04/19 0800 05/04/19 0936  BP: (!) 161/64 138/63 (!) 156/62 (!) 131/52  Pulse: 77 73 78 80  Resp: 18 16 16 17   Temp:    (!) 97.4 F (36.3 C)  TempSrc:    Oral  SpO2: 100% 96% 96% 97%  Weight:  61.3 kg   Eyes: PERRL, lids and conjunctivae normal ENMT: Mucous membranes are moist.  Neck: normal, supple, Respiratory: clear to auscultation bilaterally, no wheezing, no crackles.   Cardiovascular: Regular rate and rhythm, no murmurs No extremity edema. 2+ pedal pulses. No carotid bruits.  Abdomen: no tenderness, no masses palpated. No hepatosplenomegaly. Bowel sounds positive.  Musculoskeletal: no clubbing / cyanosis, no pedal edema.  Skin: no rashes, lesions, ulcers. No induration Neurologic: alert and answering some simple questions. Able to move her extremities.    Labs on Admission: I have personally reviewed following labs and imaging studies  CBC: Recent Labs  Lab 05/01/19 1127 05/04/19 0318  WBC 10.2 12.1*  NEUTROABS 4.8 8.7*  HGB 12.7 11.3*  HCT 41.2 36.7  MCV 89.0 89.5  PLT 228 [id number]   Basic Metabolic Panel: Recent Labs  Lab 05/01/19 1127 05/04/19 0318  NA 143 139  K 4.3 4.1  CL 107 106  CO2 27 21*  GLUCOSE 102* 145*  BUN 26* 29*  CREATININE 0.89 0.78  CALCIUM 9.0 8.6*   GFR: CrCl cannot be calculated (Unknown ideal weight.). Liver Function Tests: No results for input(s): AST, ALT, ALKPHOS, BILITOT, PROT, ALBUMIN in the last 168 hours. No results for input(s): LIPASE, AMYLASE in the last 168 hours. No results for input(s): AMMONIA in the  last 168 hours. Coagulation Profile: No results for input(s): INR, PROTIME in the last 168 hours. Cardiac Enzymes: No results for input(s): CKTOTAL, CKMB, CKMBINDEX, TROPONINI in the last 168 hours. BNP (last 3 results) No results for input(s): PROBNP in the last 8760 hours. HbA1C: No results for input(s): HGBA1C in the last 72 hours. CBG: No results for input(s): GLUCAP in the last 168 hours. Lipid Profile: No results for input(s): CHOL, HDL, LDLCALC, TRIG, CHOLHDL, LDLDIRECT in the last 72 hours. Thyroid Function Tests: No results for input(s): TSH, T4TOTAL, FREET4, T3FREE, THYROIDAB in the last 72 hours. Anemia Panel: No results for input(s): VITAMINB12, FOLATE, FERRITIN, TIBC, IRON, RETICCTPCT in the last 72 hours. Urine analysis:    Component Value Date/Time   COLORURINE YELLOW 10/09/2018 1948   APPEARANCEUR CLOUDY (A) 10/09/2018 1948   LABSPEC 1.013 10/09/2018 1948   PHURINE 5.0 10/09/2018 1948   GLUCOSEU NEGATIVE 10/09/2018 1948   HGBUR SMALL (A) 10/09/2018 1948   BILIRUBINUR NEGATIVE 10/09/2018 1948   BILIRUBINUR Negative 11/29/2017 1605   KETONESUR 20 (A) 10/09/2018 1948   PROTEINUR NEGATIVE 10/09/2018 1948   UROBILINOGEN 0.2 11/29/2017 1605   UROBILINOGEN 0.2 10/08/2009 1326   NITRITE POSITIVE (A) 10/09/2018 1948   LEUKOCYTESUR LARGE (A) 10/09/2018 1948    Radiological Exams on Admission: Ct Chest W Contrast  Result Date: 05/04/2019 CLINICAL DATA:  Fall 05/01/2019 with left chest bruising. EXAM: CT CHEST WITH CONTRAST TECHNIQUE: Multidetector CT imaging of the chest was performed during intravenous contrast administration. CONTRAST:  30mL OMNIPAQUE IOHEXOL 300 MG/ML  SOLN COMPARISON:  Chest x-ray from 3 days ago FINDINGS: Cardiovascular: Cardiomegaly. There may be thinning at the left ventricular apex related to prior infarct. Mitral annular calcification that is bulky. Mildly dilated ascending aorta measuring 4 cm on coronal reformats. Mediastinum/Nodes: Negative for  hematoma or pneumomediastinum. Lungs/Pleura: No hemothorax, pneumothorax, or lung contusion. Upper Abdomen: Extensive diverticulosis seen in the left upper quadrant. Right renal cystic density. Musculoskeletal: Large hematoma throughout the left pectoralis major, thickening the muscle to 5 cm. There is active extravasation into a low density/un-clotted portion of the cranial hematoma. Postoperative left axilla. Negative for fracture Critical Value/emergent results were called  by telephone at the time of interpretation on 05/04/2019 at 6:16 am to Dr. Joseph Berkshire , who verbally acknowledged these results. IMPRESSION: 1. Large hematoma expanding the left pectoralis major with active arterial hemorrhage. 2. No evidence of intrathoracic injury. 3. Thinned appearance at the left ventricular apex, question prior infarct. 4. Mildly aneurysmal ascending aorta 4 cm. If appropriate for comorbidities a one year follow-up CTA could be obtained. Electronically Signed   By: Monte Fantasia M.D.   On: 05/04/2019 06:21    EKG:not done.   Assessment/Plan Active Problems:   Chest wall hematoma, left, initial encounter   Left chest wall hematoma  Unknown if she had a fall and patient couldn't tell me any details due to dementia.  Fall precautions.  Place ICE and compression of the left chest wall area to minimize the hematoma expansion. Meanwhile we have stopped the eliquis and aspirin.  Pain control and check H&h tonight and in am.  Discussed with IR, Dr Reesa Chew who suggested the blood vessel I snot amenable to intervention.    Hypertension; Well controlled.  Resume home medications.    H/o embolic stroke:  Continue with statin. Holding the eliquis due to bleeding.    Dementia: no agitation seen, fall precautions.    Mild leukocytosis:  Rule out UTI:  Probably reactive.  Recheck cbc in am.    Paroxysmal atrial fibrillation:  Rate controlled with BB.  Anti coagulation on hold due to active  bleeding.     Aortic aneurysm:  Recommend outpatient follow up with  Annual CTA.   Severity of Illness: The appropriate patient status for this patient is OBSERVATION. Observation status is judged to be reasonable and necessary in order to provide the required intensity of service to ensure the patient's safety. The patient's presenting symptoms, physical exam findings, and initial radiographic and laboratory data in the context of their medical condition is felt to place them at decreased risk for further clinical deterioration. Furthermore, it is anticipated that the patient will be medically stable for discharge from the hospital within 2 midnights of admission. The following factors support the patient status of observation.   " The patient's presenting symptoms include pain  and swelling over the left sided chest wall. . " The physical exam findings include bruising and swelling of the left sided chest wall.  " The initial radiographic and laboratory data are Large hematoma expanding the left pectoralis major with active arterial hemorrhage.     DVT prophylaxis: scd's  Code Status: DNR  Family Communication: none at bedside.  Disposition Plan: pending stabilization of the hematoma.  Consults called: Dr Reesa Chew with IR.  Admission status: obs/ telemetry.    Hosie Poisson MD [name] Pager [phone]   If 7PM-7AM, please contact night-coverage [email or web address] Password [email or web address]  05/04/2019, 10:02 AM

## 2019-05-05 DIAGNOSIS — R4182 Altered mental status, unspecified: Secondary | ICD-10-CM | POA: Diagnosis not present

## 2019-05-05 DIAGNOSIS — S20212A Contusion of left front wall of thorax, initial encounter: Secondary | ICD-10-CM | POA: Diagnosis not present

## 2019-05-05 DIAGNOSIS — Z7401 Bed confinement status: Secondary | ICD-10-CM | POA: Diagnosis not present

## 2019-05-05 DIAGNOSIS — I10 Essential (primary) hypertension: Secondary | ICD-10-CM | POA: Diagnosis not present

## 2019-05-05 DIAGNOSIS — M2550 Pain in unspecified joint: Secondary | ICD-10-CM | POA: Diagnosis not present

## 2019-05-05 LAB — HEMOGLOBIN AND HEMATOCRIT, BLOOD
HCT: 30.6 % — ABNORMAL LOW (ref 36.0–46.0)
Hemoglobin: 9 g/dL — ABNORMAL LOW (ref 12.0–15.0)

## 2019-05-05 LAB — BASIC METABOLIC PANEL
Anion gap: 7 (ref 5–15)
BUN: 35 mg/dL — ABNORMAL HIGH (ref 8–23)
CO2: 23 mmol/L (ref 22–32)
Calcium: 8.2 mg/dL — ABNORMAL LOW (ref 8.9–10.3)
Chloride: 110 mmol/L (ref 98–111)
Creatinine, Ser: 1.01 mg/dL — ABNORMAL HIGH (ref 0.44–1.00)
GFR calc Af Amer: 56 mL/min — ABNORMAL LOW (ref >60)
GFR calc non Af Amer: 49 mL/min — ABNORMAL LOW (ref >60)
Glucose, Bld: 108 mg/dL — ABNORMAL HIGH (ref 70–99)
Potassium: 4.3 mmol/L (ref 3.5–5.1)
Sodium: 140 mmol/L (ref 135–145)

## 2019-05-05 LAB — CBC
HCT: 30.2 % — ABNORMAL LOW (ref 36.0–46.0)
Hemoglobin: 9.4 g/dL — ABNORMAL LOW (ref 12.0–15.0)
MCH: 28 pg (ref 26.0–34.0)
MCHC: 31.1 g/dL (ref 30.0–36.0)
MCV: 89.9 fL (ref 80.0–100.0)
Platelets: 206 10*3/uL (ref 150–400)
RBC: 3.36 MIL/uL — ABNORMAL LOW (ref 3.87–5.11)
RDW: 16.4 % — ABNORMAL HIGH (ref 11.5–15.5)
WBC: 13.1 10*3/uL — ABNORMAL HIGH (ref 4.0–10.5)
nRBC: 0 % (ref 0.0–0.2)

## 2019-05-05 LAB — TSH: TSH: 2.11 u[IU]/mL (ref 0.350–4.500)

## 2019-05-05 MED ORDER — FERROUS SULFATE 325 (65 FE) MG PO TABS: 325.0000 mg | ORAL_TABLET | Freq: Every day | ORAL | 0 refills | Status: DC

## 2019-05-05 MED ORDER — CEPHALEXIN 250 MG PO CAPS: 250.0000 mg | ORAL_CAPSULE | Freq: Four times a day (QID) | ORAL | 0 refills | Status: AC

## 2019-05-05 MED ORDER — DOCUSATE SODIUM 100 MG PO CAPS: 100.0000 mg | ORAL_CAPSULE | Freq: Two times a day (BID) | ORAL | 0 refills | Status: AC

## 2019-05-05 NOTE — Discharge Summary (Addendum)
Physician Discharge Summary  Katie Silva [id number] DOB: 1926-08-05 DOA: 05/03/2019  PCP: Sande Brothers, MD  Admit date: 05/03/2019 Discharge date: 05/05/2019  Time spent: 40 minutes  Recommendations for Outpatient Follow-up:  1. Follow up outpatient CBC/CMP on 8/29.  Follow CBC intermittently after this.  Started on iron. 2. Discontinue eliquis.  Avoid aspirin and NSAIDs.  Discussed with daughter risk/benefit of resumption of eliquis, at this point, risks outweighs benefit.  Plan to discontinue eliquis.  Can discuss further as outpatient if desired with PCP. 3. Continue compression of L chest with ace dressing.  Pain control with tylenol and ice as needed.  If patient were to develop worsening left sided chest pain, would return to ED. 4. Treat possible UTI with keflex.  Follow final urine cultures and adjust as needed.  5. Follow up ascending aortic aneurysm  Discharge Diagnoses:  Active Problems:   Chest wall hematoma, left, initial encounter   Discharge Condition: stable  Diet recommendation: heart healthy  Filed Weights   05/04/19 0936  Weight: 61.3 kg    History of present illness:  Katie Silva is a 85 y.o. female with medical history significant of anemia, left sided presents cancer, s/p  Mastectomy, and chemotherapy, CVA, dementia, hypertension, atrial fibrillation, stage 3 CKD, was brought in to [facility] ED for worsening left chest wall pain and swelling. She was seen three days ago for similar complaints, where she was found to have some bruising over the left sided chest  without any history of fall. X rays were not significant and she was discharged . She presents today with worsening pain and swelling to the same area. Pt has dementia and is a poor historian. Most of the history was obtained from ED charts and the SNF paperwork.   She reports the pain is improving with ice and pain medications.  She denies any fever or chills.   ED Course: on arrival to ED, she  was afebrile, normotensive, labs were significant for bicarb of 21, BUN of 29, hemoglobin of 11.3 and wbc count of 12.1.  Sars coronavirus 2 is negative CT chest with contrast shows Large hematoma expanding the left pectoralis major with active arterial hemorrhage without any evidence of intrathoracic injury.  IR Dr Reesa Chew consulted reviewed the images and does not feel the blood vessel can be reached to embolize.    She was referred to [facility] for overnight observation, ice and compression of the area to minimize the expansion of the hematoma, monitor the hemoglobin while  holding the eliquis.   She was admitted for L chest wall hematoma which occurred spontaneously while on eliquis.  It does not sound like there was any history of trauma based on my discussion with staff from facility and daughter.  Hb dropped initially, but was relatively stable at time of discharge.  Pt had improved pain as well.  Case was discussed with IR who did not think blood vessel was amenable to intervention.  She'd improved on hospital day 1.  I discussed with facility who were comfortable with her coming back.    Hospital Course:  Left chest wall hematoma  ? Spontaneous while on eliquis Discontinue eliquis.  Discussed with daughter, risks outweigh benefit at this point.  Discussed risks of stroke after stopping eliquis.  Will plan to discontinue eliquis.  Can discuss with PCP further if desired.   Ace wrap, Ice prn.  APAP prn. Pain is improved.  Hb stable today.  Hematoma soft.  Plan for discharge  today, 8/28.   Follow Hb as outpatient.    Hypertension; Well controlled.  Resume home medications.   H/o embolic stroke:  Continue with statin.  Eliquis has been discontinued.  Discussed risk of stroke, but at this time, risk of bleeding outweighs this.  Discussed with daughter.  Planning to stop this at this time.  Can discuss further with PCP outpatient.  Dementia: no agitation seen, fall precautions.  Facility  comfortable taking her back on my discussion with them today.  Mild leukocytosis:  UA concerning for UTI Start on keflex Follow urine cx  Paroxysmal atrial fibrillation:  Rate controlled with BB.  Anti coagulation discontinued  Aortic aneurysm:  Recommend outpatient follow up with  Annual CTA.   Procedures:  none  Consultations:  IR over phone  Discharge Exam: Vitals:   05/05/19 0541 05/05/19 1354  BP: 118/66 (!) 167/70  Pulse: 64 63  Resp: 16 20  Temp: 97.9 F (36.6 C) 97.6 F (36.4 C)  SpO2: 99% 97%   A&Ox1 Denies pain.  No left sided chest pain. No UTI symptoms.  General: No acute distress. Cardiovascular: Heart sounds show a regular rate, and rhythm.  L chest with large hematoma, soft, tender to palpation.  Lungs: Clear to auscultation bilaterally Abdomen: Soft, nontender, nondistended Neurological: Alert and oriented 1. Moves all extremities 4. Cranial nerves II through XII grossly intact. Skin: Warm and dry. No rashes or lesions. Extremities: No clubbing or cyanosis. No edema.   Discharge Instructions   Discharge Instructions    Call MD for:  difficulty breathing, headache or visual disturbances   Complete by: As directed    Call MD for:  extreme fatigue   Complete by: As directed    Call MD for:  hives   Complete by: As directed    Call MD for:  persistant dizziness or light-headedness   Complete by: As directed    Call MD for:  persistant nausea and vomiting   Complete by: As directed    Call MD for:  redness, tenderness, or signs of infection (pain, swelling, redness, odor or green/yellow discharge around incision site)   Complete by: As directed    Call MD for:  severe uncontrolled pain   Complete by: As directed    Call MD for:  temperature >100.4   Complete by: As directed    Diet - low sodium heart healthy   Complete by: As directed    Discharge instructions   Complete by: As directed    You were seen for a bleed to your left  chest.  You had a hematoma (collection of blood) affected the left chest muscle (pectoralis major) with bleeding seen on imaging.  Your blood counts have stabilized and your pain has improved which suggests that this is improving.  Please discontinue your eliquis.  I think the risks of resuming this in the setting of this spontaneous bleed, likely outweight the benefits.  Further discussions can be had with your PCP.  Your urine appeared to have evidence of a urinary tract infection.  You don't have clear symptoms, but your white blood cell count is up, so we'll discharge you with antibiotics.  Please follow up with your PCP to follow the final urine culture to ensure the antibiotics prescribed covered this infection.   Continue the ace wrap compression of the left chest hematoma.  You can use ice or tylenol as needed for pain.  If you have worsening pain, this could be a sign of recurrent  bleeding, so please return for evaluation if this occurs.  You will need a follow up CT scan of your chest in a year to follow up an aortic aneurysm.  Return for new, recurrent, or worsening symptoms.  Please ask your PCP to request records from this hospitalization so they know what was done and what the next steps will be.   Increase activity slowly   Complete by: As directed      Allergies as of 05/05/2019      Reactions   Fexofenadine Hcl Other (See Comments)   Doesn't agree with patient   Aricept [donepezil Hcl] Other (See Comments)   nightmares   Ativan [lorazepam] Other (See Comments)   On MAR   Penicillins Other (See Comments)   Pt does not remember reaction to penicillin ("did not work")   Tylenol [acetaminophen] Other (See Comments)   "funny feeling in head."      Medication List    STOP taking these medications   apixaban 5 MG Tabs tablet Commonly known as: ELIQUIS   Eliquis 2.5 MG Tabs tablet Generic drug: apixaban     TAKE these medications   acetaminophen 325 MG tablet Commonly  known as: TYLENOL Take 650 mg by mouth every 4 (four) hours as needed for mild pain.   cephALEXin 250 MG capsule Commonly known as: KEFLEX Take 1 capsule (250 mg total) by mouth 4 (four) times daily for 7 days. What changed:   medication strength  how much to take  when to take this   divalproex 125 MG DR tablet Commonly known as: DEPAKOTE Take 125 mg by mouth 2 (two) times daily.   divalproex 125 MG DR tablet Commonly known as: DEPAKOTE Take 125 mg by mouth every 12 (twelve) hours as needed (agitation).   docusate sodium 100 MG capsule Commonly known as: Colace Take 1 capsule (100 mg total) by mouth 2 (two) times daily.   ferrous sulfate 325 (65 FE) MG tablet Take 1 tablet (325 mg total) by mouth daily.   lisinopril 10 MG tablet Commonly known as: ZESTRIL Take 1 tablet (10 mg total) by mouth daily.   metoprolol tartrate 25 MG tablet Commonly known as: LOPRESSOR Take 0.5 tablets (12.5 mg total) by mouth 2 (two) times daily.   multivitamin with minerals Tabs tablet Take 1 tablet by mouth daily.   OSTEO BI-FLEX JOINT SHIELD PO Take 1 tablet by mouth daily.   pravastatin 20 MG tablet Commonly known as: PRAVACHOL Take 1 tablet (20 mg total) by mouth daily at 6 PM.   UNABLE TO FIND Apply topically 2 (two) times daily. Med Name: Skin Prep Spray to bilateral heels and left ankle      Allergies  Allergen Reactions  . Fexofenadine Hcl Other (See Comments)    Doesn't agree with patient  . Aricept [Donepezil Hcl] Other (See Comments)    nightmares  . Ativan [Lorazepam] Other (See Comments)    On MAR  . Penicillins Other (See Comments)    Pt does not remember reaction to penicillin ("did not work")  . Tylenol [Acetaminophen] Other (See Comments)    "funny feeling in head."   Contact information for after-discharge care    Destination    [facility] .   Service: Assisted Living Contact information: [address] [place]  [place] [phone]               The results of significant diagnostics from this hospitalization (including imaging, microbiology, ancillary and laboratory)  are listed below for reference.    Significant Diagnostic Studies: Dg Ribs Unilateral W/chest Left  Result Date: 05/01/2019 CLINICAL DATA:  Upper chest bruising. Evaluation for left rib fracture. EXAM: LEFT RIBS AND CHEST - 3+ VIEW COMPARISON:  Chest radiographs 10/09/2018 FINDINGS: The patient is rotated to the right on the AP chest radiograph with grossly unchanged cardiomediastinal silhouette. Mitral annular calcification is noted. The lungs are mildly hypoinflated without airspace consolidation, edema, sizable pleural effusion, or pneumothorax identified. Left axillary surgical clips and a right shoulder arthroplasty are noted. No displaced rib fracture is identified. IMPRESSION: No displaced rib fracture identified. Electronically Signed   By: Logan Bores M.D.   On: 05/01/2019 12:19   Ct Chest W Contrast  Result Date: 05/04/2019 CLINICAL DATA:  Fall 05/01/2019 with left chest bruising. EXAM: CT CHEST WITH CONTRAST TECHNIQUE: Multidetector CT imaging of the chest was performed during intravenous contrast administration. CONTRAST:  50mL OMNIPAQUE IOHEXOL 300 MG/ML  SOLN COMPARISON:  Chest x-ray from 3 days ago FINDINGS: Cardiovascular: Cardiomegaly. There may be thinning at the left ventricular apex related to prior infarct. Mitral annular calcification that is bulky. Mildly dilated ascending aorta measuring 4 cm on coronal reformats. Mediastinum/Nodes: Negative for hematoma or pneumomediastinum. Lungs/Pleura: No hemothorax, pneumothorax, or lung contusion. Upper Abdomen: Extensive diverticulosis seen in the left upper quadrant. Right renal cystic density. Musculoskeletal: Large hematoma throughout the left pectoralis major, thickening the muscle to 5 cm. There is active extravasation into a low density/un-clotted portion of the cranial  hematoma. Postoperative left axilla. Negative for fracture Critical Value/emergent results were called by telephone at the time of interpretation on 05/04/2019 at 6:16 am to Dr. Joseph Berkshire , who verbally acknowledged these results. IMPRESSION: 1. Large hematoma expanding the left pectoralis major with active arterial hemorrhage. 2. No evidence of intrathoracic injury. 3. Thinned appearance at the left ventricular apex, question prior infarct. 4. Mildly aneurysmal ascending aorta 4 cm. If appropriate for comorbidities a one year follow-up CTA could be obtained. Electronically Signed   By: Monte Fantasia M.D.   On: 05/04/2019 06:21    Microbiology: Recent Results (from the past 240 hour(s))  SARS Coronavirus 2 [facility] order, Performed in [facility] hospital lab) Nasopharyngeal Nasopharyngeal Swab     Status: None   Collection Time: 05/04/19  7:36 AM   Specimen: Nasopharyngeal Swab  Result Value Ref Range Status   SARS Coronavirus 2 NEGATIVE NEGATIVE Final    Comment: (NOTE) If result is NEGATIVE SARS-CoV-2 target nucleic acids are NOT DETECTED. The SARS-CoV-2 RNA is generally detectable in upper and lower  respiratory specimens during the acute phase of infection. The lowest  concentration of SARS-CoV-2 viral copies this assay can detect is 250  copies / mL. A negative result does not preclude SARS-CoV-2 infection  and should not be used as the sole basis for treatment or other  patient management decisions.  A negative result may occur with  improper specimen collection / handling, submission of specimen other  than nasopharyngeal swab, presence of viral mutation(s) within the  areas targeted by this assay, and inadequate number of viral copies  (<250 copies / mL). A negative result must be combined with clinical  observations, patient history, and epidemiological information. If result is POSITIVE SARS-CoV-2 target nucleic acids are DETECTED. The SARS-CoV-2 RNA is generally  detectable in upper and lower  respiratory specimens dur ing the acute phase of infection.  Positive  results are indicative of active infection with SARS-CoV-2.  Clinical  correlation with  patient history and other diagnostic information is  necessary to determine patient infection status.  Positive results do  not rule out bacterial infection or co-infection with other viruses. If result is PRESUMPTIVE POSTIVE SARS-CoV-2 nucleic acids MAY BE PRESENT.   A presumptive positive result was obtained on the submitted specimen  and confirmed on repeat testing.  While 2019 novel coronavirus  (SARS-CoV-2) nucleic acids may be present in the submitted sample  additional confirmatory testing may be necessary for epidemiological  and / or clinical management purposes  to differentiate between  SARS-CoV-2 and other Sarbecovirus currently known to infect humans.  If clinically indicated additional testing with an alternate test  methodology [phone]) is advised. The SARS-CoV-2 RNA is generally  detectable in upper and lower respiratory sp ecimens during the acute  phase of infection. The expected result is Negative. Fact Sheet for Patients:  StrictlyIdeas.no Fact Sheet for Healthcare Providers: BankingDealers.co.za This test is not yet approved or cleared by the [place] FDA and has been authorized for detection and/or diagnosis of SARS-CoV-2 by FDA under an Emergency Use Authorization (EUA).  This EUA will remain in effect (meaning this test can be used) for the duration of the COVID-19 declaration under Section 564(b)(1) of the Act, 21 U.S.C. section 360bbb-3(b)(1), unless the authorization is terminated or revoked sooner. Performed at [facility], [place] [address] [place], [place] [address]      Labs: Basic Metabolic Panel: Recent Labs  Lab 05/01/19 1127 05/04/19 0318 05/05/19 0514  NA 143 139 140  K 4.3 4.1 4.3   CL 107 106 110  CO2 27 21* 23  GLUCOSE 102* 145* 108*  BUN 26* 29* 35*  CREATININE 0.89 0.78 1.01*  CALCIUM 9.0 8.6* 8.2*   Liver Function Tests: No results for input(s): AST, ALT, ALKPHOS, BILITOT, PROT, ALBUMIN in the last 168 hours. No results for input(s): LIPASE, AMYLASE in the last 168 hours. No results for input(s): AMMONIA in the last 168 hours. CBC: Recent Labs  Lab 05/01/19 1127 05/04/19 0318 05/04/19 1946 05/05/19 0514 05/05/19 1333  WBC 10.2 12.1*  --  13.1*  --   NEUTROABS 4.8 8.7*  --   --   --   HGB 12.7 11.3* 9.5* 9.4* 9.0*  HCT 41.2 36.7 30.5* 30.2* 30.6*  MCV 89.0 89.5  --  89.9  --   PLT 228 232  --  206  --    Cardiac Enzymes: Recent Labs  Lab 05/04/19 1946  CKTOTAL 121   BNP: BNP (last 3 results) No results for input(s): BNP in the last 8760 hours.  ProBNP (last 3 results) No results for input(s): PROBNP in the last 8760 hours.  CBG: No results for input(s): GLUCAP in the last 168 hours.     Signed:  Fayrene Helper MD.  [name] 05/05/2019, 2:53 PM

## 2019-05-05 NOTE — TOC Initial Note (Addendum)
Spoke with Minette Brine at [facility], provided DC information and arranged [facility] transport for pt.  Scripts in DC packet. No report call needed per facility.   Transition of Care [facility]) - Initial/Assessment Note    Patient Details  Name: Katie Silva MRN: [id number] Date of Birth: 06-18-1926  [facility] of [facility]) CM/SW Contact:    Nila Nephew, LCSW Phone Number: [phone] 05/05/2019, 1:01 PM  Clinical Narrative:    Pt admitted with Chest wall hematoma from [facility] care. She has resided there since 06/2018 per daughter Katie Silva. Katie Silva reports pt uses wheelchair there independently, needs assistance transferring and with ADLs, and eats independently. Is oriented to place and generally knows caregivers and can converse relevantly per daughter, although reports her memory and orientation to situation is usually fluctuating.  Daughter would like to be kept updated re: medical treatment plan. TOC team will follow to assist with disposition- anticipate returning to [facility] [place]. Will contact facility to ensure they are aware of pt's admission (currently observation).  (Pt's daughter also discussed some with CSW re: trying to make financial plans in case long term SNF care is eventually needed- states pt has been told she does not qualify for facility medicaid due to funds in bank account and her social security income. Is paying for memory care out of pocket but reports she would not be able to pay for SNF care. CSW encouraged daughter to access social/case management resources at ALF to see what assistance with navigating long term care can be provided. Daughter also reports she has met with Medicaid/social security in the past and will do this again when needed.)             Expected Discharge Plan: Assisted Living Barriers to Discharge: Continued Medical Work up   Patient Goals and CMS Choice Patient states their goals for this hospitalization and ongoing  recovery are:: pt did not participate      Expected Discharge Plan and Services Expected Discharge Plan: Assisted Living In-house Referral: Clinical Social Work     Living arrangements for the past 2 months: Assisted Living Facility(memory care) Expected Discharge Date: (unknown)                                    Prior Living Arrangements/Services Living arrangements for the past 2 months: Assisted Living Facility(memory care) Lives with:: Facility Resident Patient language and need for interpreter reviewed:: No Do you feel safe going back to the place where you live?: Yes      Need for Family Participation in Patient Care: Yes (Comment)(daughters and son) Care giver support system in place?: Yes (comment)(ALF and children) Current home services: DME Criminal Activity/Legal Involvement Pertinent to Current Situation/Hospitalization: No - Comment as needed  Activities of Daily Living Home Assistive Devices/Equipment: Grab bars in shower, Hand-held shower hose, Grab bars around toilet, Hospital bed, Blood pressure cuff, Scales, Wheelchair([facility] has necessary equipment for their residents) ADL Screening (condition at time of admission) Patient's cognitive ability adequate to safely complete daily activities?: No Is the patient deaf or have difficulty hearing?: No Does the patient have difficulty seeing, even when wearing glasses/contacts?: No Does the patient have difficulty concentrating, remembering, or making decisions?: Yes Patient able to express need for assistance with ADLs?: No Does the patient have difficulty dressing or bathing?: Yes Independently performs ADLs?: No Communication: Independent Dressing (OT): Needs assistance Is this a  change from baseline?: Pre-admission baseline Grooming: Needs assistance Is this a change from baseline?: Pre-admission baseline Feeding: Needs assistance Is this a change from baseline?: Pre-admission baseline Bathing: Needs  assistance Is this a change from baseline?: Pre-admission baseline Toileting: Needs assistance Is this a change from baseline?: Pre-admission baseline In/Out Bed: Needs assistance Is this a change from baseline?: Pre-admission baseline Walks in Home: Needs assistance Is this a change from baseline?: Pre-admission baseline Does the patient have difficulty walking or climbing stairs?: Yes Weakness of Legs: Both Weakness of Arms/Hands: Both  Permission Sought/Granted Permission sought to share information with : Family Supports, Customer service manager Permission granted to share information with : Yes, Verbal Permission Granted  Share Information with NAME: [name] [phone] daughter  Permission granted to share info w AGENCY: [facility] memory care ALF        Emotional Assessment              Admission diagnosis:  Chest wall hematoma, left, initial encounter [S20.212A] Patient Active Problem List   Diagnosis Date Noted  . Chest wall hematoma, left, initial encounter 05/04/2019  . Hypertension   . Cerebrovascular accident (CVA) due to embolism of right middle cerebral artery ([place])   . Cerebrovascular accident (CVA) due to embolism of left anterior cerebral artery ([place])   . Cerebrovascular accident (CVA) due to bilateral embolism of posterior cerebral arteries ([place])   . New onset atrial fibrillation ([place]) 05/02/2018  . Acute cardioembolic stroke ([place]) 26/37/8588  . Atrial fibrillation with RVR ([place])   . Cerebrovascular accident (CVA) due to embolism of precerebral artery ([place])   . Diastolic dysfunction   . Benign essential HTN   . Stage 3 chronic kidney disease ([place])   . Stroke ([place]) 04/07/2018  . Urinary frequency 11/29/2017  . Encounter for completion of form with patient 05/31/2017  . Pedal edema 12/31/2015  . Dementia ([place]) 03/14/2015  . Postoperative anemia 04/14/2012  . SVT (supraventricular tachycardia) ([place]) 04/14/2012  . Closed left hip  fracture ([place]) 04/13/2012  . HTN (hypertension) 01/30/2011  . Breast cancer, left breast ([place])   . Osteoporosis   . Fracture of humerus, proximal, right, closed 07/08/2010   PCP:  Sande Brothers, MD Pharmacy:  No Pharmacies Listed    Social Determinants of Health (SDOH) Interventions    Readmission Risk Interventions No flowsheet data found.

## 2019-05-05 NOTE — Care Management Obs Status (Signed)
[place] NOTIFICATION   Patient Details  Name: Katie Silva MRN: [id number] Date of Birth: [birth year]   Medicare Observation Status Notification Given:  Yes    Nila Nephew, LCSW 05/05/2019, 12:55 PM

## 2019-05-10 DIAGNOSIS — N390 Urinary tract infection, site not specified: Secondary | ICD-10-CM | POA: Diagnosis not present

## 2019-05-10 DIAGNOSIS — F0391 Unspecified dementia with behavioral disturbance: Secondary | ICD-10-CM | POA: Diagnosis not present

## 2019-05-10 DIAGNOSIS — E039 Hypothyroidism, unspecified: Secondary | ICD-10-CM | POA: Diagnosis not present

## 2019-05-10 DIAGNOSIS — N183 Chronic kidney disease, stage 3 (moderate): Secondary | ICD-10-CM | POA: Diagnosis not present

## 2019-05-10 DIAGNOSIS — I10 Essential (primary) hypertension: Secondary | ICD-10-CM | POA: Diagnosis not present

## 2019-05-10 DIAGNOSIS — E785 Hyperlipidemia, unspecified: Secondary | ICD-10-CM | POA: Diagnosis not present

## 2019-05-10 DIAGNOSIS — S20219A Contusion of unspecified front wall of thorax, initial encounter: Secondary | ICD-10-CM | POA: Diagnosis not present

## 2019-05-18 DIAGNOSIS — N183 Chronic kidney disease, stage 3 (moderate): Secondary | ICD-10-CM | POA: Diagnosis not present

## 2019-05-18 DIAGNOSIS — I10 Essential (primary) hypertension: Secondary | ICD-10-CM | POA: Diagnosis not present

## 2019-05-24 DIAGNOSIS — N183 Chronic kidney disease, stage 3 (moderate): Secondary | ICD-10-CM | POA: Diagnosis not present

## 2019-05-24 DIAGNOSIS — I10 Essential (primary) hypertension: Secondary | ICD-10-CM | POA: Diagnosis not present

## 2019-05-24 DIAGNOSIS — E785 Hyperlipidemia, unspecified: Secondary | ICD-10-CM | POA: Diagnosis not present

## 2019-05-24 DIAGNOSIS — E039 Hypothyroidism, unspecified: Secondary | ICD-10-CM | POA: Diagnosis not present

## 2019-05-24 DIAGNOSIS — F0391 Unspecified dementia with behavioral disturbance: Secondary | ICD-10-CM | POA: Diagnosis not present

## 2019-06-13 DIAGNOSIS — R404 Transient alteration of awareness: Secondary | ICD-10-CM | POA: Diagnosis not present

## 2019-06-13 DIAGNOSIS — W19XXXA Unspecified fall, initial encounter: Secondary | ICD-10-CM | POA: Diagnosis not present

## 2019-06-13 DIAGNOSIS — I10 Essential (primary) hypertension: Secondary | ICD-10-CM | POA: Diagnosis not present

## 2019-06-13 DIAGNOSIS — R61 Generalized hyperhidrosis: Secondary | ICD-10-CM | POA: Diagnosis not present

## 2019-06-13 DIAGNOSIS — J80 Acute respiratory distress syndrome: Secondary | ICD-10-CM | POA: Diagnosis not present

## 2019-06-14 ENCOUNTER — Emergency Department (HOSPITAL_COMMUNITY): Payer: Medicare PPO

## 2019-06-14 ENCOUNTER — Emergency Department (HOSPITAL_COMMUNITY)
Admission: EM | Admit: 2019-06-14 | Discharge: 2019-06-14 | Disposition: A | Discharge status: Home / Self Care | Payer: Medicare PPO | Attending: Emergency Medicine | Admitting: Emergency Medicine

## 2019-06-14 ENCOUNTER — Other Ambulatory Visit: Payer: Self-pay

## 2019-06-14 DIAGNOSIS — M2550 Pain in unspecified joint: Secondary | ICD-10-CM | POA: Diagnosis not present

## 2019-06-14 DIAGNOSIS — S0181XA Laceration without foreign body of other part of head, initial encounter: Secondary | ICD-10-CM | POA: Diagnosis not present

## 2019-06-14 DIAGNOSIS — Z853 Personal history of malignant neoplasm of breast: Secondary | ICD-10-CM | POA: Insufficient documentation

## 2019-06-14 DIAGNOSIS — Z8673 Personal history of transient ischemic attack (TIA), and cerebral infarction without residual deficits: Secondary | ICD-10-CM | POA: Diagnosis not present

## 2019-06-14 DIAGNOSIS — Y998 Other external cause status: Secondary | ICD-10-CM | POA: Diagnosis not present

## 2019-06-14 DIAGNOSIS — Z7401 Bed confinement status: Secondary | ICD-10-CM | POA: Diagnosis not present

## 2019-06-14 DIAGNOSIS — I129 Hypertensive chronic kidney disease with stage 1 through stage 4 chronic kidney disease, or unspecified chronic kidney disease: Secondary | ICD-10-CM | POA: Insufficient documentation

## 2019-06-14 DIAGNOSIS — S51012A Laceration without foreign body of left elbow, initial encounter: Secondary | ICD-10-CM

## 2019-06-14 DIAGNOSIS — I10 Essential (primary) hypertension: Secondary | ICD-10-CM | POA: Diagnosis not present

## 2019-06-14 DIAGNOSIS — Y939 Activity, unspecified: Secondary | ICD-10-CM | POA: Insufficient documentation

## 2019-06-14 DIAGNOSIS — Y92129 Unspecified place in nursing home as the place of occurrence of the external cause: Secondary | ICD-10-CM | POA: Diagnosis not present

## 2019-06-14 DIAGNOSIS — N183 Chronic kidney disease, stage 3 unspecified: Secondary | ICD-10-CM | POA: Insufficient documentation

## 2019-06-14 DIAGNOSIS — S0083XA Contusion of other part of head, initial encounter: Secondary | ICD-10-CM | POA: Diagnosis not present

## 2019-06-14 DIAGNOSIS — F0391 Unspecified dementia with behavioral disturbance: Secondary | ICD-10-CM | POA: Diagnosis not present

## 2019-06-14 DIAGNOSIS — S0990XA Unspecified injury of head, initial encounter: Secondary | ICD-10-CM | POA: Diagnosis not present

## 2019-06-14 DIAGNOSIS — W19XXXA Unspecified fall, initial encounter: Secondary | ICD-10-CM | POA: Diagnosis not present

## 2019-06-14 DIAGNOSIS — F0390 Unspecified dementia without behavioral disturbance: Secondary | ICD-10-CM | POA: Diagnosis not present

## 2019-06-14 DIAGNOSIS — S199XXA Unspecified injury of neck, initial encounter: Secondary | ICD-10-CM | POA: Diagnosis not present

## 2019-06-14 DIAGNOSIS — I959 Hypotension, unspecified: Secondary | ICD-10-CM | POA: Diagnosis not present

## 2019-06-14 DIAGNOSIS — Z79899 Other long term (current) drug therapy: Secondary | ICD-10-CM | POA: Diagnosis not present

## 2019-06-14 NOTE — ED Notes (Signed)
Pt back from radiology 

## 2019-06-14 NOTE — ED Triage Notes (Signed)
Per EMS: Pt is coming from [facility] with a c/o fall. Pt's fall was unwitnessed but she was alert when EMS arrived. Pt has a hematoma above left eyebrow and skin tears on her left arm. Pt also has hx of dementia.  VITALS:  BP 167/75 HR 70 SPO2 96% cbg 155 Temp 97.9

## 2019-06-14 NOTE — ED Provider Notes (Signed)
[facility] DEPT Provider Note  CSN: [id number] Arrival date & time: 06/14/19 0006  Chief Complaint(s) Fall ED Triage Notes Wynn Banker, RN (Registered Nurse)   Emergency Medicine   06/14/2019 12:09 AM   Signed   Per EMS: Pt is coming from [facility] with a c/o fall. Pt's fall was unwitnessed but she was alert when EMS arrived. Pt has a hematoma above left eyebrow and skin tears on her left arm. Pt also has hx of dementia.  VITALS:  BP 167/75 HR 70 SPO2 96% cbg 155 Temp 97.9       HPI Katie Silva is a 85 y.o. female here for unwitnessed fall at sNF.   Remainder of history, ROS, and physical exam limited due to patient's condition (dementia). Additional information was obtained from EMS.   Level V Caveat.  Attempt to call daughter unsuccessful.  HPI  Past Medical History Past Medical History:  Diagnosis Date   Anemia    Arthritis    hands, knees   Breast cancer, left breast ([place]) 2003   s/p mastectomy, chemo.  Dr. Chancy Milroy oncologist   Cerebrovascular accident (CVA) due to bilateral embolism of posterior cerebral arteries [facility])    Cerebrovascular accident (CVA) due to embolism of left anterior cerebral artery [facility])    Cerebrovascular accident (CVA) due to embolism of right middle cerebral artery ([place])    Dementia ([place])    Fracture of humerus, proximal, right, closed 07/2010   s/p hemiarthroplasty   Gastric ulcer with hemorrhage [id number]   Helicobacter pylori (H. pylori) infection 02/15/2013   eradication confirmed 05/2013 [facility]   Hypertension    Multiple duodenal ulcers 02/14/2013   New onset atrial fibrillation ([place])    Osteoporosis    Stage 3 chronic kidney disease ([place])    Stroke ([place]) 04/2018   Patient Active Problem List   Diagnosis Date Noted   Chest wall hematoma, left, initial encounter 05/04/2019   Hypertension    Cerebrovascular accident (CVA) due to embolism of right middle cerebral  artery ([place])    Cerebrovascular accident (CVA) due to embolism of left anterior cerebral artery ([place])    Cerebrovascular accident (CVA) due to bilateral embolism of posterior cerebral arteries ([place])    New onset atrial fibrillation ([place]) 05/02/2018   Acute cardioembolic stroke ([place]) [id number]   Atrial fibrillation with RVR ([place])    Cerebrovascular accident (CVA) due to embolism of precerebral artery (H[facility])    Diastolic dysfunction    Benign essential HTN    Stage 3 chronic kidney disease    Stroke ([place]) 04/07/2018   Urinary frequency 11/29/2017   Encounter for completion of form with patient 05/31/2017   Pedal edema 12/31/2015   Dementia ([place]) 03/14/2015   Postoperative anemia 04/14/2012   SVT (supraventricular tachycardia) ([place]) 04/14/2012   Closed left hip fracture ([place]) 04/13/2012   HTN (hypertension) 01/30/2011   Breast cancer, left breast ([place])    Osteoporosis    Fracture of humerus, proximal, right, closed 07/08/2010   Home Medication(s) Prior to Admission medications   Medication Sig Start Date End Date Taking? Authorizing Provider  acetaminophen (TYLENOL) 325 MG tablet Take 650 mg by mouth every 4 (four) hours as needed for mild pain.   Yes [provider]  divalproex (DEPAKOTE) 125 MG DR tablet Take 125 mg by mouth 2 (two) times daily.   Yes [provider]  divalproex (DEPAKOTE) 125 MG DR tablet Take 125 mg by mouth every 12 (twelve) hours as needed (agitation).  Yes [provider]  docusate sodium (COLACE) 100 MG capsule Take 100 mg by mouth 2 (two) times daily.   Yes [provider]  ferrous sulfate 325 (65 FE) MG tablet Take 1 tablet (325 mg total) by mouth daily. 05/05/19 06/14/19 Yes Elodia Florence., MD  levothyroxine (SYNTHROID) 25 MCG tablet Take 25 mcg by mouth daily before breakfast.   Yes [provider]  lisinopril (ZESTRIL) 20 MG tablet Take 20 mg by mouth daily.   Yes [provider]  metoprolol tartrate (LOPRESSOR) 25 MG tablet Take 0.5 tablets (12.5 mg total) by mouth 2 (two) times daily. 05/07/18  Yes Regalado, Belkys A, MD  Misc Natural Products (OSTEO BI-FLEX JOINT SHIELD PO) Take 1 tablet by mouth daily.   Yes [provider]  Multiple Vitamin (MULTIVITAMIN WITH MINERALS) TABS tablet Take 1 tablet by mouth daily.   Yes [provider]  pravastatin (PRAVACHOL) 20 MG tablet Take 1 tablet (20 mg total) by mouth daily at 6 PM. 05/07/18  Yes Regalado, Belkys A, MD  lisinopril (PRINIVIL,ZESTRIL) 10 MG tablet Take 1 tablet (10 mg total) by mouth daily. Patient not taking: Reported on 06/14/2019 11/16/17   Lucille Passy, MD                                                                                                                                    Past Surgical History Past Surgical History:  Procedure Laterality Date   ABDOMINAL HYSTERECTOMY     BREATH TEK H PYLORI N/A 06/06/2013   Procedure: BREATH TEK Kandis Ban;  Surgeon: Gatha Mayer, MD;  Location: [facility] ENDOSCOPY;  Service: Endoscopy;  Laterality: N/A;   ESOPHAGOGASTRODUODENOSCOPY N/A 02/14/2013   Procedure: ESOPHAGOGASTRODUODENOSCOPY (EGD);  Surgeon: Gatha Mayer, MD;  Location: [facility];  Service: Endoscopy;  Laterality: N/A;   FEMUR IM NAIL  04/13/2012   Procedure: INTRAMEDULLARY (IM) NAIL FEMORAL;  Surgeon: Melina Schools, MD;  Location: [facility] ORS;  Service: Orthopedics;  Laterality: Left;   HIP SURGERY     KNEE ARTHROSCOPY Bilateral    BIlateral knees at [place] Left    SHOULDER HEMI-ARTHROPLASTY Right 07/2010   Dr. Mardelle Matte   Family History Family History  Problem Relation Age of Onset   Myasthenia gravis Mother    Coronary artery disease Father    Colon cancer Sister    GER disease Son     Social History Social History   Tobacco Use   Smoking status: Never Smoker   Smokeless tobacco: Never Used  Substance Use Topics   Alcohol use: No   Drug use: No    Allergies Fexofenadine hcl, Aricept [donepezil hcl], Ativan [lorazepam], Penicillins, and Tylenol [acetaminophen]  Review of Systems Review of Systems  Unable to perform ROS: Dementia    Physical Exam Vital Signs  I have reviewed the triage vital signs BP (!) 172/65 (BP Location: Left Arm)  Pulse 77    Resp 16    SpO2 99%   Physical Exam Constitutional:      General: She is not in acute distress.    Appearance: She is well-developed. She is not diaphoretic.  HENT:     Head: Normocephalic. Contusion present.      Right Ear: External ear normal.     Left Ear: External ear normal.     Nose: Nose normal.  Eyes:     General: No scleral icterus.       Right eye: No discharge.        Left eye: No discharge.     Conjunctiva/sclera: Conjunctivae normal.     Pupils: Pupils are equal, round, and reactive to light.  Neck:     Musculoskeletal: Normal range of motion and neck supple.  Cardiovascular:     Rate and Rhythm: Normal rate and regular rhythm.     Pulses:          Radial pulses are 2+ on the right side and 2+ on the left side.       Dorsalis pedis pulses are 2+ on the right side and 2+ on the left side.     Heart sounds: Normal heart sounds. No murmur. No friction rub. No gallop.   Pulmonary:     Effort: Pulmonary effort is normal. No respiratory distress.     Breath sounds: Normal breath sounds. No stridor. No wheezing.  Abdominal:     General: There is no distension.     Palpations: Abdomen is soft.     Tenderness: There is no abdominal tenderness.  Musculoskeletal:        General: No tenderness.     Left elbow: No tenderness found.     Cervical back: She exhibits no bony tenderness.     Thoracic back: She exhibits no bony tenderness.     Lumbar back: She exhibits no bony tenderness.       Arms:     Comments: Clavicles stable. Chest stable to AP/Lat compression. Pelvis stable to Lat compression. No obvious extremity deformity. No chest or abdominal wall  contusion.  Skin:    General: Skin is warm and dry.     Findings: No erythema or rash.  Neurological:     Mental Status: She is alert and oriented to person, place, and time.     Comments: Moving all extremities     ED Results and Treatments Labs (all labs ordered are listed, but only abnormal results are displayed) Labs Reviewed - No data to display                                                                                                                       EKG  EKG Interpretation  Date/Time:    Ventricular Rate:    PR Interval:    QRS Duration:   QT Interval:    QTC Calculation:   R Axis:     Text Interpretation:  Radiology Ct Head Wo Contrast  Result Date: 06/14/2019 CLINICAL DATA:  Unwitnessed fall at nursing home EXAM: CT HEAD WITHOUT CONTRAST; CT CERVICAL SPINE WITHOUT CONTRAST TECHNIQUE: Contiguous axial images were obtained from the base of the skull through the vertex without intravenous contrast. COMPARISON:  October 09, 2018 FINDINGS: Brain: No evidence of acute territorial infarction, hemorrhage, hydrocephalus,extra-axial collection or mass lesion/mass effect. There is dilatation the ventricles and sulci consistent with age-related atrophy. Low-attenuation changes in the deep white matter consistent with small vessel ischemia. Vascular: No hyperdense vessel or unexpected calcification. Skull: The skull is intact. No fracture or focal lesion identified. Sinuses/Orbits: The visualized paranasal sinuses and mastoid air cells are clear. The orbits and globes intact. Other: None Cervical spine: Alignment: There is a minimal anterolisthesis of C4 on C5 and C5 on C6. Skull base and vertebrae: Visualized skull base is intact. No atlanto-occipital dissociation. The vertebral body heights are well maintained. No fracture or pathologic osseous lesion seen. Soft tissues and spinal canal: The visualized paraspinal soft tissues are unremarkable. No prevertebral soft tissue  swelling is seen. The spinal canal is grossly unremarkable, no large epidural collection or significant canal narrowing. Disc levels: Advanced multilevel disc osteophyte and uncovertebral osteophytes noted. This most notable at C5-C6 and C6-C7. Upper chest: The lung apices are clear. Thoracic inlet is within normal limits. Other: None IMPRESSION: No acute intracranial abnormality. Findings consistent with age related atrophy and chronic small vessel ischemia No acute fracture or malalignment of the spine. Electronically Signed   By: Prudencio Pair M.D.   On: 06/14/2019 02:13   Ct Cervical Spine Wo Contrast  Result Date: 06/14/2019 CLINICAL DATA:  Unwitnessed fall at nursing home EXAM: CT HEAD WITHOUT CONTRAST; CT CERVICAL SPINE WITHOUT CONTRAST TECHNIQUE: Contiguous axial images were obtained from the base of the skull through the vertex without intravenous contrast. COMPARISON:  October 09, 2018 FINDINGS: Brain: No evidence of acute territorial infarction, hemorrhage, hydrocephalus,extra-axial collection or mass lesion/mass effect. There is dilatation the ventricles and sulci consistent with age-related atrophy. Low-attenuation changes in the deep white matter consistent with small vessel ischemia. Vascular: No hyperdense vessel or unexpected calcification. Skull: The skull is intact. No fracture or focal lesion identified. Sinuses/Orbits: The visualized paranasal sinuses and mastoid air cells are clear. The orbits and globes intact. Other: None Cervical spine: Alignment: There is a minimal anterolisthesis of C4 on C5 and C5 on C6. Skull base and vertebrae: Visualized skull base is intact. No atlanto-occipital dissociation. The vertebral body heights are well maintained. No fracture or pathologic osseous lesion seen. Soft tissues and spinal canal: The visualized paraspinal soft tissues are unremarkable. No prevertebral soft tissue swelling is seen. The spinal canal is grossly unremarkable, no large epidural  collection or significant canal narrowing. Disc levels: Advanced multilevel disc osteophyte and uncovertebral osteophytes noted. This most notable at C5-C6 and C6-C7. Upper chest: The lung apices are clear. Thoracic inlet is within normal limits. Other: None IMPRESSION: No acute intracranial abnormality. Findings consistent with age related atrophy and chronic small vessel ischemia No acute fracture or malalignment of the spine. Electronically Signed   By: Prudencio Pair M.D.   On: 06/14/2019 02:13    Pertinent labs & imaging results that were available during my care of the patient were reviewed by me and considered in my medical decision making (see chart for details).  Medications Ordered in ED Medications - No data to display  Procedures Procedures  (including critical care time)  Medical Decision Making / ED Course I have reviewed the nursing notes for this encounter and the patient's prior records (if available in EHR or on provided paperwork).   JAEDAN HALLMARK was evaluated in Emergency Department on 06/14/2019 for the symptoms described in the history of present illness. She was evaluated in the context of the global COVID-19 pandemic, which necessitated consideration that the patient might be at risk for infection with the SARS-CoV-2 virus that causes COVID-19. Institutional protocols and algorithms that pertain to the evaluation of patients at risk for COVID-19 are in a state of rapid change based on information released by regulatory bodies including the CDC and federal and state organizations. These policies and algorithms were followed during the patient's care in the ED.  Unwitnessed fall at a skilled nursing facility.  Patient had a small hematoma and skin tear to the left eyebrow.  Also has a skin tear to the left elbow.  No other injuries noted on exam.     CT head and cervical spine negative.  The patient appears reasonably screened and/or stabilized for discharge and I doubt any other medical condition or other [facility] requiring further screening, evaluation, or treatment in the ED at this time prior to discharge.  The patient is safe for discharge with strict return precautions.       Final Clinical Impression(s) / ED Diagnoses Final diagnoses:  Contusion of face, initial encounter  Fall, initial encounter  Skin tear of left elbow without complication, initial encounter     The patient appears reasonably screened and/or stabilized for discharge and I doubt any other medical condition or other [facility] requiring further screening, evaluation, or treatment in the ED at this time prior to discharge.  Disposition: Discharge  Condition: Good   ED Discharge Orders    None        Follow Up: Sande Brothers, MD [address] [place] [address] [phone]  Schedule an appointment as soon as possible for a visit  As needed     This chart was dictated using voice recognition software.  Despite best efforts to proofread,  errors can occur which can change the documentation meaning.   Fatima Blank, MD 06/14/19 [phone]

## 2019-06-14 NOTE — ED Notes (Signed)
[facility] contacted and paperwork printed  

## 2019-06-14 NOTE — ED Notes (Signed)
[facility] at bedside 

## 2019-06-14 NOTE — ED Notes (Signed)
Arm cleaned and dressing applied. Pt given warm blankets.

## 2019-07-26 DIAGNOSIS — E785 Hyperlipidemia, unspecified: Secondary | ICD-10-CM | POA: Diagnosis not present

## 2019-07-26 DIAGNOSIS — F0391 Unspecified dementia with behavioral disturbance: Secondary | ICD-10-CM | POA: Diagnosis not present

## 2019-07-26 DIAGNOSIS — E039 Hypothyroidism, unspecified: Secondary | ICD-10-CM | POA: Diagnosis not present

## 2019-07-26 DIAGNOSIS — I10 Essential (primary) hypertension: Secondary | ICD-10-CM | POA: Diagnosis not present

## 2019-07-26 DIAGNOSIS — E440 Moderate protein-calorie malnutrition: Secondary | ICD-10-CM | POA: Diagnosis not present

## 2019-07-26 DIAGNOSIS — N183 Chronic kidney disease, stage 3 unspecified: Secondary | ICD-10-CM | POA: Diagnosis not present

## 2019-08-09 DIAGNOSIS — M6281 Muscle weakness (generalized): Secondary | ICD-10-CM | POA: Diagnosis not present

## 2019-08-09 DIAGNOSIS — F0391 Unspecified dementia with behavioral disturbance: Secondary | ICD-10-CM | POA: Diagnosis not present

## 2019-08-09 DIAGNOSIS — R2689 Other abnormalities of gait and mobility: Secondary | ICD-10-CM | POA: Diagnosis not present

## 2019-08-09 DIAGNOSIS — I10 Essential (primary) hypertension: Secondary | ICD-10-CM | POA: Diagnosis not present

## 2019-08-16 ENCOUNTER — Encounter (HOSPITAL_COMMUNITY): Payer: Self-pay

## 2019-08-16 ENCOUNTER — Other Ambulatory Visit: Payer: Self-pay

## 2019-08-16 ENCOUNTER — Emergency Department (HOSPITAL_COMMUNITY): Payer: Medicare PPO

## 2019-08-16 ENCOUNTER — Emergency Department (HOSPITAL_COMMUNITY)
Admission: EM | Admit: 2019-08-16 | Discharge: 2019-08-16 | Disposition: A | Discharge status: Home / Self Care | Payer: Medicare PPO | Attending: Emergency Medicine | Admitting: Emergency Medicine

## 2019-08-16 DIAGNOSIS — R112 Nausea with vomiting, unspecified: Secondary | ICD-10-CM

## 2019-08-16 DIAGNOSIS — R404 Transient alteration of awareness: Secondary | ICD-10-CM | POA: Diagnosis not present

## 2019-08-16 DIAGNOSIS — Z7401 Bed confinement status: Secondary | ICD-10-CM | POA: Diagnosis not present

## 2019-08-16 DIAGNOSIS — Z853 Personal history of malignant neoplasm of breast: Secondary | ICD-10-CM | POA: Diagnosis not present

## 2019-08-16 DIAGNOSIS — N183 Chronic kidney disease, stage 3 unspecified: Secondary | ICD-10-CM | POA: Diagnosis not present

## 2019-08-16 DIAGNOSIS — R5381 Other malaise: Secondary | ICD-10-CM | POA: Diagnosis not present

## 2019-08-16 DIAGNOSIS — F0390 Unspecified dementia without behavioral disturbance: Secondary | ICD-10-CM | POA: Diagnosis not present

## 2019-08-16 DIAGNOSIS — N3000 Acute cystitis without hematuria: Secondary | ICD-10-CM | POA: Diagnosis not present

## 2019-08-16 DIAGNOSIS — M2550 Pain in unspecified joint: Secondary | ICD-10-CM | POA: Diagnosis not present

## 2019-08-16 DIAGNOSIS — R1111 Vomiting without nausea: Secondary | ICD-10-CM | POA: Diagnosis not present

## 2019-08-16 DIAGNOSIS — I10 Essential (primary) hypertension: Secondary | ICD-10-CM | POA: Diagnosis not present

## 2019-08-16 DIAGNOSIS — I129 Hypertensive chronic kidney disease with stage 1 through stage 4 chronic kidney disease, or unspecified chronic kidney disease: Secondary | ICD-10-CM | POA: Insufficient documentation

## 2019-08-16 DIAGNOSIS — K920 Hematemesis: Secondary | ICD-10-CM | POA: Diagnosis not present

## 2019-08-16 DIAGNOSIS — R1110 Vomiting, unspecified: Secondary | ICD-10-CM | POA: Diagnosis not present

## 2019-08-16 LAB — COMPREHENSIVE METABOLIC PANEL
ALT: 15 U/L (ref 0–44)
AST: 24 U/L (ref 15–41)
Albumin: 3 g/dL — ABNORMAL LOW (ref 3.5–5.0)
Alkaline Phosphatase: 40 U/L (ref 38–126)
Anion gap: 8 (ref 5–15)
BUN: 28 mg/dL — ABNORMAL HIGH (ref 8–23)
CO2: 28 mmol/L (ref 22–32)
Calcium: 8.6 mg/dL — ABNORMAL LOW (ref 8.9–10.3)
Chloride: 110 mmol/L (ref 98–111)
Creatinine, Ser: 0.76 mg/dL (ref 0.44–1.00)
GFR calc Af Amer: >60 mL/min (ref >60)
GFR calc non Af Amer: >60 mL/min (ref >60)
Glucose, Bld: 101 mg/dL — ABNORMAL HIGH (ref 70–99)
Potassium: 4.2 mmol/L (ref 3.5–5.1)
Sodium: 146 mmol/L — ABNORMAL HIGH (ref 135–145)
Total Bilirubin: 0.4 mg/dL (ref 0.3–1.2)
Total Protein: 6.1 g/dL — ABNORMAL LOW (ref 6.5–8.1)

## 2019-08-16 LAB — CBC WITH DIFFERENTIAL/PLATELET
Abs Immature Granulocytes: 0.04 10*3/uL (ref 0.00–0.07)
Basophils Absolute: 0 10*3/uL (ref 0.0–0.1)
Basophils Relative: 0 %
Eosinophils Absolute: 0 10*3/uL (ref 0.0–0.5)
Eosinophils Relative: 0 %
HCT: 41.7 % (ref 36.0–46.0)
Hemoglobin: 12.6 g/dL (ref 12.0–15.0)
Immature Granulocytes: 1 %
Lymphocytes Relative: 38 %
Lymphs Abs: 2.8 10*3/uL (ref 0.7–4.0)
MCH: 26.5 pg (ref 26.0–34.0)
MCHC: 30.2 g/dL (ref 30.0–36.0)
MCV: 87.6 fL (ref 80.0–100.0)
Monocytes Absolute: 1.4 10*3/uL — ABNORMAL HIGH (ref 0.1–1.0)
Monocytes Relative: 20 %
Neutro Abs: 3 10*3/uL (ref 1.7–7.7)
Neutrophils Relative %: 41 %
Platelets: 227 10*3/uL (ref 150–400)
RBC: 4.76 MIL/uL (ref 3.87–5.11)
RDW: 16.6 % — ABNORMAL HIGH (ref 11.5–15.5)
WBC: 7.3 10*3/uL (ref 4.0–10.5)
nRBC: 0 % (ref 0.0–0.2)

## 2019-08-16 LAB — PROTIME-INR
INR: 1 (ref 0.8–1.2)
Prothrombin Time: 13.1 seconds (ref 11.4–15.2)

## 2019-08-16 LAB — URINALYSIS, ROUTINE W REFLEX MICROSCOPIC
Bilirubin Urine: NEGATIVE
Glucose, UA: NEGATIVE mg/dL
Ketones, ur: 5 mg/dL — AB
Nitrite: POSITIVE — AB
Protein, ur: 30 mg/dL — AB
RBC / HPF: >50 RBC/hpf — ABNORMAL HIGH (ref 0–5)
Specific Gravity, Urine: 1.029 (ref 1.005–1.030)
WBC, UA: >50 WBC/hpf — ABNORMAL HIGH (ref 0–5)
pH: 5 (ref 5.0–8.0)

## 2019-08-16 LAB — LIPASE, BLOOD: Lipase: 23 U/L (ref 11–51)

## 2019-08-16 LAB — TYPE AND SCREEN
ABO/RH(D): A POS
Antibody Screen: NEGATIVE

## 2019-08-16 LAB — POC OCCULT BLOOD, ED: Fecal Occult Bld: NEGATIVE

## 2019-08-16 MED ORDER — CEPHALEXIN 500 MG PO CAPS: 500.0000 mg | ORAL_CAPSULE | Freq: Four times a day (QID) | ORAL | 0 refills | Status: DC

## 2019-08-16 MED ORDER — SODIUM CHLORIDE 0.9 % IV BOLUS
500.0000 mL | Freq: Once | INTRAVENOUS | Status: AC
  Administered 2019-08-16: 500 mL via INTRAVENOUS

## 2019-08-16 MED ORDER — IOHEXOL 300 MG/ML  SOLN
100.0000 mL | Freq: Once | INTRAMUSCULAR | Status: AC | PRN
  Administered 2019-08-16: 80 mL via INTRAVENOUS

## 2019-08-16 MED ORDER — SODIUM CHLORIDE (PF) 0.9 % IJ SOLN
INTRAMUSCULAR | Status: AC
  Filled 2019-08-16: qty 50

## 2019-08-16 MED ORDER — PANTOPRAZOLE SODIUM 40 MG IV SOLR
40.0000 mg | Freq: Once | INTRAVENOUS | Status: AC
  Administered 2019-08-16: 40 mg via INTRAVENOUS
  Filled 2019-08-16: qty 40

## 2019-08-16 MED ORDER — SODIUM CHLORIDE 0.9 % IV SOLN
1.0000 g | Freq: Once | INTRAVENOUS | Status: AC
  Administered 2019-08-16: 1 g via INTRAVENOUS
  Filled 2019-08-16: qty 10

## 2019-08-16 NOTE — ED Notes (Signed)
Katie Silva- daughter [phone]

## 2019-08-16 NOTE — ED Triage Notes (Signed)
Pt BIB EMS from [facility]. Facility reports pt had black emesis last night x1 on night shift. Pt has not had an episode of emesis with EMS. Pt denies pain. Pt not on blood thinners. Pt alert and oriented to self only, this is baseline per facility.   160/78 64 HR 16 Resp CBG 119

## 2019-08-16 NOTE — Discharge Instructions (Signed)
You were seen in the emergency department today with vomiting.  We did not find any blood in the gastrointestinal system.  Your CT scan was normal with the exception of some constipation.  You can take over-the-counter docusate and senna as directed on the box for constipation management.  We also found you to have a urinary tract infection.  We have given you IV antibiotics in the emergency department and will continue antibiotics for the next 7 days at home.  Please return to the emergency department with any new or suddenly worsening symptoms.

## 2019-08-16 NOTE — ED Notes (Signed)
[facility] called  

## 2019-08-16 NOTE — ED Provider Notes (Signed)
Emergency Department Provider Note   I have reviewed the triage vital signs and the nursing notes.   HISTORY  Chief Complaint Black Emesis   HPI Katie Silva is a 85 y.o. female with PMH of dementia (Leve 5 caveat applies) presents to the ED from [facility] with concern for black vomiting x 1 last night. No additional vomiting today or with EMS. Patient does not recall vomiting. She denies pain, SOB, or other symptoms but history limited by her underlying dementia. Patient mental status is at baseline per EMS.   Level 5 caveat: Dementia.   Past Medical History:  Diagnosis Date   Anemia    Arthritis    hands, knees   Breast cancer, left breast ([place]) 2003   s/p mastectomy, chemo.  Dr. Chancy Milroy oncologist   Cerebrovascular accident (CVA) due to bilateral embolism of posterior cerebral arteries [facility])    Cerebrovascular accident (CVA) due to embolism of left anterior cerebral artery [facility])    Cerebrovascular accident (CVA) due to embolism of right middle cerebral artery ([place])    Dementia ([place])    Fracture of humerus, proximal, right, closed 07/2010   s/p hemiarthroplasty   Gastric ulcer with hemorrhage [id number]   Helicobacter pylori (H. pylori) infection 02/15/2013   eradication confirmed 05/2013 UBT   Hypertension    Multiple duodenal ulcers 02/14/2013   New onset atrial fibrillation ([place])    Osteoporosis    Stage 3 chronic kidney disease    Stroke ([place]) 04/2018    Patient Active Problem List   Diagnosis Date Noted   Chest wall hematoma, left, initial encounter 05/04/2019   Hypertension    Cerebrovascular accident (CVA) due to embolism of right middle cerebral artery ([place])    Cerebrovascular accident (CVA) due to embolism of left anterior cerebral artery ([place])    Cerebrovascular accident (CVA) due to bilateral embolism of posterior cerebral arteries ([place])    New onset atrial fibrillation ([place]) 05/02/2018   Acute cardioembolic stroke ([place])  [id number]   Atrial fibrillation with RVR ([place])    Cerebrovascular accident (CVA) due to embolism of precerebral artery ([facility])    Diastolic dysfunction    Benign essential HTN    Stage 3 chronic kidney disease    Stroke ([place]) 04/07/2018   Urinary frequency 11/29/2017   Encounter for completion of form with patient 05/31/2017   Pedal edema 12/31/2015   Dementia ([place]) 03/14/2015   Postoperative anemia 04/14/2012   SVT (supraventricular tachycardia) ([place]) 04/14/2012   Closed left hip fracture ([place]) 04/13/2012   HTN (hypertension) 01/30/2011   Breast cancer, left breast ([place])    Osteoporosis    Fracture of humerus, proximal, right, closed 07/08/2010    Past Surgical History:  Procedure Laterality Date   ABDOMINAL HYSTERECTOMY     BREATH TEK H PYLORI N/A 06/06/2013   Procedure: BREATH TEK H PYLORI;  Surgeon: Gatha Mayer, MD;  Location: [facility] ENDOSCOPY;  Service: Endoscopy;  Laterality: N/A;   ESOPHAGOGASTRODUODENOSCOPY N/A 02/14/2013   Procedure: ESOPHAGOGASTRODUODENOSCOPY (EGD);  Surgeon: Gatha Mayer, MD;  Location: [facility];  Service: Endoscopy;  Laterality: N/A;   FEMUR IM NAIL  04/13/2012   Procedure: INTRAMEDULLARY (IM) NAIL FEMORAL;  Surgeon: Melina Schools, MD;  Location: [facility] ORS;  Service: Orthopedics;  Laterality: Left;   HIP SURGERY     KNEE ARTHROSCOPY Bilateral    BIlateral knees at [place] Left    SHOULDER HEMI-ARTHROPLASTY Right 07/2010   Dr. Mardelle Matte    Allergies Fexofenadine  hcl, Aricept [donepezil hcl], Ativan [lorazepam], Penicillins, and Tylenol [acetaminophen]  Family History  Problem Relation Age of Onset   Myasthenia gravis Mother    Coronary artery disease Father    Colon cancer Sister    GER disease Son     Social History Social History   Tobacco Use   Smoking status: Never Smoker   Smokeless tobacco: Never Used  Substance Use Topics   Alcohol use: No   Drug use: No    Review of Systems  Level 5  caveat: Dementia   ____________________________________________   PHYSICAL EXAM:  VITAL SIGNS: ED Triage Vitals [08/16/19 1108]  Enc Vitals Group     BP (!) 157/79     Pulse Rate 79     Resp 20     Temp 97.7 F (36.5 C)     Temp Source Oral     SpO2 97 %   Constitutional: Alert and oriented to self only (baselined). Well appearing and in no acute distress. Eyes: Conjunctivae are normal.  Head: Atraumatic. Nose: No congestion/rhinnorhea. Mouth/Throat: Mucous membranes are moist.   Neck: No stridor.   Cardiovascular: Normal rate, regular rhythm. Good peripheral circulation. Grossly normal heart sounds.   Respiratory: Normal respiratory effort.  No retractions. Lungs CTAB. Gastrointestinal: Soft and nontender. No distention. Rectal exam performed with nurse tech chaperone. Dark green stool on exam. No fecal impaction. No BRBPR or melena.  Musculoskeletal: No lower extremity tenderness nor edema.  Neurologic:  Normal speech and language.  Skin:  Skin is warm, dry and intact. No rash noted.  ____________________________________________   LABS (all labs ordered are listed, but only abnormal results are displayed)  Labs Reviewed  COMPREHENSIVE METABOLIC PANEL - Abnormal; Notable for the following components:      Result Value   Sodium 146 (*)    Glucose, Bld 101 (*)    BUN 28 (*)    Calcium 8.6 (*)    Total Protein 6.1 (*)    Albumin 3.0 (*)    All other components within normal limits  CBC WITH DIFFERENTIAL/PLATELET - Abnormal; Notable for the following components:   RDW 16.6 (*)    Monocytes Absolute 1.4 (*)    All other components within normal limits  URINALYSIS, ROUTINE W REFLEX MICROSCOPIC - Abnormal; Notable for the following components:   Color, Urine AMBER (*)    APPearance CLOUDY (*)    Hgb urine dipstick SMALL (*)    Ketones, ur 5 (*)    Protein, ur 30 (*)    Nitrite POSITIVE (*)    Leukocytes,Ua SMALL (*)    RBC / HPF >50 (*)    WBC, UA >50 (*)     Bacteria, UA MANY (*)    All other components within normal limits  URINE CULTURE  LIPASE, BLOOD  PROTIME-INR  POC OCCULT BLOOD, ED  TYPE AND SCREEN   ____________________________________________  RADIOLOGY  Dg Chest 2 View  Result Date: 08/16/2019 CLINICAL DATA:  Hematemesis.  Possible mediastinal air. EXAM: CHEST - 2 VIEW COMPARISON:  05/01/2019 FINDINGS: Patient is rotated to the right. Lungs are adequately inflated without focal airspace consolidation. Possible small amount of posterior right pleural fluid. No evidence of mediastinal air. Borderline cardiomegaly unchanged. Calcification over the mitral valve annulus. Remainder of the exam is unchanged. IMPRESSION: Possible small amount of posterior right pleural fluid, otherwise no acute cardiopulmonary disease. No evidence of mediastinal air. Mild stable cardiomegaly. Electronically Signed   By: Marin Olp M.D.   On: 08/16/2019  12:31   Ct Abdomen Pelvis W Contrast  Result Date: 08/16/2019 CLINICAL DATA:  Nausea, vomiting. EXAM: CT ABDOMEN AND PELVIS WITH CONTRAST TECHNIQUE: Multidetector CT imaging of the abdomen and pelvis was performed using the standard protocol following bolus administration of intravenous contrast. CONTRAST:  44mL OMNIPAQUE IOHEXOL 300 MG/ML  SOLN COMPARISON:  None. FINDINGS: Lower chest: Minimal right pleural effusion is noted with adjacent subsegmental atelectasis. Hepatobiliary: No focal liver abnormality is seen. No gallstones, gallbladder wall thickening, or biliary dilatation. Pancreas: Unremarkable. No pancreatic ductal dilatation or surrounding inflammatory changes. Spleen: Normal in size without focal abnormality. Adrenals/Urinary Tract: Adrenal glands appear normal. Right renal cyst is noted. No hydronephrosis or renal obstruction is noted. No renal or ureteral calculi are noted. Urinary bladder is unremarkable. Stomach/Bowel: The stomach appears normal. There is no evidence of bowel obstruction or  inflammation. Stool seen throughout the colon, including large amount of stool in the rectum. The appendix is not visualized. Vascular/Lymphatic: Aortic atherosclerosis. No enlarged abdominal or pelvic lymph nodes. Reproductive: Status post hysterectomy. No adnexal masses. Other: No abdominal wall hernia or abnormality. No abdominopelvic ascites. Musculoskeletal: No acute or significant osseous findings. IMPRESSION: 1. Minimal right pleural effusion with adjacent subsegmental atelectasis. 2. Aortic atherosclerosis. 3. Stool seen throughout the colon, including large amount of stool in the rectum. 4. No other significant abnormality seen in the abdomen or pelvis. Aortic Atherosclerosis (ICD10-I70.0). Electronically Signed   By: Marijo Conception M.D.   On: 08/16/2019 13:48    ____________________________________________   PROCEDURES  Procedure(s) performed:   Procedures  None  ____________________________________________   INITIAL IMPRESSION / ASSESSMENT AND PLAN / ED COURSE  Pertinent labs & imaging results that were available during my care of the patient were reviewed by me and considered in my medical decision making (see chart for details).   Patient presents to the ED with concern for black emesis x 1 last night. Abdomen is soft and non-tender. Rectal with hemoccult negative stool. Plan for labs, CXR to evaluate for any mediastinal air (but low clinical suspicion) and CT abdomen/pelvis given patient's baseline dementia and history provided by SNF to r/o frank obstruction.   03:33 PM  Patient with evidence of urinary tract infection on UA.  Will send for culture and give Rocephin here. CT imaging reviewed with constipation found but no other acute findings. Patient is not significantly altered.  Do not have concern for sepsis.  Seems more hydrated clinically after IV fluids.  Feel that the patient is safe for outpatient management of UTI with Keflex after receiving Rocephin in the ED.  Discussed with family by phone who are comfortable with the plan.   ____________________________________________  FINAL CLINICAL IMPRESSION(S) / ED DIAGNOSES  Final diagnoses:  Non-intractable vomiting with nausea, unspecified vomiting type  Acute cystitis without hematuria     MEDICATIONS GIVEN DURING THIS VISIT:  Medications  sodium chloride (PF) 0.9 % injection (has no administration in time range)  pantoprazole (PROTONIX) injection 40 mg (40 mg Intravenous Given 08/16/19 1204)  sodium chloride 0.9 % bolus 500 mL (0 mLs Intravenous Stopped 08/16/19 1250)  sodium chloride 0.9 % bolus 500 mL (0 mLs Intravenous Stopped 08/16/19 1455)  iohexol (OMNIPAQUE) 300 MG/ML solution 100 mL (80 mLs Intravenous Contrast Given 08/16/19 1320)  cefTRIAXone (ROCEPHIN) 1 g in sodium chloride 0.9 % 100 mL IVPB (0 g Intravenous Stopped 08/16/19 1701)     NEW OUTPATIENT MEDICATIONS STARTED DURING THIS VISIT:  Discharge Medication List as of 08/16/2019  3:33 PM  START taking these medications   Details  cephALEXin (KEFLEX) 500 MG capsule Take 1 capsule (500 mg total) by mouth 4 (four) times daily for 7 days., Starting Wed 08/16/2019, Until Wed 08/23/2019, Print        Note:  This document was prepared using Dragon voice recognition software and may include unintentional dictation errors.  Nanda Quinton, MD, [facility]    Daveda Larock, [facility], MD 08/16/19 [phone]

## 2019-08-19 ENCOUNTER — Emergency Department (HOSPITAL_COMMUNITY): Payer: Medicare PPO

## 2019-08-19 ENCOUNTER — Encounter (HOSPITAL_COMMUNITY): Payer: Self-pay | Admitting: Emergency Medicine

## 2019-08-19 ENCOUNTER — Emergency Department (HOSPITAL_COMMUNITY)
Admission: EM | Admit: 2019-08-19 | Discharge: 2019-08-19 | Disposition: A | Discharge status: Home / Self Care | Payer: Medicare PPO | Source: Home / Self Care | Attending: Emergency Medicine | Admitting: Emergency Medicine

## 2019-08-19 DIAGNOSIS — S0990XA Unspecified injury of head, initial encounter: Secondary | ICD-10-CM

## 2019-08-19 DIAGNOSIS — Z79899 Other long term (current) drug therapy: Secondary | ICD-10-CM | POA: Insufficient documentation

## 2019-08-19 DIAGNOSIS — S299XXA Unspecified injury of thorax, initial encounter: Secondary | ICD-10-CM | POA: Diagnosis not present

## 2019-08-19 DIAGNOSIS — Y92122 Bedroom in nursing home as the place of occurrence of the external cause: Secondary | ICD-10-CM | POA: Insufficient documentation

## 2019-08-19 DIAGNOSIS — U071 COVID-19: Secondary | ICD-10-CM | POA: Diagnosis not present

## 2019-08-19 DIAGNOSIS — Y939 Activity, unspecified: Secondary | ICD-10-CM | POA: Insufficient documentation

## 2019-08-19 DIAGNOSIS — R5381 Other malaise: Secondary | ICD-10-CM | POA: Diagnosis not present

## 2019-08-19 DIAGNOSIS — Y999 Unspecified external cause status: Secondary | ICD-10-CM | POA: Insufficient documentation

## 2019-08-19 DIAGNOSIS — Z8673 Personal history of transient ischemic attack (TIA), and cerebral infarction without residual deficits: Secondary | ICD-10-CM | POA: Insufficient documentation

## 2019-08-19 DIAGNOSIS — I129 Hypertensive chronic kidney disease with stage 1 through stage 4 chronic kidney disease, or unspecified chronic kidney disease: Secondary | ICD-10-CM | POA: Insufficient documentation

## 2019-08-19 DIAGNOSIS — S199XXA Unspecified injury of neck, initial encounter: Secondary | ICD-10-CM | POA: Diagnosis not present

## 2019-08-19 DIAGNOSIS — F0390 Unspecified dementia without behavioral disturbance: Secondary | ICD-10-CM | POA: Insufficient documentation

## 2019-08-19 DIAGNOSIS — W19XXXA Unspecified fall, initial encounter: Secondary | ICD-10-CM | POA: Diagnosis not present

## 2019-08-19 DIAGNOSIS — Z209 Contact with and (suspected) exposure to unspecified communicable disease: Secondary | ICD-10-CM | POA: Diagnosis not present

## 2019-08-19 DIAGNOSIS — N390 Urinary tract infection, site not specified: Secondary | ICD-10-CM

## 2019-08-19 DIAGNOSIS — N183 Chronic kidney disease, stage 3 unspecified: Secondary | ICD-10-CM | POA: Insufficient documentation

## 2019-08-19 DIAGNOSIS — Z743 Need for continuous supervision: Secondary | ICD-10-CM | POA: Diagnosis not present

## 2019-08-19 DIAGNOSIS — W06XXXA Fall from bed, initial encounter: Secondary | ICD-10-CM | POA: Insufficient documentation

## 2019-08-19 DIAGNOSIS — E870 Hyperosmolality and hypernatremia: Secondary | ICD-10-CM | POA: Diagnosis not present

## 2019-08-19 DIAGNOSIS — R001 Bradycardia, unspecified: Secondary | ICD-10-CM | POA: Diagnosis not present

## 2019-08-19 DIAGNOSIS — R279 Unspecified lack of coordination: Secondary | ICD-10-CM | POA: Diagnosis not present

## 2019-08-19 DIAGNOSIS — R52 Pain, unspecified: Secondary | ICD-10-CM

## 2019-08-19 LAB — CBC WITH DIFFERENTIAL/PLATELET
Abs Immature Granulocytes: 0.14 10*3/uL — ABNORMAL HIGH (ref 0.00–0.07)
Basophils Absolute: 0.1 10*3/uL (ref 0.0–0.1)
Basophils Relative: 1 %
Eosinophils Absolute: 0 10*3/uL (ref 0.0–0.5)
Eosinophils Relative: 0 %
HCT: 50.2 % — ABNORMAL HIGH (ref 36.0–46.0)
Hemoglobin: 14.9 g/dL (ref 12.0–15.0)
Immature Granulocytes: 2 %
Lymphocytes Relative: 31 %
Lymphs Abs: 2.8 10*3/uL (ref 0.7–4.0)
MCH: 25.9 pg — ABNORMAL LOW (ref 26.0–34.0)
MCHC: 29.7 g/dL — ABNORMAL LOW (ref 30.0–36.0)
MCV: 87.3 fL (ref 80.0–100.0)
Monocytes Absolute: 1.3 10*3/uL — ABNORMAL HIGH (ref 0.1–1.0)
Monocytes Relative: 14 %
Neutro Abs: 4.8 10*3/uL (ref 1.7–7.7)
Neutrophils Relative %: 52 %
Platelets: 224 10*3/uL (ref 150–400)
RBC: 5.75 MIL/uL — ABNORMAL HIGH (ref 3.87–5.11)
RDW: 16.9 % — ABNORMAL HIGH (ref 11.5–15.5)
WBC: 9.1 10*3/uL (ref 4.0–10.5)
nRBC: 0 % (ref 0.0–0.2)

## 2019-08-19 LAB — BASIC METABOLIC PANEL WITH GFR
Anion gap: 14 (ref 5–15)
BUN: 34 mg/dL — ABNORMAL HIGH (ref 8–23)
CO2: 24 mmol/L (ref 22–32)
Calcium: 8.5 mg/dL — ABNORMAL LOW (ref 8.9–10.3)
Chloride: 110 mmol/L (ref 98–111)
Creatinine, Ser: 0.96 mg/dL (ref 0.44–1.00)
GFR calc Af Amer: 60 mL/min — ABNORMAL LOW
GFR calc non Af Amer: 52 mL/min — ABNORMAL LOW
Glucose, Bld: 137 mg/dL — ABNORMAL HIGH (ref 70–99)
Potassium: 4.5 mmol/L (ref 3.5–5.1)
Sodium: 148 mmol/L — ABNORMAL HIGH (ref 135–145)

## 2019-08-19 MED ORDER — AMOXICILLIN-POT CLAVULANATE 875-125 MG PO TABS
1.0000 | ORAL_TABLET | Freq: Once | ORAL | Status: AC
  Administered 2019-08-19: 1 via ORAL
  Filled 2019-08-19: qty 1

## 2019-08-19 MED ORDER — SODIUM CHLORIDE 0.9 % IV BOLUS
500.0000 mL | Freq: Once | INTRAVENOUS | Status: AC
  Administered 2019-08-19: 17:00:00 500 mL via INTRAVENOUS

## 2019-08-19 MED ORDER — AMOXICILLIN-POT CLAVULANATE 875-125 MG PO TABS: 1.0000 | ORAL_TABLET | Freq: Two times a day (BID) | ORAL | 0 refills | Status: DC

## 2019-08-19 NOTE — ED Notes (Signed)
Patient transported to CT 

## 2019-08-19 NOTE — ED Notes (Signed)
Patient's son, Herbie [place], requesting updates. [phone]. MD made aware.

## 2019-08-19 NOTE — ED Notes (Signed)
Patient given water

## 2019-08-19 NOTE — ED Notes (Signed)
Attempted to call report to [facility], no answer at this time. Will attempt again.

## 2019-08-19 NOTE — ED Notes (Addendum)
[place] front desk [phone] [place] informed patient is coming back, the facility refused her return with unexplain reason, [facility] ED Charge nurse spoke to her that patient will return now, [facility]AR ready to and here.

## 2019-08-19 NOTE — ED Notes (Signed)
ED Provider at bedside. 

## 2019-08-19 NOTE — ED Triage Notes (Signed)
Pt arrived by EMS from [facility] after a witnessed fall out of bed. Staff reports patient hit head; no obvious injury. No loss of consciousness.   Pt tested positive for COVID (EMS does not know date)   Pt has hx of dementia. Patient alert and oriented to self only (baseline).   BP 91/54 RR 24 P 60 T 97.0  99% RA

## 2019-08-19 NOTE — ED Provider Notes (Signed)
[facility] DEPT Provider Note   CSN: [id number] Arrival date & time: 08/19/19  1420     History Chief Complaint  Patient presents with  . Fall    Katie Silva is a 85 y.o. female.  HPI     She presents for evaluation of fall at her ALF.  She reportedly fell from her bed.  Staff at the facility report that she "hit her head."  Also they report that she has been diagnosed with COVID-19 infection.  She was here, 2 days ago, treated for urinary tract infection with Keflex.  The culture from her urine, grew E. coli which is resistant to cefazolin.  Level 5 caveat-dementia  Past Medical History:  Diagnosis Date  . Anemia   . Arthritis    hands, knees  . Breast cancer, left breast ([place]) 2003   s/p mastectomy, chemo.  Dr. Chancy Milroy oncologist  . Cerebrovascular accident (CVA) due to bilateral embolism of posterior cerebral arteries ([place])   . Cerebrovascular accident (CVA) due to embolism of left anterior cerebral artery ([place])   . Cerebrovascular accident (CVA) due to embolism of right middle cerebral artery ([place])   . Dementia ([place])   . Fracture of humerus, proximal, right, closed 07/2010   s/p hemiarthroplasty  . Gastric ulcer with hemorrhage 02/14/2013  . Helicobacter pylori (H. pylori) infection 02/15/2013   eradication confirmed 05/2013 [facility]  . Hypertension   . Multiple duodenal ulcers 02/14/2013  . New onset atrial fibrillation ([place])   . Osteoporosis   . Stage 3 chronic kidney disease   . Stroke [facility]) 04/2018    Patient Active Problem List   Diagnosis Date Noted  . Chest wall hematoma, left, initial encounter 05/04/2019  . Hypertension   . Cerebrovascular accident (CVA) due to embolism of right middle cerebral artery ([place])   . Cerebrovascular accident (CVA) due to embolism of left anterior cerebral artery ([place])   . Cerebrovascular accident (CVA) due to bilateral embolism of posterior cerebral arteries ([place])   . New onset atrial fibrillation  ([place]) 05/02/2018  . Acute cardioembolic stroke ([place]) [id number]  . Atrial fibrillation with RVR ([place])   . Cerebrovascular accident (CVA) due to embolism of precerebral artery ([place])   . Diastolic dysfunction   . Benign essential HTN   . Stage 3 chronic kidney disease   . Stroke ([place]) 04/07/2018  . Urinary frequency 11/29/2017  . Encounter for completion of form with patient 05/31/2017  . Pedal edema 12/31/2015  . Dementia ([place]) 03/14/2015  . Postoperative anemia 04/14/2012  . SVT (supraventricular tachycardia) ([place]) 04/14/2012  . Closed left hip fracture ([place]) 04/13/2012  . HTN (hypertension) 01/30/2011  . Breast cancer, left breast ([place])   . Osteoporosis   . Fracture of humerus, proximal, right, closed 07/08/2010    Past Surgical History:  Procedure Laterality Date  . ABDOMINAL HYSTERECTOMY    . BREATH TEK H PYLORI N/A 06/06/2013   Procedure: BREATH TEK H PYLORI;  Surgeon: Gatha Mayer, MD;  Location: [facility] ENDOSCOPY;  Service: Endoscopy;  Laterality: N/A;  . ESOPHAGOGASTRODUODENOSCOPY N/A 02/14/2013   Procedure: ESOPHAGOGASTRODUODENOSCOPY (EGD);  Surgeon: Gatha Mayer, MD;  Location: [facility];  Service: Endoscopy;  Laterality: N/A;  . FEMUR IM NAIL  04/13/2012   Procedure: INTRAMEDULLARY (IM) NAIL FEMORAL;  Surgeon: Melina Schools, MD;  Location: [facility] ORS;  Service: Orthopedics;  Laterality: Left;  . HIP SURGERY    . KNEE ARTHROSCOPY Bilateral    [facility]lateral knees at [facility]  . MASTECTOMY  Left   . SHOULDER HEMI-ARTHROPLASTY Right 07/2010   Dr. Mardelle Matte     OB History   No obstetric history on file.     Family History  Problem Relation Age of Onset  . Myasthenia gravis Mother   . Coronary artery disease Father   . Colon cancer Sister   . GER disease Son     Social History   Tobacco Use  . Smoking status: Never Smoker  . Smokeless tobacco: Never Used  Substance Use Topics  . Alcohol use: No  . Drug use: No    Home Medications Prior to Admission medications     Medication Sig Start Date End Date Taking? Authorizing Provider  acetaminophen (TYLENOL) 325 MG tablet Take 650 mg by mouth every 4 (four) hours as needed for mild pain.   Yes [provider]  cephALEXin (KEFLEX) 500 MG capsule Take 1 capsule (500 mg total) by mouth 4 (four) times daily for 7 days. 08/16/19 08/23/19 Yes Long, Wonda Olds, MD  divalproex (DEPAKOTE) 125 MG DR tablet Take 250 mg by mouth 2 (two) times daily.    Yes [provider]  docusate sodium (COLACE) 100 MG capsule Take 100 mg by mouth 2 (two) times daily.   Yes [provider]  Ensure (ENSURE) Take 237 mLs by mouth 3 (three) times daily between meals.   Yes [provider]  ferrous sulfate 325 (65 FE) MG tablet Take 1 tablet (325 mg total) by mouth daily. 05/05/19 08/19/19 Yes Elodia Florence., MD  levothyroxine (SYNTHROID) 25 MCG tablet Take 25 mcg by mouth daily before breakfast.   Yes [provider]  lisinopril (ZESTRIL) 20 MG tablet Take 20 mg by mouth daily.   Yes [provider]  metoprolol tartrate (LOPRESSOR) 25 MG tablet Take 0.5 tablets (12.5 mg total) by mouth 2 (two) times daily. Patient taking differently: Take 12.5 mg by mouth 2 (two) times daily. Hold for pulse [id number] or systolic BP [id number]  Yes Regalado, Belkys A, MD  mirtazapine (REMERON) 7.5 MG tablet Take 7.5 mg by mouth at bedtime.   Yes [provider]  Misc Natural Products (OSTEO BI-FLEX JOINT SHIELD PO) Take 1 tablet by mouth daily.   Yes [provider]  Multiple Vitamin (MULTIVITAMIN WITH MINERALS) TABS tablet Take 1 tablet by mouth daily.   Yes [provider]  Skin Protectants, Misc. (DIMETHICONE-ZINC OXIDE) cream Apply 1 application topically 2 (two) times daily.   Yes [provider]    Allergies    Fexofenadine hcl, Aricept [donepezil hcl], Ativan [lorazepam], Penicillins, and Tylenol [acetaminophen]  Review of Systems   Review of Systems  Unable to  perform ROS: Dementia    Physical Exam Updated Vital Signs BP (!) 88/63 (BP Location: Left Arm) Comment: informed the nurse of b/p  Pulse (!) 109   Temp (!) 97.5 F (36.4 C) (Oral)   Resp (!) 24   Physical Exam Vitals and nursing note reviewed.  Constitutional:      Appearance: She is well-developed.     Comments: Frail, elderly  HENT:     Head: Normocephalic and atraumatic.  Eyes:     Conjunctiva/sclera: Conjunctivae normal.     Pupils: Pupils are equal, round, and reactive to light.  Neck:     Trachea: Phonation normal.  Cardiovascular:     Rate and Rhythm: Regular rhythm. Tachycardia present.  Pulmonary:     Effort: Pulmonary effort is normal. No respiratory distress.  Breath sounds: Normal breath sounds. No stridor.  Chest:     Chest wall: No tenderness.  Abdominal:     General: There is no distension.     Palpations: Abdomen is soft.     Tenderness: There is no abdominal tenderness. There is no guarding.  Musculoskeletal:        General: No swelling or tenderness. Normal range of motion.     Cervical back: Normal range of motion and neck supple.  Skin:    General: Skin is warm and dry.  Neurological:     Mental Status: She is alert. Mental status is at baseline.     Motor: No abnormal muscle tone.     Coordination: Coordination normal.     Comments: She is verbal, and responsive.  She is confused.  Psychiatric:        Mood and Affect: Mood normal.        Behavior: Behavior normal.     ED Results / Procedures / Treatments   Labs (all labs ordered are listed, but only abnormal results are displayed) Labs Reviewed - No data to display  EKG None  Radiology No results found.  Procedures Procedures (including critical care time)  Medications Ordered in ED Medications - No data to display  ED Course  I have reviewed the triage vital signs and the nursing notes.  Pertinent labs & imaging results that were available during my care of the patient  were reviewed by me and considered in my medical decision making (see chart for details).  Clinical Course as of Aug 18 1912  Sat Aug 19, 2019  1903 Normal except sodium high, glucose high, BUN high, calcium low, GFR low  Basic metabolic panel(!) [EW]  123456 Normal except hematocrit high  CBC with Differential(!) [EW]  1908 I was able to reach the patient's son's wife on the phone, she agreed to take the information requested by her husband.  We discussed the findings, change in treatment, and appropriateness for discharge.  All questions were answered   [EW]    Clinical Course User Index [EW] Daleen Bo, MD   MDM Rules/Calculators/A&P     CHA2DS2/VAS Stroke Risk Points  Current as of 29 minutes ago     6 >= 2 Points: High Risk  1 - 1.99 Points: Medium Risk  0 Points: Low Risk    This is the only CHA2DS2/VAS Stroke Risk Points available for the past  year.: Last Change: N/A     Details    This score determines the patient's risk of having a stroke if the  patient has atrial fibrillation.       Points Metrics  0 Has Congestive Heart Failure:  No    Current as of 29 minutes ago  0 Has Vascular Disease:  No    Current as of 29 minutes ago  1 Has Hypertension:  Yes    Current as of 29 minutes ago  2 Age:  58    Current as of 29 minutes ago  0 Has Diabetes:  No    Current as of 29 minutes ago  2 Had Stroke:  Yes  Had TIA:  No  Had thromboembolism:  No    Current as of 29 minutes ago  1 Female:  Yes    Current as of 29 minutes ago                          Patient Vitals  for the past 24 hrs:  BP Temp Temp src Pulse Resp SpO2  08/19/19 1830 120/90 -- -- (!) 107 19 100 %  08/19/19 1745 121/80 -- -- -- (!) 27 --  08/19/19 1730 (!) 126/95 -- -- -- -- --  08/19/19 1715 120/77 -- -- (!) 107 (!) 21 --  08/19/19 1639 (!) 126/93 -- -- (!) 106 (!) 28 100 %  08/19/19 1600 115/76 -- -- 100 15 95 %  08/19/19 1529 -- -- -- -- -- 100 %  08/19/19 1515 -- -- -- -- (!) 28 99 %   08/19/19 1448 (!) 88/63 (!) 97.5 F (36.4 C) Oral (!) 109 (!) 24 --    7:04 PM Reevaluation with update and discussion. After initial assessment and treatment, an updated evaluation reveals no change in clinical status.  Findings were discussed with the patient's family members. Daleen Bo   Medical Decision Making: Patient presenting for evaluation of fall, clinical evaluation and imaging does not indicate serious injury.  Laboratory evaluation indicates mild dehydration, worsening from recent baseline when evaluated 3 days ago.  At that time she had been started on Keflex for UTI and culture indicates resistance to that class of medication.  Patient started on Augmentin, and tolerated it here.  Of note, allergy history indicates nonspecific penicillin allergy.  Nursing reports that the patient tested positive for COVID-19.  She is not in respiratory distress, and there is no indication for further evaluation or intervention, for that diagnosis.  Katie Silva was evaluated in Emergency Department on 08/19/2019 for the symptoms described in the history of present illness. She was evaluated in the context of the global COVID-19 pandemic, which necessitated consideration that the patient might be at risk for infection with the SARS-CoV-2 virus that causes COVID-19. Institutional protocols and algorithms that pertain to the evaluation of patients at risk for COVID-19 are in a state of rapid change based on information released by regulatory bodies including the CDC and federal and state organizations. These policies and algorithms were followed during the patient's care in the ED.  CRITICAL CARE-no Performed by: Daleen Bo  Nursing Notes Reviewed/ Care Coordinated Applicable Imaging Reviewed Interpretation of Laboratory Data incorporated into ED treatment  The patient appears reasonably screened and/or stabilized for discharge and I doubt any other medical condition or other [facility] requiring  further screening, evaluation, or treatment in the ED at this time prior to discharge.  Plan: Home Medications-stop cephalexin, and start Augmentin, otherwise continue usual; Home Treatments-push oral fluids with an extra 1 to 2 L of water each day.; return here if the recommended treatment, does not improve the symptoms; Recommended follow up-PCP, as needed   Final Clinical Impression(s) / ED Diagnoses Final diagnoses:  Fall, initial encounter  Injury of head, initial encounter  Urinary tract infection without hematuria, site unspecified  COVID-19 virus infection    Rx / DC Orders ED Discharge Orders         Ordered    amoxicillin-clavulanate (AUGMENTIN) 875-125 MG tablet  2 times daily     08/19/19 1910           Daleen Bo, MD 08/20/19 1101

## 2019-08-19 NOTE — ED Notes (Signed)
Second attempt made to call report to [facility], again, no answer.

## 2019-08-19 NOTE — Discharge Instructions (Addendum)
The testing indicates that there were no serious injuries from the fall today.  She does appear a little bit dehydrated.  We request that she attempt to drink 1 to 2 L of water each day to improve her hydration.  Also we are changing her antibiotic, from cephalexin to amoxicillin/clavulanic acid.  She should be able to tolerate this, even with her history of allergy to penicillin.  Start taking this medication tomorrow morning.  If she develops further problems or you have additional concerns, please return here for evaluation.  Since she was previously diagnosed with COVID-19 infection, it is important to continue to isolate her, and use protection when around her, for the typical duration of symptoms and illness, which is usually 2 weeks.  Make sure that she follows up with her primary care doctor for further care and treatment of the COVID-19 infection, as needed.

## 2019-08-19 NOTE — ED Notes (Signed)
[facility] called for transport.  

## 2019-08-21 ENCOUNTER — Encounter (HOSPITAL_COMMUNITY): Payer: Self-pay | Admitting: Internal Medicine

## 2019-08-21 ENCOUNTER — Emergency Department (HOSPITAL_COMMUNITY): Payer: Medicare PPO

## 2019-08-21 ENCOUNTER — Inpatient Hospital Stay (HOSPITAL_COMMUNITY)
Admission: EM | Admit: 2019-08-21 | Discharge: 2019-08-24 | DRG: 640 | Disposition: A | Discharge status: Hospice | Payer: Medicare PPO | Source: Skilled Nursing Facility | Attending: Student | Admitting: Student

## 2019-08-21 DIAGNOSIS — F0280 Dementia in other diseases classified elsewhere without behavioral disturbance: Secondary | ICD-10-CM | POA: Diagnosis present

## 2019-08-21 DIAGNOSIS — Z515 Encounter for palliative care: Secondary | ICD-10-CM | POA: Diagnosis present

## 2019-08-21 DIAGNOSIS — I480 Paroxysmal atrial fibrillation: Secondary | ICD-10-CM | POA: Diagnosis present

## 2019-08-21 DIAGNOSIS — N183 Chronic kidney disease, stage 3 unspecified: Secondary | ICD-10-CM | POA: Diagnosis present

## 2019-08-21 DIAGNOSIS — L8990 Pressure ulcer of unspecified site, unspecified stage: Secondary | ICD-10-CM | POA: Insufficient documentation

## 2019-08-21 DIAGNOSIS — Z6821 Body mass index (BMI) 21.0-21.9, adult: Secondary | ICD-10-CM | POA: Diagnosis not present

## 2019-08-21 DIAGNOSIS — N390 Urinary tract infection, site not specified: Secondary | ICD-10-CM | POA: Diagnosis present

## 2019-08-21 DIAGNOSIS — R0902 Hypoxemia: Secondary | ICD-10-CM | POA: Diagnosis not present

## 2019-08-21 DIAGNOSIS — Z79890 Hormone replacement therapy (postmenopausal): Secondary | ICD-10-CM

## 2019-08-21 DIAGNOSIS — R627 Adult failure to thrive: Secondary | ICD-10-CM | POA: Diagnosis present

## 2019-08-21 DIAGNOSIS — L89611 Pressure ulcer of right heel, stage 1: Secondary | ICD-10-CM | POA: Diagnosis present

## 2019-08-21 DIAGNOSIS — Z853 Personal history of malignant neoplasm of breast: Secondary | ICD-10-CM | POA: Diagnosis not present

## 2019-08-21 DIAGNOSIS — Z789 Other specified health status: Secondary | ICD-10-CM | POA: Diagnosis not present

## 2019-08-21 DIAGNOSIS — U071 COVID-19: Secondary | ICD-10-CM | POA: Diagnosis present

## 2019-08-21 DIAGNOSIS — Z8249 Family history of ischemic heart disease and other diseases of the circulatory system: Secondary | ICD-10-CM

## 2019-08-21 DIAGNOSIS — Z1612 Extended spectrum beta lactamase (ESBL) resistance: Secondary | ICD-10-CM | POA: Diagnosis present

## 2019-08-21 DIAGNOSIS — Z66 Do not resuscitate: Secondary | ICD-10-CM | POA: Diagnosis present

## 2019-08-21 DIAGNOSIS — E039 Hypothyroidism, unspecified: Secondary | ICD-10-CM | POA: Diagnosis present

## 2019-08-21 DIAGNOSIS — F0390 Unspecified dementia without behavioral disturbance: Secondary | ICD-10-CM | POA: Diagnosis present

## 2019-08-21 DIAGNOSIS — E43 Unspecified severe protein-calorie malnutrition: Secondary | ICD-10-CM | POA: Diagnosis present

## 2019-08-21 DIAGNOSIS — Z8711 Personal history of peptic ulcer disease: Secondary | ICD-10-CM

## 2019-08-21 DIAGNOSIS — Z7189 Other specified counseling: Secondary | ICD-10-CM | POA: Diagnosis not present

## 2019-08-21 DIAGNOSIS — R5381 Other malaise: Secondary | ICD-10-CM | POA: Diagnosis not present

## 2019-08-21 DIAGNOSIS — E870 Hyperosmolality and hypernatremia: Secondary | ICD-10-CM | POA: Diagnosis present

## 2019-08-21 DIAGNOSIS — I10 Essential (primary) hypertension: Secondary | ICD-10-CM | POA: Diagnosis present

## 2019-08-21 DIAGNOSIS — G9341 Metabolic encephalopathy: Secondary | ICD-10-CM | POA: Diagnosis present

## 2019-08-21 DIAGNOSIS — B9620 Unspecified Escherichia coli [E. coli] as the cause of diseases classified elsewhere: Secondary | ICD-10-CM | POA: Diagnosis present

## 2019-08-21 DIAGNOSIS — N1831 Chronic kidney disease, stage 3a: Secondary | ICD-10-CM | POA: Diagnosis not present

## 2019-08-21 DIAGNOSIS — N179 Acute kidney failure, unspecified: Secondary | ICD-10-CM | POA: Diagnosis present

## 2019-08-21 DIAGNOSIS — I1310 Hypertensive heart and chronic kidney disease without heart failure, with stage 1 through stage 4 chronic kidney disease, or unspecified chronic kidney disease: Secondary | ICD-10-CM | POA: Diagnosis present

## 2019-08-21 DIAGNOSIS — L89152 Pressure ulcer of sacral region, stage 2: Secondary | ICD-10-CM | POA: Diagnosis present

## 2019-08-21 DIAGNOSIS — Z8673 Personal history of transient ischemic attack (TIA), and cerebral infarction without residual deficits: Secondary | ICD-10-CM

## 2019-08-21 DIAGNOSIS — E861 Hypovolemia: Secondary | ICD-10-CM | POA: Diagnosis present

## 2019-08-21 DIAGNOSIS — F0391 Unspecified dementia with behavioral disturbance: Secondary | ICD-10-CM | POA: Diagnosis not present

## 2019-08-21 DIAGNOSIS — E860 Dehydration: Secondary | ICD-10-CM | POA: Diagnosis present

## 2019-08-21 DIAGNOSIS — Z800 Family history of malignant neoplasm of digestive organs: Secondary | ICD-10-CM

## 2019-08-21 DIAGNOSIS — Z7401 Bed confinement status: Secondary | ICD-10-CM | POA: Diagnosis not present

## 2019-08-21 DIAGNOSIS — Z9181 History of falling: Secondary | ICD-10-CM

## 2019-08-21 DIAGNOSIS — Z79899 Other long term (current) drug therapy: Secondary | ICD-10-CM

## 2019-08-21 DIAGNOSIS — M810 Age-related osteoporosis without current pathological fracture: Secondary | ICD-10-CM | POA: Diagnosis present

## 2019-08-21 DIAGNOSIS — R52 Pain, unspecified: Secondary | ICD-10-CM | POA: Diagnosis not present

## 2019-08-21 DIAGNOSIS — M2550 Pain in unspecified joint: Secondary | ICD-10-CM | POA: Diagnosis not present

## 2019-08-21 LAB — CBC WITH DIFFERENTIAL/PLATELET
Abs Immature Granulocytes: 0.22 10*3/uL — ABNORMAL HIGH (ref 0.00–0.07)
Basophils Absolute: 0.1 10*3/uL (ref 0.0–0.1)
Basophils Relative: 1 %
Eosinophils Absolute: 0 10*3/uL (ref 0.0–0.5)
Eosinophils Relative: 0 %
HCT: 45.3 % (ref 36.0–46.0)
Hemoglobin: 13.5 g/dL (ref 12.0–15.0)
Immature Granulocytes: 2 %
Lymphocytes Relative: 20 %
Lymphs Abs: 2 10*3/uL (ref 0.7–4.0)
MCH: 25.8 pg — ABNORMAL LOW (ref 26.0–34.0)
MCHC: 29.8 g/dL — ABNORMAL LOW (ref 30.0–36.0)
MCV: 86.5 fL (ref 80.0–100.0)
Monocytes Absolute: 1 10*3/uL (ref 0.1–1.0)
Monocytes Relative: 11 %
Neutro Abs: 6.5 10*3/uL (ref 1.7–7.7)
Neutrophils Relative %: 66 %
Platelets: 226 10*3/uL (ref 150–400)
RBC: 5.24 MIL/uL — ABNORMAL HIGH (ref 3.87–5.11)
RDW: 16.7 % — ABNORMAL HIGH (ref 11.5–15.5)
WBC: 9.7 10*3/uL (ref 4.0–10.5)
nRBC: 0 % (ref 0.0–0.2)

## 2019-08-21 LAB — URINALYSIS, ROUTINE W REFLEX MICROSCOPIC
Bilirubin Urine: NEGATIVE
Glucose, UA: NEGATIVE mg/dL
Hgb urine dipstick: NEGATIVE
Ketones, ur: 5 mg/dL — AB
Leukocytes,Ua: NEGATIVE
Nitrite: NEGATIVE
Protein, ur: 30 mg/dL — AB
Specific Gravity, Urine: 1.028 (ref 1.005–1.030)
pH: 5 (ref 5.0–8.0)

## 2019-08-21 LAB — URINE CULTURE
Culture: >=100000 — AB
Special Requests: NORMAL

## 2019-08-21 LAB — COMPREHENSIVE METABOLIC PANEL
ALT: 29 U/L (ref 0–44)
AST: 56 U/L — ABNORMAL HIGH (ref 15–41)
Albumin: 2.9 g/dL — ABNORMAL LOW (ref 3.5–5.0)
Alkaline Phosphatase: 35 U/L — ABNORMAL LOW (ref 38–126)
Anion gap: 13 (ref 5–15)
BUN: 56 mg/dL — ABNORMAL HIGH (ref 8–23)
CO2: 26 mmol/L (ref 22–32)
Calcium: 8.8 mg/dL — ABNORMAL LOW (ref 8.9–10.3)
Chloride: 116 mmol/L — ABNORMAL HIGH (ref 98–111)
Creatinine, Ser: 1.37 mg/dL — ABNORMAL HIGH (ref 0.44–1.00)
GFR calc Af Amer: 39 mL/min — ABNORMAL LOW (ref >60)
GFR calc non Af Amer: 34 mL/min — ABNORMAL LOW (ref >60)
Glucose, Bld: 100 mg/dL — ABNORMAL HIGH (ref 70–99)
Potassium: 4.4 mmol/L (ref 3.5–5.1)
Sodium: 155 mmol/L — ABNORMAL HIGH (ref 135–145)
Total Bilirubin: 0.6 mg/dL (ref 0.3–1.2)
Total Protein: 6.3 g/dL — ABNORMAL LOW (ref 6.5–8.1)

## 2019-08-21 LAB — VALPROIC ACID LEVEL: Valproic Acid Lvl: 33 ug/mL — ABNORMAL LOW (ref 50.0–100.0)

## 2019-08-21 LAB — I-STAT CHEM 8, ED
BUN: 48 mg/dL — ABNORMAL HIGH (ref 8–23)
Calcium, Ion: 1.17 mmol/L (ref 1.15–1.40)
Chloride: 117 mmol/L — ABNORMAL HIGH (ref 98–111)
Creatinine, Ser: 1.4 mg/dL — ABNORMAL HIGH (ref 0.44–1.00)
Glucose, Bld: 93 mg/dL (ref 70–99)
HCT: 41 % (ref 36.0–46.0)
Hemoglobin: 13.9 g/dL (ref 12.0–15.0)
Potassium: 4 mmol/L (ref 3.5–5.1)
Sodium: 154 mmol/L — ABNORMAL HIGH (ref 135–145)
TCO2: 27 mmol/L (ref 22–32)

## 2019-08-21 LAB — D-DIMER, QUANTITATIVE: D-Dimer, Quant: 4.63 ug/mL-FEU — ABNORMAL HIGH (ref 0.00–0.50)

## 2019-08-21 LAB — BRAIN NATRIURETIC PEPTIDE: B Natriuretic Peptide: 531.1 pg/mL — ABNORMAL HIGH (ref 0.0–100.0)

## 2019-08-21 LAB — FIBRINOGEN: Fibrinogen: 412 mg/dL (ref 210–475)

## 2019-08-21 LAB — LACTATE DEHYDROGENASE: LDH: 294 U/L — ABNORMAL HIGH (ref 98–192)

## 2019-08-21 LAB — C-REACTIVE PROTEIN: CRP: 9.2 mg/dL — ABNORMAL HIGH (ref <1.0)

## 2019-08-21 LAB — FERRITIN: Ferritin: 806 ng/mL — ABNORMAL HIGH (ref 11–307)

## 2019-08-21 LAB — PROCALCITONIN: Procalcitonin: <0.1 ng/mL

## 2019-08-21 LAB — ABO/RH: ABO/RH(D): A POS

## 2019-08-21 MED ORDER — METOPROLOL TARTRATE 25 MG PO TABS
12.5000 mg | ORAL_TABLET | Freq: Two times a day (BID) | ORAL | Status: DC
  Filled 2019-08-21: qty 1

## 2019-08-21 MED ORDER — ENSURE ENLIVE PO LIQD: 237.0000 mL | Freq: Three times a day (TID) | ORAL | Status: DC

## 2019-08-21 MED ORDER — LEVOTHYROXINE SODIUM 25 MCG PO TABS: 25.0000 ug | ORAL_TABLET | Freq: Every day | ORAL | Status: DC

## 2019-08-21 MED ORDER — ACETAMINOPHEN 325 MG PO TABS: 650.0000 mg | ORAL_TABLET | Freq: Four times a day (QID) | ORAL | Status: DC | PRN

## 2019-08-21 MED ORDER — ENOXAPARIN SODIUM 40 MG/0.4ML ~~LOC~~ SOLN: 40.0000 mg | SUBCUTANEOUS | Status: DC

## 2019-08-21 MED ORDER — MIRTAZAPINE 15 MG PO TABS
7.5000 mg | ORAL_TABLET | Freq: Every day | ORAL | Status: DC
  Filled 2019-08-21: qty 1

## 2019-08-21 MED ORDER — FERROUS SULFATE 325 (65 FE) MG PO TABS: 325.0000 mg | ORAL_TABLET | Freq: Every day | ORAL | Status: DC

## 2019-08-21 MED ORDER — DEXTROSE 5 % IV SOLN: INTRAVENOUS | Status: DC

## 2019-08-21 MED ORDER — LISINOPRIL 20 MG PO TABS: 20.0000 mg | ORAL_TABLET | Freq: Every day | ORAL | Status: DC

## 2019-08-21 MED ORDER — DOCUSATE SODIUM 100 MG PO CAPS
100.0000 mg | ORAL_CAPSULE | Freq: Two times a day (BID) | ORAL | Status: DC
  Filled 2019-08-21: qty 1

## 2019-08-21 MED ORDER — ACETAMINOPHEN 650 MG RE SUPP
650.0000 mg | Freq: Four times a day (QID) | RECTAL | Status: DC | PRN
  Administered 2019-08-22 (×2): 650 mg via RECTAL
  Filled 2019-08-21 (×2): qty 1

## 2019-08-21 MED ORDER — HEPARIN SODIUM (PORCINE) 5000 UNIT/ML IJ SOLN
5000.0000 [IU] | Freq: Three times a day (TID) | INTRAMUSCULAR | Status: DC
  Administered 2019-08-21 – 2019-08-22 (×3): 5000 [IU] via SUBCUTANEOUS
  Filled 2019-08-21 (×3): qty 1

## 2019-08-21 NOTE — ED Triage Notes (Signed)
Pt arrives via [facility] for failure to thrive. Patient has a hx of dementia and has reportedly not been eating or drinking appropriately. Facility reports concerns about failure to thrive.

## 2019-08-21 NOTE — Progress Notes (Addendum)
Received pt from the ED, pt skin dry with multiple areas of ecchymosis and dryness. Pt with pressure areas on hips and heels foam applied, tele ap[lied, VS noted. SRP, RN

## 2019-08-21 NOTE — H&P (Signed)
History and Physical    Katie Silva [id number] DOB: [birth year] DOA: 08/21/2019  PCP: Sande Brothers, MD   Patient coming from: Home.  I have personally briefly reviewed patient's old medical records in [place]  Chief Complaint: AMS.  HPI: Katie Silva is a 85 y.o. female with medical history significant of normocytic anemia, osteoarthritis, history of left breast cancer, history of CVA x3, history of dementia, history of humeral fracture, history of gastric ulcer with hemorrhage, history of duodenal ulcers, H. pylori infection, hypertension, osteoporosis, paroxysmal atrial fibrillation, stage III CKD who is brought from her facility [facility]) due to decreased mentation and decreased oral intake over the past few days.  She has been diagnosed with COVID-19 infection at her facility.  She is unable to provide further history.  ED Course: Initial vital signs temperature 97.9 F, pulse 61, respiration 19, blood pressure 143/74 mmHg O2 sat 95% on room air.  Her urinalysis shows ketonuria 5 and proteinuria 30 mg/dL.  Microscopic examination only shows rare bacteria.  CBC has a white count of 9.7 with a normal differential, hemoglobin 13.5 g/dL and platelets 226.  Sodium is 155, potassium 4.4, chloride 116 and CO2 26 mmol/L.  BUN was 56, glucose 100, creatinine 1.37 and calcium 8.8 mg/dL.  Total protein 6.3 and albumin 2.9 g/dL.  AST is slightly elevated at 56 and alkaline phosphatase slightly decreased at 35.  Review of Systems: As per HPI otherwise 10 point review of systems negative.   Past Medical History:  Diagnosis Date  . Anemia   . Arthritis    hands, knees  . Breast cancer, left breast ([place]) 2003   s/p mastectomy, chemo.  Dr. Chancy Milroy oncologist  . Cerebrovascular accident (CVA) due to bilateral embolism of posterior cerebral arteries ([place])   . Cerebrovascular accident (CVA) due to embolism of left anterior cerebral artery ([place])   . Cerebrovascular accident  (CVA) due to embolism of right middle cerebral artery ([place])   . Dementia ([place])   . Fracture of humerus, proximal, right, closed 07/2010   s/p hemiarthroplasty  . Gastric ulcer with hemorrhage 02/14/2013  . Helicobacter pylori (H. pylori) infection 02/15/2013   eradication confirmed 05/2013 [facility]  . Hypertension   . Multiple duodenal ulcers 02/14/2013  . New onset atrial fibrillation ([place])   . Osteoporosis   . Stage 3 chronic kidney disease   . Stroke [facility]) 04/2018    Past Surgical History:  Procedure Laterality Date  . ABDOMINAL HYSTERECTOMY    . BREATH TEK H PYLORI N/A 06/06/2013   Procedure: BREATH TEK H PYLORI;  Surgeon: Gatha Mayer, MD;  Location: [facility] ENDOSCOPY;  Service: Endoscopy;  Laterality: N/A;  . ESOPHAGOGASTRODUODENOSCOPY N/A 02/14/2013   Procedure: ESOPHAGOGASTRODUODENOSCOPY (EGD);  Surgeon: Gatha Mayer, MD;  Location: [facility];  Service: Endoscopy;  Laterality: N/A;  . FEMUR IM NAIL  04/13/2012   Procedure: INTRAMEDULLARY (IM) NAIL FEMORAL;  Surgeon: Melina Schools, MD;  Location: [facility] ORS;  Service: Orthopedics;  Laterality: Left;  . HIP SURGERY    . KNEE ARTHROSCOPY Bilateral    [facility]  . MASTECTOMY Left   . SHOULDER HEMI-ARTHROPLASTY Right 07/2010   Dr. Mardelle Matte     reports that she has never smoked. She has never used smokeless tobacco. She reports that she does not drink alcohol or use drugs.  Allergies  Allergen Reactions  . Fexofenadine Hcl Other (See Comments)    Doesn't agree with patient  . Aricept [Donepezil Hcl] Other (  See Comments)    nightmares  . Ativan [Lorazepam] Other (See Comments)    On MAR  . Penicillins Other (See Comments)    Tolerated Augmentin 12/20. Pt does not remember reaction to PCN ("did not work") Did it involve swelling of the face/tongue/throat, SOB, or low BP? Unknown Did it involve sudden or severe rash/hives, skin peeling, or any reaction on the inside of your mouth or nose? Unknown Did you need to seek medical  attention at a hospital or doctor's office? Unknown When did it last happen? If all above answers are "NO", may proceed with cephalosporin use.   . Tylenol [Acetaminophen] Other (See Comments)    "funny feeling in head."    Family History  Problem Relation Age of Onset  . Myasthenia gravis Mother   . Coronary artery disease Father   . Colon cancer Sister   . GER disease Son    Prior to Admission medications   Medication Sig Start Date End Date Taking? Authorizing Provider  acetaminophen (TYLENOL) 325 MG tablet Take 650 mg by mouth every 4 (four) hours as needed for mild pain.   Yes [provider]  divalproex (DEPAKOTE) 125 MG DR tablet Take 250 mg by mouth 2 (two) times daily.    Yes [provider]  divalproex (DEPAKOTE) 125 MG DR tablet Take 125 mg by mouth every 12 (twelve) hours as needed (agitation/anticonvulsant).   Yes [provider]  docusate sodium (COLACE) 100 MG capsule Take 100 mg by mouth 2 (two) times daily.   Yes [provider]  Ensure (ENSURE) Take 237 mLs by mouth 3 (three) times daily between meals.   Yes [provider]  ferrous sulfate 325 (65 FE) MG tablet Take 1 tablet (325 mg total) by mouth daily. 05/05/19 08/21/19 Yes Elodia Florence., MD  levothyroxine (SYNTHROID) 25 MCG tablet Take 25 mcg by mouth daily before breakfast.   Yes [provider]  lisinopril (ZESTRIL) 20 MG tablet Take 20 mg by mouth daily.   Yes [provider]  metoprolol tartrate (LOPRESSOR) 25 MG tablet Take 0.5 tablets (12.5 mg total) by mouth 2 (two) times daily. Patient taking differently: Take 12.5 mg by mouth 2 (two) times daily. Hold for pulse [id number] or systolic BP [id number]  Yes Regalado, Belkys A, MD  mirtazapine (REMERON) 7.5 MG tablet Take 7.5 mg by mouth at bedtime.   Yes [provider]  Misc Natural Products (OSTEO BI-FLEX JOINT SHIELD PO) Take 1 tablet by mouth daily.   Yes [provider]  Multiple Vitamin (MULTIVITAMIN WITH MINERALS) TABS tablet Take 1 tablet by mouth daily.   Yes [provider]  Skin Protectants, Misc. (DIMETHICONE-ZINC OXIDE) cream Apply 1 application topically 2 (two) times daily.   Yes [provider]    Physical Exam: Vitals:   08/21/19 1338 08/21/19 1352 08/21/19 1607  BP:  (!) 143/74 (!) 167/55  Pulse:  61 80  Resp:  19 (!) 25  Temp:  97.9 F (36.6 C)   TempSrc:  Axillary   SpO2: 96% 95% 100%    Constitutional: Somnolent, but sometimes answers simple questions Eyes: PERRL, lids and conjunctivae normal ENMT: Mucous membranes are very dry. Posterior pharynx clear of any exudate or lesions. Neck: normal, supple, no masses, no thyromegaly Respiratory: Decreased breath sounds in bases, otherwise clear to auscultation bilaterally, no wheezing, no crackles. Normal respiratory effort. No accessory muscle use.  Cardiovascular: Regular rate and rhythm, no murmurs / rubs / gallops.  No extremity edema. 2+ pedal pulses. No carotid bruits.  Abdomen: Nondistended.  Soft, no tenderness, no masses palpated. No hepatosplenomegaly. Bowel sounds positive.  Musculoskeletal: no clubbing / cyanosis.  Good ROM, no contractures. Normal muscle tone.  Skin: Frail skin with some areas of ecchymosis multiple stages of healing Neurologic: Global weakness.  Does not follow commands.  Unable to fully evaluate. Psychiatric: Oriented to name only.  Sometimes answers simple questions  Labs on Admission: I have personally reviewed following labs and imaging studies  CBC: Recent Labs  Lab 08/16/19 1119 08/19/19 1641 08/21/19 1342 08/21/19 1407  WBC 7.3 9.1 9.7  --   NEUTROABS 3.0 4.8 6.5  --   HGB 12.6 14.9 13.5 13.9  HCT 41.7 50.2* 45.3 41.0  MCV 87.6 87.3 86.5  --   PLT 227 224 226  --    Basic Metabolic Panel: Recent Labs  Lab 08/16/19 1119 08/19/19 1641 08/21/19 1342 08/21/19 1407  NA 146* 148* 155* 154*  K 4.2 4.5 4.4 4.0  CL 110  110 116* 117*  CO2 28 24 26   --   GLUCOSE 101* 137* 100* 93  BUN 28* 34* 56* 48*  CREATININE 0.76 0.96 1.37* 1.40*  CALCIUM 8.6* 8.5* 8.8*  --    GFR: CrCl cannot be calculated (Unknown ideal weight.). Liver Function Tests: Recent Labs  Lab 08/16/19 1119 08/21/19 1342  AST 24 56*  ALT 15 29  ALKPHOS 40 35*  BILITOT 0.4 0.6  PROT 6.1* 6.3*  ALBUMIN 3.0* 2.9*   Recent Labs  Lab 08/16/19 1119  LIPASE 23   No results for input(s): AMMONIA in the last 168 hours. Coagulation Profile: Recent Labs  Lab 08/16/19 1119  INR 1.0   Cardiac Enzymes: No results for input(s): CKTOTAL, CKMB, CKMBINDEX, TROPONINI in the last 168 hours. BNP (last 3 results) No results for input(s): PROBNP in the last 8760 hours. HbA1C: No results for input(s): HGBA1C in the last 72 hours. CBG: No results for input(s): GLUCAP in the last 168 hours. Lipid Profile: No results for input(s): CHOL, HDL, LDLCALC, TRIG, CHOLHDL, LDLDIRECT in the last 72 hours. Thyroid Function Tests: No results for input(s): TSH, T4TOTAL, FREET4, T3FREE, THYROIDAB in the last 72 hours. Anemia Panel: No results for input(s): VITAMINB12, FOLATE, FERRITIN, TIBC, IRON, RETICCTPCT in the last 72 hours. Urine analysis:    Component Value Date/Time   COLORURINE AMBER (A) 08/21/2019 1608   APPEARANCEUR HAZY (A) 08/21/2019 1608   LABSPEC 1.028 08/21/2019 1608   PHURINE 5.0 08/21/2019 1608   GLUCOSEU NEGATIVE 08/21/2019 1608   HGBUR NEGATIVE 08/21/2019 1608   BILIRUBINUR NEGATIVE 08/21/2019 1608   BILIRUBINUR Negative 11/29/2017 1605   KETONESUR 5 (A) 08/21/2019 1608   PROTEINUR 30 (A) 08/21/2019 1608   UROBILINOGEN 0.2 11/29/2017 1605   UROBILINOGEN 0.2 10/08/2009 1326   NITRITE NEGATIVE 08/21/2019 1608   LEUKOCYTESUR NEGATIVE 08/21/2019 1608    Radiological Exams on Admission: DG Chest Port 1 View  Result Date: 08/21/2019 CLINICAL DATA:  Failure to thrive.  History of dementia. EXAM: PORTABLE CHEST 1 VIEW  COMPARISON:  Single-view of the chest 08/19/2019. CT chest 05/04/2019. FINDINGS: The patient is rotated on the exam. Lungs are clear. Heart size is normal. No pneumothorax or pleural effusion. Surgical clips left axilla noted. No acute or focal bony abnormality. IMPRESSION: No acute disease. Electronically Signed   By: Inge Rise M.D.   On: 08/21/2019 13:59    EKG: Independently reviewed.  Vent. rate 74 BPM PR interval * ms  QRS duration 108 ms QT/QTc 384/426 ms P-R-T axes 47 -3 270 Sinus rhythm LVH with secondary repolarization abnormality  Assessment/Plan Principal Problem:   Hypernatremia Her free water deficit is 2.5 L. Observation/telemetry. Hydrate with D5W. Hold Depakote. Check valproic acid level. Monitor intake and output. Follow-up renal function and electrolytes.  Active Problems:   2019 novel coronavirus disease (COVID-19) Check SARS 2 PCR. No significant respiratory disease. Not aware if the patient has had GI symptoms. Continue airborne and contact precautions. Check inflammatory markers.    AKI (acute kidney injury) ([place])   Stage 3 chronic kidney disease This seems to be prerenal. Hold lisinopril. Continue IV hydration. Follow-up renal function electrolytes.    Hypertension Hold ACE inhibitor. Continue metoprolol 12.5 mg p.o. twice daily.    Dementia ([place]) Continue Remeron at bedtime. Hold Depakote until valproic acid level is back. Supportive care.   DVT prophylaxis: Lovenox SQ. Code Status: DNR. Family Communication: Disposition Plan: Observation for IV hydration. Consults called: Admission status: Observation/telemetry.   Reubin Milan MD [name]  If 7PM-7AM, please contact night-coverage [email or web address]  08/21/2019, 6:46 PM   This document was prepared using Dragon voice recognition software and may contain some unintended transcription errors.

## 2019-08-22 ENCOUNTER — Other Ambulatory Visit: Payer: Self-pay

## 2019-08-22 DIAGNOSIS — N390 Urinary tract infection, site not specified: Secondary | ICD-10-CM | POA: Diagnosis present

## 2019-08-22 DIAGNOSIS — L89611 Pressure ulcer of right heel, stage 1: Secondary | ICD-10-CM | POA: Diagnosis present

## 2019-08-22 DIAGNOSIS — G9341 Metabolic encephalopathy: Secondary | ICD-10-CM

## 2019-08-22 DIAGNOSIS — L8990 Pressure ulcer of unspecified site, unspecified stage: Secondary | ICD-10-CM | POA: Insufficient documentation

## 2019-08-22 DIAGNOSIS — Z66 Do not resuscitate: Secondary | ICD-10-CM

## 2019-08-22 DIAGNOSIS — I480 Paroxysmal atrial fibrillation: Secondary | ICD-10-CM | POA: Diagnosis present

## 2019-08-22 DIAGNOSIS — F0280 Dementia in other diseases classified elsewhere without behavioral disturbance: Secondary | ICD-10-CM | POA: Diagnosis present

## 2019-08-22 DIAGNOSIS — M810 Age-related osteoporosis without current pathological fracture: Secondary | ICD-10-CM | POA: Diagnosis present

## 2019-08-22 DIAGNOSIS — N179 Acute kidney failure, unspecified: Secondary | ICD-10-CM | POA: Diagnosis present

## 2019-08-22 DIAGNOSIS — Z1612 Extended spectrum beta lactamase (ESBL) resistance: Secondary | ICD-10-CM | POA: Diagnosis present

## 2019-08-22 DIAGNOSIS — Z853 Personal history of malignant neoplasm of breast: Secondary | ICD-10-CM

## 2019-08-22 DIAGNOSIS — E870 Hyperosmolality and hypernatremia: Secondary | ICD-10-CM | POA: Diagnosis present

## 2019-08-22 DIAGNOSIS — F0391 Unspecified dementia with behavioral disturbance: Secondary | ICD-10-CM

## 2019-08-22 DIAGNOSIS — B9620 Unspecified Escherichia coli [E. coli] as the cause of diseases classified elsewhere: Secondary | ICD-10-CM | POA: Diagnosis present

## 2019-08-22 DIAGNOSIS — E860 Dehydration: Secondary | ICD-10-CM | POA: Diagnosis present

## 2019-08-22 DIAGNOSIS — Z8673 Personal history of transient ischemic attack (TIA), and cerebral infarction without residual deficits: Secondary | ICD-10-CM | POA: Diagnosis not present

## 2019-08-22 DIAGNOSIS — U071 COVID-19: Secondary | ICD-10-CM | POA: Diagnosis present

## 2019-08-22 DIAGNOSIS — Z789 Other specified health status: Secondary | ICD-10-CM | POA: Diagnosis not present

## 2019-08-22 DIAGNOSIS — L89152 Pressure ulcer of sacral region, stage 2: Secondary | ICD-10-CM | POA: Diagnosis present

## 2019-08-22 DIAGNOSIS — Z515 Encounter for palliative care: Secondary | ICD-10-CM | POA: Diagnosis present

## 2019-08-22 DIAGNOSIS — Z7189 Other specified counseling: Secondary | ICD-10-CM

## 2019-08-22 DIAGNOSIS — E43 Unspecified severe protein-calorie malnutrition: Secondary | ICD-10-CM

## 2019-08-22 DIAGNOSIS — E039 Hypothyroidism, unspecified: Secondary | ICD-10-CM | POA: Diagnosis present

## 2019-08-22 DIAGNOSIS — I1310 Hypertensive heart and chronic kidney disease without heart failure, with stage 1 through stage 4 chronic kidney disease, or unspecified chronic kidney disease: Secondary | ICD-10-CM | POA: Diagnosis present

## 2019-08-22 DIAGNOSIS — N183 Chronic kidney disease, stage 3 unspecified: Secondary | ICD-10-CM | POA: Diagnosis present

## 2019-08-22 DIAGNOSIS — E861 Hypovolemia: Secondary | ICD-10-CM | POA: Diagnosis present

## 2019-08-22 DIAGNOSIS — Z6821 Body mass index (BMI) 21.0-21.9, adult: Secondary | ICD-10-CM | POA: Diagnosis not present

## 2019-08-22 DIAGNOSIS — R5381 Other malaise: Secondary | ICD-10-CM

## 2019-08-22 DIAGNOSIS — R627 Adult failure to thrive: Secondary | ICD-10-CM | POA: Diagnosis present

## 2019-08-22 LAB — CBC WITH DIFFERENTIAL/PLATELET
Abs Immature Granulocytes: 0.3 10*3/uL — ABNORMAL HIGH (ref 0.00–0.07)
Basophils Absolute: 0.1 10*3/uL (ref 0.0–0.1)
Basophils Relative: 1 %
Eosinophils Absolute: 0 10*3/uL (ref 0.0–0.5)
Eosinophils Relative: 0 %
HCT: 42.7 % (ref 36.0–46.0)
Hemoglobin: 12.6 g/dL (ref 12.0–15.0)
Immature Granulocytes: 3 %
Lymphocytes Relative: 16 %
Lymphs Abs: 1.7 10*3/uL (ref 0.7–4.0)
MCH: 25.7 pg — ABNORMAL LOW (ref 26.0–34.0)
MCHC: 29.5 g/dL — ABNORMAL LOW (ref 30.0–36.0)
MCV: 87 fL (ref 80.0–100.0)
Monocytes Absolute: 1 10*3/uL (ref 0.1–1.0)
Monocytes Relative: 10 %
Neutro Abs: 7.2 10*3/uL (ref 1.7–7.7)
Neutrophils Relative %: 70 %
Platelets: 229 10*3/uL (ref 150–400)
RBC: 4.91 MIL/uL (ref 3.87–5.11)
RDW: 16.7 % — ABNORMAL HIGH (ref 11.5–15.5)
WBC: 10.3 10*3/uL (ref 4.0–10.5)
nRBC: 0.2 % (ref 0.0–0.2)

## 2019-08-22 LAB — D-DIMER, QUANTITATIVE: D-Dimer, Quant: 3.67 ug/mL-FEU — ABNORMAL HIGH (ref 0.00–0.50)

## 2019-08-22 LAB — COMPREHENSIVE METABOLIC PANEL
ALT: 29 U/L (ref 0–44)
AST: 54 U/L — ABNORMAL HIGH (ref 15–41)
Albumin: 2.7 g/dL — ABNORMAL LOW (ref 3.5–5.0)
Alkaline Phosphatase: 32 U/L — ABNORMAL LOW (ref 38–126)
Anion gap: 11 (ref 5–15)
BUN: 46 mg/dL — ABNORMAL HIGH (ref 8–23)
CO2: 23 mmol/L (ref 22–32)
Calcium: 8.4 mg/dL — ABNORMAL LOW (ref 8.9–10.3)
Chloride: 115 mmol/L — ABNORMAL HIGH (ref 98–111)
Creatinine, Ser: 0.97 mg/dL (ref 0.44–1.00)
GFR calc Af Amer: 59 mL/min — ABNORMAL LOW (ref >60)
GFR calc non Af Amer: 51 mL/min — ABNORMAL LOW (ref >60)
Glucose, Bld: 139 mg/dL — ABNORMAL HIGH (ref 70–99)
Potassium: 4 mmol/L (ref 3.5–5.1)
Sodium: 149 mmol/L — ABNORMAL HIGH (ref 135–145)
Total Bilirubin: 0.6 mg/dL (ref 0.3–1.2)
Total Protein: 5.8 g/dL — ABNORMAL LOW (ref 6.5–8.1)

## 2019-08-22 LAB — GLUCOSE, CAPILLARY: Glucose-Capillary: 131 mg/dL — ABNORMAL HIGH (ref 70–99)

## 2019-08-22 LAB — SARS CORONAVIRUS 2 (TAT 6-24 HRS): SARS Coronavirus 2: POSITIVE — AB

## 2019-08-22 LAB — FERRITIN: Ferritin: 887 ng/mL — ABNORMAL HIGH (ref 11–307)

## 2019-08-22 LAB — MAGNESIUM: Magnesium: 2.7 mg/dL — ABNORMAL HIGH (ref 1.7–2.4)

## 2019-08-22 LAB — C-REACTIVE PROTEIN: CRP: 9.6 mg/dL — ABNORMAL HIGH (ref <1.0)

## 2019-08-22 LAB — PHOSPHORUS: Phosphorus: 2.4 mg/dL — ABNORMAL LOW (ref 2.5–4.6)

## 2019-08-22 MED ORDER — MORPHINE SULFATE (PF) 2 MG/ML IV SOLN
1.0000 mg | INTRAVENOUS | Status: DC | PRN
  Administered 2019-08-23 – 2019-08-24 (×2): 1 mg via INTRAVENOUS
  Filled 2019-08-22 (×2): qty 1

## 2019-08-22 MED ORDER — METOPROLOL TARTRATE 5 MG/5ML IV SOLN
2.5000 mg | Freq: Four times a day (QID) | INTRAVENOUS | Status: DC
  Administered 2019-08-22: 2.5 mg via INTRAVENOUS
  Filled 2019-08-22: qty 5

## 2019-08-22 MED ORDER — HALOPERIDOL LACTATE 5 MG/ML IJ SOLN
0.5000 mg | INTRAMUSCULAR | Status: DC | PRN
  Administered 2019-08-22: 0.5 mg via INTRAVENOUS
  Filled 2019-08-22: qty 1

## 2019-08-22 MED ORDER — ONDANSETRON 4 MG PO TBDP: 4.0000 mg | ORAL_TABLET | Freq: Four times a day (QID) | ORAL | Status: DC | PRN

## 2019-08-22 MED ORDER — ACETAMINOPHEN 650 MG RE SUPP: 650.0000 mg | Freq: Four times a day (QID) | RECTAL | Status: DC | PRN

## 2019-08-22 MED ORDER — LORAZEPAM 2 MG/ML PO CONC: 1.0000 mg | ORAL | Status: DC | PRN

## 2019-08-22 MED ORDER — LORAZEPAM 2 MG/ML IJ SOLN: 1.0000 mg | INTRAMUSCULAR | Status: DC | PRN

## 2019-08-22 MED ORDER — METOPROLOL TARTRATE 5 MG/5ML IV SOLN
2.5000 mg | Freq: Once | INTRAVENOUS | Status: AC
  Administered 2019-08-22: 2.5 mg via INTRAVENOUS
  Filled 2019-08-22: qty 5

## 2019-08-22 MED ORDER — DILTIAZEM HCL 25 MG/5ML IV SOLN
10.0000 mg | Freq: Four times a day (QID) | INTRAVENOUS | Status: DC | PRN
  Filled 2019-08-22: qty 5

## 2019-08-22 MED ORDER — HALOPERIDOL 0.5 MG PO TABS
0.5000 mg | ORAL_TABLET | ORAL | Status: DC | PRN
  Filled 2019-08-22: qty 1

## 2019-08-22 MED ORDER — HALOPERIDOL LACTATE 2 MG/ML PO CONC
0.5000 mg | ORAL | Status: DC | PRN
  Filled 2019-08-22: qty 0.3

## 2019-08-22 MED ORDER — SODIUM CHLORIDE 0.9 % IV SOLN
12.5000 mg | Freq: Four times a day (QID) | INTRAVENOUS | Status: DC | PRN
  Filled 2019-08-22: qty 0.5

## 2019-08-22 MED ORDER — INFLUENZA VAC A&B SA ADJ QUAD 0.5 ML IM PRSY: 0.5000 mL | PREFILLED_SYRINGE | INTRAMUSCULAR | Status: DC

## 2019-08-22 MED ORDER — POLYVINYL ALCOHOL 1.4 % OP SOLN
1.0000 [drp] | Freq: Four times a day (QID) | OPHTHALMIC | Status: DC | PRN
  Filled 2019-08-22: qty 15

## 2019-08-22 MED ORDER — GLYCOPYRROLATE 1 MG PO TABS
1.0000 mg | ORAL_TABLET | ORAL | Status: DC | PRN
  Filled 2019-08-22: qty 1

## 2019-08-22 MED ORDER — LORAZEPAM 1 MG PO TABS: 1.0000 mg | ORAL_TABLET | ORAL | Status: DC | PRN

## 2019-08-22 MED ORDER — ONDANSETRON HCL 4 MG/2ML IJ SOLN: 4.0000 mg | Freq: Four times a day (QID) | INTRAMUSCULAR | Status: DC | PRN

## 2019-08-22 MED ORDER — GLYCOPYRROLATE 0.2 MG/ML IJ SOLN: 0.2000 mg | INTRAMUSCULAR | Status: DC | PRN

## 2019-08-22 MED ORDER — ACETAMINOPHEN 325 MG PO TABS: 650.0000 mg | ORAL_TABLET | Freq: Four times a day (QID) | ORAL | Status: DC | PRN

## 2019-08-22 NOTE — Progress Notes (Signed)
Pt Full Comfort care. Pt turned and reposition, offered food and jello, refused to open mouth shaking head signing NO do not want anything to eat and drink. Pt did not open mouth, as she does not present interested in taking in liquids. Pt inct of bowel and bladder. Pt has Purewick in place and draining. Pt resting comfortably when she is not being repositioned or turned; will moan and groan when being turned. VS noted,Teylenol given for elevated temp.  O2 sat 93 % on RA.  Spoke with daughter [name] and messaged MD earlier in shift to phone daughter. SRP, RN

## 2019-08-22 NOTE — Progress Notes (Signed)
Pt Full Comfort care, Tele d/c per md order, IV fluids discontinued. Pt resting comfortably with eyes closed. Will cont to monitor.SRP, RN

## 2019-08-22 NOTE — Progress Notes (Signed)
PROGRESS NOTE  Katie Silva [id number] DOB: 28-Nov-1925   PCP: Sande Brothers, MD  Patient is from: [facility]  DOA: 08/21/2019 LOS: 0  Brief Narrative / Interim history: 85 year old female with history of advanced dementia, left breast cancer, CVA x3, gastric and duodenal ulcer, H. pylori, HTN, osteoporosis, paroxysmal A. Fib, and humeral fracture presenting with altered mental status and poor p.o. intake.  Recently diagnosed with COVID-19.   Of note, patient had ED visit on 12/9 and 12/12 for emesis and fall respectively.  UA obtained on 12/9 concerning for UTI.  She was discharged on Keflex.  Urine culture grew ESBL E. coli.  In ED, hemodynamically stable and saturating at 95% on RA.  Hypernatremic to 155.  CBC within normal.  Creatinine 1.37.  BUN 56.  AST 56.  UA with 5 ketones and rare bacteria.  Admitted for acute metabolic encephalopathy, hypernatremia and failure to thrive.  Started on IV dextrose.   Subjective: No major events overnight of this morning.  Patient sleepy and only moans to physical stimulation.  Does not appear to be in distress.  Saturating at 96% on room air.   Objective: Vitals:   08/21/19 2127 08/21/19 2134 08/22/19 0445 08/22/19 0528  BP: (!) 194/100 (!) 171/62 131/76 (!) 151/92  Pulse: 96 79 (!) 141 (!) 103  Resp: 20   (!) 24  Temp: 100 F (37.8 C)     TempSrc: Oral     SpO2: 99%   98%  Weight:      Height:       No intake or output data in the 24 hours ending 08/22/19 1215 Filed Weights   08/21/19 1857  Weight: 51.1 kg    Examination:  GENERAL: Frail.  No apparent distress HEENT: MMM.  Vision and hearing grossly intact.  NECK: Supple.  No apparent JVD.  RESP:  No IWOB.  Fair air movement bilaterally. CVS: Slightly tachycardic to 110. Heart sounds normal.  ABD/GI/GU: Bowel sounds present. Soft. Moans with abdominal palpation. MSK/EXT:  No apparent deformity or edema. Moves extremities. SKIN: no apparent skin lesion or  wound NEURO: Sleepy. Only moans to physical stimulation.  No apparent focal neuro deficit but limited exam due to mental status. PSYCH: Sleepy  Procedures:  None  Assessment & Plan: Acute metabolic encephalopathy in patient with advanced dementia: Differential diagnosis include dehydration, hypernatremia, COVID-19 infection, azotemia, ESBL E. coli UTI, seizure, iatrogenic (Depakote and Remeron) or delirium. She remains encephalopathic. Only moans to physical stimulation.  No apparent focal neuro deficit.  Dehydration/hypovolemic hypernatremia: At risk for dehydration due to advanced dementia.  She has prerenal azotemia/AKI and ketonuria suggestive for dehydration.  Hypernatremia improved   COVID-19 infection: No apparent respiratory distress.  Saturating at 96% on room air.  Inflammatory markers elevated.  Unclear how long she has COVID-19  Acute kidney injury/azotemia: Likely prerenal from poor p.o. intake: Improving.  Essential hypertension: BP elevated but improving.  A. fib with intermittent RVR: On metoprolol for rate control.  Not on anticoagulation due to risk of fall.  History of CVA: No apparent focal neuro deficits other than altered mental status which limits her neuro exam.  Does not seem to be on any medication.   ESBL E. coli bacteriuria vs UTI: Urine culture on 12/9 with ESBL E. Coli. -Pharmacy discussed with ID who suggested not treating without clinical finding suggesting for UTI.  Urinalysis is not impressive here.    History of seizure?  Not listed on her history.  Noted Depakote  on her home medication  Hypothyroidism:   Pressure Injury 08/21/19 Heel Right;Left Stage I -  Intact skin with non-blanchable redness of a localized area usually over a bony prominence. redden area blanchable (Active)  08/21/19 1857  Location: Heel  Location Orientation: Right;Left  Staging: Stage I -  Intact skin with non-blanchable redness of a localized area usually over a bony  prominence.  Wound Description (Comments): redden area blanchable  Present on Admission: Yes     Pressure Injury 08/21/19 Sacrum Mid Stage II -  Partial thickness loss of dermis presenting as a shallow open ulcer with a red, pink wound bed without slough. redden and excorated (Active)  08/21/19 1857  Location: Sacrum  Location Orientation: Mid  Staging: Stage II -  Partial thickness loss of dermis presenting as a shallow open ulcer with a red, pink wound bed without slough.  Wound Description (Comments): redden and excorated  Present on Admission: Yes   Severe malnutrition: BMI 21.29.  Significant weight loss and muscle mass loss. Wt Readings from Last 10 Encounters:  08/21/19 51.1 kg  05/04/19 61.3 kg  06/13/18 59.9 kg  05/02/18 110.6 kg  11/29/17 62.8 kg  09/13/17 60.9 kg  07/01/17 59.6 kg  05/31/17 60.6 kg  12/03/16 61.7 kg  12/31/15 64.2 kg  -Consult SLP and dietitian.   Goals of care:  I had extensive discussion with patient's daughter, [name], [place] about patient's condition as above.  Confirmed DNR/DNI status.  At baseline patient could only feed and dress herself but has been in decline since she tested positive for Covid about 10 days ago. She has been isolated which made it hard for her.  Patient's daughter noted significant decline in her mental status and physical condition.  She was not even able to recall her when she went to visit at facility.  We discussed about patient's prognosis in light of her current illness, underlying comorbidity which I believe is very poor, may be days at maximum.  Patient's daughter understandably stated, "my mother is ready to go and be with God.  I am sure she does not want to live like this.  I do not want her life to be prolonged. Please make her comfortable for the time she have left".  I have discussed about comfort measures only care which emphasizes alleviation of pain and suffering. [name] voiced understanding and appreciated the  communication and honest discussion. She asked about visitation. I told her we will clarify and let her know. Comfort measures initiated at 13:05 p.m.  Anticipate in-hospital death.          Microbiology summarized: COVID-19 positive Urine culture on 12/9 with ESBL E. coli  Sch Meds:  Scheduled Meds: . docusate sodium  100 mg Oral BID  . feeding supplement (ENSURE ENLIVE)  237 mL Oral TID BM  . ferrous sulfate  325 mg Oral Q breakfast  . heparin injection (subcutaneous)  5,000 Units Subcutaneous Q8H  . [START ON 08/23/2019] influenza vaccine adjuvanted  0.5 mL Intramuscular Tomorrow-1000  . levothyroxine  25 mcg Oral QAC breakfast  . metoprolol tartrate  2.5 mg Intravenous Q6H  . mirtazapine  7.5 mg Oral QHS   Continuous Infusions: . dextrose     PRN Meds:.acetaminophen **OR** acetaminophen, diltiazem  Antimicrobials: Anti-infectives (From admission, onward)   None       I have personally reviewed the following labs and images: CBC: Recent Labs  Lab 08/16/19 1119 08/19/19 1641 08/21/19 1342 08/21/19 1407 08/22/19 0335  WBC  7.3 9.1 9.7  --  10.3  NEUTROABS 3.0 4.8 6.5  --  7.2  HGB 12.6 14.9 13.5 13.9 12.6  HCT 41.7 50.2* 45.3 41.0 42.7  MCV 87.6 87.3 86.5  --  87.0  PLT 227 224 226  --  229   BMP &GFR Recent Labs  Lab 08/16/19 1119 08/19/19 1641 08/21/19 1342 08/21/19 1407 08/22/19 0335  NA 146* 148* 155* 154* 149*  K 4.2 4.5 4.4 4.0 4.0  CL 110 110 116* 117* 115*  CO2 28 24 26   --  23  GLUCOSE 101* 137* 100* 93 139*  BUN 28* 34* 56* 48* 46*  CREATININE 0.76 0.96 1.37* 1.40* 0.97  CALCIUM 8.6* 8.5* 8.8*  --  8.4*  MG  --   --   --   --  2.7*  PHOS  --   --   --   --  2.4*   Estimated Creatinine Clearance: 28.5 mL/min (by C-G formula based on SCr of 0.97 mg/dL). Liver & Pancreas: Recent Labs  Lab 08/16/19 1119 08/21/19 1342 08/22/19 0335  AST 24 56* 54*  ALT 15 29 29   ALKPHOS 40 35* 32*  BILITOT 0.4 0.6 0.6  PROT 6.1* 6.3* 5.8*  ALBUMIN  3.0* 2.9* 2.7*   Recent Labs  Lab 08/16/19 1119  LIPASE 23   No results for input(s): AMMONIA in the last 168 hours. Diabetic: No results for input(s): HGBA1C in the last 72 hours. Recent Labs  Lab 08/22/19 0905  GLUCAP 131*   Cardiac Enzymes: No results for input(s): CKTOTAL, CKMB, CKMBINDEX, TROPONINI in the last 168 hours. No results for input(s): PROBNP in the last 8760 hours. Coagulation Profile: Recent Labs  Lab 08/16/19 1119  INR 1.0   Thyroid Function Tests: No results for input(s): TSH, T4TOTAL, FREET4, T3FREE, THYROIDAB in the last 72 hours. Lipid Profile: No results for input(s): CHOL, HDL, LDLCALC, TRIG, CHOLHDL, LDLDIRECT in the last 72 hours. Anemia Panel: Recent Labs    08/21/19 1957 08/22/19 0335  FERRITIN 806* 887*   Urine analysis:    Component Value Date/Time   COLORURINE AMBER (A) 08/21/2019 1608   APPEARANCEUR HAZY (A) 08/21/2019 1608   LABSPEC 1.028 08/21/2019 1608   PHURINE 5.0 08/21/2019 1608   GLUCOSEU NEGATIVE 08/21/2019 1608   HGBUR NEGATIVE 08/21/2019 1608   BILIRUBINUR NEGATIVE 08/21/2019 1608   BILIRUBINUR Negative 11/29/2017 1605   KETONESUR 5 (A) 08/21/2019 1608   PROTEINUR 30 (A) 08/21/2019 1608   UROBILINOGEN 0.2 11/29/2017 1605   UROBILINOGEN 0.2 10/08/2009 1326   NITRITE NEGATIVE 08/21/2019 1608   LEUKOCYTESUR NEGATIVE 08/21/2019 1608   Sepsis Labs: Invalid input(s): PROCALCITONIN, [place]  Microbiology: Recent Results (from the past 240 hour(s))  Urine culture     Status: Abnormal   Collection Time: 08/16/19  2:17 PM   Specimen: Urine, Catheterized  Result Value Ref Range Status   Specimen Description   Final    URINE, CATHETERIZED Performed at [facility] [address] [place], [place] [address]    Special Requests   Final    Normal Performed at [facility], [place] [address], [place], [place] [address]    Culture (A)  Final    >=100,000 COLONIES/mL ESCHERICHIA  COLI Confirmed Extended Spectrum Beta-Lactamase Producer (ESBL).  In bloodstream infections from ESBL organisms, carbapenems are preferred over piperacillin/tazobactam. They are shown to have a lower risk of mortality.    Report Status 08/21/2019 FINAL  Final   Organism ID, Bacteria ESCHERICHIA COLI (  A)  Final      Susceptibility   Escherichia coli - MIC*    AMPICILLIN >=32 RESISTANT Resistant     CEFAZOLIN >=64 RESISTANT Resistant     CEFTRIAXONE RESISTANT Resistant     CIPROFLOXACIN >=4 RESISTANT Resistant     GENTAMICIN <=1 SENSITIVE Sensitive     IMIPENEM <=0.25 SENSITIVE Sensitive     NITROFURANTOIN <=16 SENSITIVE Sensitive     TRIMETH/SULFA >=320 RESISTANT Resistant     AMPICILLIN/SULBACTAM 4 SENSITIVE Sensitive     PIP/TAZO <=4 SENSITIVE Sensitive     * >=100,000 COLONIES/mL ESCHERICHIA COLI  SARS CORONAVIRUS 2 (TAT 6-24 HRS) Nasopharyngeal Nasopharyngeal Swab     Status: Abnormal   Collection Time: 08/21/19  4:08 PM   Specimen: Nasopharyngeal Swab  Result Value Ref Range Status   SARS Coronavirus 2 POSITIVE (A) NEGATIVE Final    Comment: RESULT CALLED TO, READ BACK BY AND VERIFIED WITH: RN ALEXIS FRAZIER AT 0255 BY MESSAN HOUEGNIFIO ON 08/22/2019 (NOTE) SARS-CoV-2 target nucleic acids are DETECTED. The SARS-CoV-2 RNA is generally detectable in upper and lower respiratory specimens during the acute phase of infection. Positive results are indicative of the presence of SARS-CoV-2 RNA. Clinical correlation with patient history and other diagnostic information is  necessary to determine patient infection status. Positive results do not rule out bacterial infection or co-infection with other viruses.  The expected result is Negative. Fact Sheet for Patients: SugarRoll.be Fact Sheet for Healthcare Providers: https://www.woods-mathews.com/ This test is not yet approved or cleared by the [place] FDA and  has been authorized for  detection and/or diagnosis of SARS-CoV-2 by FDA under an Emergency Use Authorization (EUA). This EUA will remain  in effect (meaning this  test can be used) for the duration of the COVID-19 declaration under Section 564(b)(1) of the Act, 21 U.S.C. section 360bbb-3(b)(1), unless the authorization is terminated or revoked sooner. Performed at [facility], [place] [address] [place], [place] [address]     Radiology Studies: DG Chest Port 1 View  Result Date: 08/21/2019 CLINICAL DATA:  Failure to thrive.  History of dementia. EXAM: PORTABLE CHEST 1 VIEW COMPARISON:  Single-view of the chest 08/19/2019. CT chest 05/04/2019. FINDINGS: The patient is rotated on the exam. Lungs are clear. Heart size is normal. No pneumothorax or pleural effusion. Surgical clips left axilla noted. No acute or focal bony abnormality. IMPRESSION: No acute disease. Electronically Signed   By: Inge Rise M.D.   On: 08/21/2019 13:59   60 minutes with more than 50% spent in reviewing records, counseling patient/family and coordinating care.   Angelea Penny T. [place]  If 7PM-7AM, please contact night-coverage [email or web address] Password [email or web address] 08/22/2019, 12:15 PM

## 2019-08-22 NOTE — ED Provider Notes (Signed)
[facility] Provider Note   CSN: [id number] Arrival date & time: 08/21/19  1327     History Chief Complaint  Patient presents with  . Failure To Thrive  . COVID +    Katie Silva is a 85 y.o. female.  Patient brought to the emergency department because of failure to thrive not eating or drinking.  The history is provided by the nursing home and medical records.  Weakness Severity:  Moderate Onset quality:  Sudden Timing:  Constant Progression:  Worsening Chronicity:  Recurrent Context: not alcohol use   Relieved by:  Nothing      Past Medical History:  Diagnosis Date  . Anemia   . Arthritis    hands, knees  . Breast cancer, left breast ([place]) 2003   s/p mastectomy, chemo.  Dr. Chancy Milroy oncologist  . Cerebrovascular accident (CVA) due to bilateral embolism of posterior cerebral arteries ([place])   . Cerebrovascular accident (CVA) due to embolism of left anterior cerebral artery ([place])   . Cerebrovascular accident (CVA) due to embolism of right middle cerebral artery ([place])   . Dementia ([place])   . Fracture of humerus, proximal, right, closed 07/2010   s/p hemiarthroplasty  . Gastric ulcer with hemorrhage 02/14/2013  . Helicobacter pylori (H. pylori) infection 02/15/2013   eradication confirmed 05/2013 [facility]  . Hypertension   . Multiple duodenal ulcers 02/14/2013  . New onset atrial fibrillation ([place])   . Osteoporosis   . Stage 3 chronic kidney disease   . Stroke [employer]) 04/2018    Patient Active Problem List   Diagnosis Date Noted  . Pressure injury of skin 08/22/2019  . Hypernatremia 08/21/2019  . 2019 novel coronavirus disease (COVID-19) 08/21/2019  . AKI (acute kidney injury) ([place]) 08/21/2019  . Chest wall hematoma, left, initial encounter 05/04/2019  . Hypertension   . Cerebrovascular accident (CVA) due to embolism of right middle cerebral artery ([place])   . Cerebrovascular accident (CVA) due to embolism of left anterior  cerebral artery ([place])   . Cerebrovascular accident (CVA) due to bilateral embolism of posterior cerebral arteries ([place])   . New onset atrial fibrillation ([place]) 05/02/2018  . Acute cardioembolic stroke ([place]) [id number]  . Atrial fibrillation with RVR ([place])   . Cerebrovascular accident (CVA) due to embolism of precerebral artery ([place])   . Diastolic dysfunction   . Benign essential HTN   . Stage 3 chronic kidney disease   . Stroke ([place]) 04/07/2018  . Urinary frequency 11/29/2017  . Encounter for completion of form with patient 05/31/2017  . Pedal edema 12/31/2015  . Dementia ([place]) 03/14/2015  . Postoperative anemia 04/14/2012  . SVT (supraventricular tachycardia) ([place]) 04/14/2012  . Closed left hip fracture ([place]) 04/13/2012  . HTN (hypertension) 01/30/2011  . Breast cancer, left breast ([place])   . Osteoporosis   . Fracture of humerus, proximal, right, closed 07/08/2010    Past Surgical History:  Procedure Laterality Date  . ABDOMINAL HYSTERECTOMY    . BREATH TEK H PYLORI N/A 06/06/2013   Procedure: BREATH TEK H PYLORI;  Surgeon: Gatha Mayer, MD;  Location: [facility];  Service: Endoscopy;  Laterality: N/A;  . ESOPHAGOGASTRODUODENOSCOPY N/A 02/14/2013   Procedure: ESOPHAGOGASTRODUODENOSCOPY (EGD);  Surgeon: Gatha Mayer, MD;  Location: [facility];  Service: Endoscopy;  Laterality: N/A;  . FEMUR IM NAIL  04/13/2012   Procedure: INTRAMEDULLARY (IM) NAIL FEMORAL;  Surgeon: Melina Schools, MD;  Location: [facility]S;  Service: Orthopedics;  Laterality: Left;  . HIP SURGERY    .  KNEE ARTHROSCOPY Bilateral    [facility]  . MASTECTOMY Left   . SHOULDER HEMI-ARTHROPLASTY Right 07/2010   Dr. Mardelle Matte     OB History   No obstetric history on file.     Family History  Problem Relation Age of Onset  . Myasthenia gravis Mother   . Coronary artery disease Father   . Colon cancer Sister   . GER disease Son     Social History   Tobacco Use  . Smoking status: Never Smoker    . Smokeless tobacco: Never Used  Substance Use Topics  . Alcohol use: No  . Drug use: No    Home Medications Prior to Admission medications   Medication Sig Start Date End Date Taking? Authorizing Provider  acetaminophen (TYLENOL) 325 MG tablet Take 650 mg by mouth every 4 (four) hours as needed for mild pain.   Yes [provider]  divalproex (DEPAKOTE) 125 MG DR tablet Take 250 mg by mouth 2 (two) times daily.    Yes [provider]  divalproex (DEPAKOTE) 125 MG DR tablet Take 125 mg by mouth every 12 (twelve) hours as needed (agitation/anticonvulsant).   Yes [provider]  docusate sodium (COLACE) 100 MG capsule Take 100 mg by mouth 2 (two) times daily.   Yes [provider]  Ensure (ENSURE) Take 237 mLs by mouth 3 (three) times daily between meals.   Yes [provider]  ferrous sulfate 325 (65 FE) MG tablet Take 1 tablet (325 mg total) by mouth daily. 05/05/19 08/21/19 Yes Elodia Florence., MD  levothyroxine (SYNTHROID) 25 MCG tablet Take 25 mcg by mouth daily before breakfast.   Yes [provider]  lisinopril (ZESTRIL) 20 MG tablet Take 20 mg by mouth daily.   Yes [provider]  metoprolol tartrate (LOPRESSOR) 25 MG tablet Take 0.5 tablets (12.5 mg total) by mouth 2 (two) times daily. Patient taking differently: Take 12.5 mg by mouth 2 (two) times daily. Hold for pulse [id number] or systolic BP [id number]  Yes Regalado, Belkys A, MD  mirtazapine (REMERON) 7.5 MG tablet Take 7.5 mg by mouth at bedtime.   Yes [provider]  Misc Natural Products (OSTEO BI-FLEX JOINT SHIELD PO) Take 1 tablet by mouth daily.   Yes [provider]  Multiple Vitamin (MULTIVITAMIN WITH MINERALS) TABS tablet Take 1 tablet by mouth daily.   Yes [provider]  Skin Protectants, Misc. (DIMETHICONE-ZINC OXIDE) cream Apply 1 application topically 2 (two) times daily.   Yes [provider]    Allergies     Fexofenadine hcl, Aricept [donepezil hcl], Ativan [lorazepam], Penicillins, and Tylenol [acetaminophen]  Review of Systems   Review of Systems  Unable to perform ROS: Mental status change  Neurological: Positive for weakness.    Physical Exam Updated Vital Signs BP (!) 151/92 (BP Location: Right Arm)   Pulse (!) 103   Temp 100 F (37.8 C) (Oral)   Resp (!) 24   Ht 5\' 1"  (1.549 m)   Wt 51.1 kg   SpO2 98%   BMI 21.29 kg/m   Physical Exam Vitals and nursing note reviewed.  Constitutional:      Appearance: She is well-developed.     Comments: Lethargic  HENT:     Head: Normocephalic.     Comments: Mucous membranes dry    Nose: Nose normal.  Eyes:     General: No scleral icterus.    Conjunctiva/sclera: Conjunctivae normal.  Neck:  Thyroid: No thyromegaly.  Cardiovascular:     Rate and Rhythm: Normal rate and regular rhythm.     Heart sounds: No murmur. No friction rub. No gallop.   Pulmonary:     Breath sounds: No stridor. No wheezing or rales.  Chest:     Chest wall: No tenderness.  Abdominal:     General: There is no distension.     Tenderness: There is no abdominal tenderness. There is no rebound.  Musculoskeletal:        General: No swelling.     Cervical back: Neck supple.  Lymphadenopathy:     Cervical: No cervical adenopathy.  Skin:    Findings: No erythema or rash.  Neurological:     Motor: No abnormal muscle tone.     Coordination: Coordination normal (.edcr).     Comments: Patient lethargic does not respond to verbal stimuli appropriately.    Psychiatric:        Behavior: Behavior normal.     ED Results / Procedures / Treatments   Labs (all labs ordered are listed, but only abnormal results are displayed) Labs Reviewed  SARS CORONAVIRUS 2 (TAT 6-24 HRS) - Abnormal; Notable for the following components:      Result Value   SARS Coronavirus 2 POSITIVE (*)    All other components within normal limits  CBC WITH DIFFERENTIAL/PLATELET -  Abnormal; Notable for the following components:   RBC 5.24 (*)    MCH 25.8 (*)    MCHC 29.8 (*)    RDW 16.7 (*)    Abs Immature Granulocytes 0.22 (*)    All other components within normal limits  COMPREHENSIVE METABOLIC PANEL - Abnormal; Notable for the following components:   Sodium 155 (*)    Chloride 116 (*)    Glucose, Bld 100 (*)    BUN 56 (*)    Creatinine, Ser 1.37 (*)    Calcium 8.8 (*)    Total Protein 6.3 (*)    Albumin 2.9 (*)    AST 56 (*)    Alkaline Phosphatase 35 (*)    GFR calc non Af Amer 34 (*)    GFR calc Af Amer 39 (*)    All other components within normal limits  URINALYSIS, ROUTINE W REFLEX MICROSCOPIC - Abnormal; Notable for the following components:   Color, Urine AMBER (*)    APPearance HAZY (*)    Ketones, ur 5 (*)    Protein, ur 30 (*)    Bacteria, UA RARE (*)    All other components within normal limits  LACTATE DEHYDROGENASE - Abnormal; Notable for the following components:   LDH 294 (*)    All other components within normal limits  FERRITIN - Abnormal; Notable for the following components:   Ferritin 806 (*)    All other components within normal limits  C-REACTIVE PROTEIN - Abnormal; Notable for the following components:   CRP 9.2 (*)    All other components within normal limits  BRAIN NATRIURETIC PEPTIDE - Abnormal; Notable for the following components:   B Natriuretic Peptide 531.1 (*)    All other components within normal limits  D-DIMER, QUANTITATIVE (NOT AT [facility]) - Abnormal; Notable for the following components:   D-Dimer, Quant 4.63 (*)    All other components within normal limits  MAGNESIUM - Abnormal; Notable for the following components:   Magnesium 2.7 (*)    All other components within normal limits  PHOSPHORUS - Abnormal; Notable for the following components:   Phosphorus  2.4 (*)    All other components within normal limits  FERRITIN - Abnormal; Notable for the following components:   Ferritin 887 (*)    All other components  within normal limits  D-DIMER, QUANTITATIVE (NOT AT [facility]) - Abnormal; Notable for the following components:   D-Dimer, Quant 3.67 (*)    All other components within normal limits  C-REACTIVE PROTEIN - Abnormal; Notable for the following components:   CRP 9.6 (*)    All other components within normal limits  COMPREHENSIVE METABOLIC PANEL - Abnormal; Notable for the following components:   Sodium 149 (*)    Chloride 115 (*)    Glucose, Bld 139 (*)    BUN 46 (*)    Calcium 8.4 (*)    Total Protein 5.8 (*)    Albumin 2.7 (*)    AST 54 (*)    Alkaline Phosphatase 32 (*)    GFR calc non Af Amer 51 (*)    GFR calc Af Amer 59 (*)    All other components within normal limits  CBC WITH DIFFERENTIAL/PLATELET - Abnormal; Notable for the following components:   MCH 25.7 (*)    MCHC 29.5 (*)    RDW 16.7 (*)    Abs Immature Granulocytes 0.30 (*)    All other components within normal limits  VALPROIC ACID LEVEL - Abnormal; Notable for the following components:   Valproic Acid Lvl 33 (*)    All other components within normal limits  GLUCOSE, CAPILLARY - Abnormal; Notable for the following components:   Glucose-Capillary 131 (*)    All other components within normal limits  I-STAT CHEM 8, ED - Abnormal; Notable for the following components:   Sodium 154 (*)    Chloride 117 (*)    BUN 48 (*)    Creatinine, Ser 1.40 (*)    All other components within normal limits  PROCALCITONIN  FIBRINOGEN  ABO/RH    EKG None  Radiology DG Chest Port 1 View  Result Date: 08/21/2019 CLINICAL DATA:  Failure to thrive.  History of dementia. EXAM: PORTABLE CHEST 1 VIEW COMPARISON:  Single-view of the chest 08/19/2019. CT chest 05/04/2019. FINDINGS: The patient is rotated on the exam. Lungs are clear. Heart size is normal. No pneumothorax or pleural effusion. Surgical clips left axilla noted. No acute or focal bony abnormality. IMPRESSION: No acute disease. Electronically Signed   By: Inge Rise M.D.    On: 08/21/2019 13:59    Procedures Procedures (including critical care time)  Medications Ordered in ED Medications  dextrose 5 % solution (has no administration in time range)  acetaminophen (TYLENOL) tablet 650 mg ( Oral See Alternative 08/22/19 0006)    Or  acetaminophen (TYLENOL) suppository 650 mg (650 mg Rectal Given 08/22/19 0006)  docusate sodium (COLACE) capsule 100 mg (100 mg Oral Not Given 08/21/19 2334)  feeding supplement (ENSURE ENLIVE) (ENSURE ENLIVE) liquid 237 mL (237 mLs Oral Not Given 08/21/19 2334)  ferrous sulfate tablet 325 mg (325 mg Oral Not Given 08/22/19 0749)  levothyroxine (SYNTHROID) tablet 25 mcg (25 mcg Oral Not Given 08/22/19 0547)  mirtazapine (REMERON) tablet 7.5 mg (7.5 mg Oral Not Given 08/21/19 2335)  heparin injection 5,000 Units (5,000 Units Subcutaneous Given 08/22/19 0548)  metoprolol tartrate (LOPRESSOR) injection 2.5 mg (2.5 mg Intravenous Given 08/22/19 0749)  diltiazem (CARDIZEM) injection 10 mg (has no administration in time range)  metoprolol tartrate (LOPRESSOR) injection 2.5 mg (2.5 mg Intravenous Given 08/22/19 0522)    ED Course  I  have reviewed the triage vital signs and the nursing notes.  Pertinent labs & imaging results that were available during my care of the patient were reviewed by me and considered in my medical decision making (see chart for details).    CRITICAL CARE Performed by: Milton Ferguson Total critical care time: 35 minutes Critical care time was exclusive of separately billable procedures and treating other patients. Critical care was necessary to treat or prevent imminent or life-threatening deterioration. Critical care was time spent personally by me on the following activities: development of treatment plan with patient and/or surrogate as well as nursing, discussions with consultants, evaluation of patient's response to treatment, examination of patient, obtaining history from patient or surrogate, ordering  and performing treatments and interventions, ordering and review of laboratory studies, ordering and review of radiographic studies, pulse oximetry and re-evaluation of patient's condition.  MDM Rules/Calculators/A&P                      Patient with severe dehydration.  She will be admitted to medicine Final Clinical Impression(s) / ED Diagnoses Final diagnoses:  None    Rx / DC Orders ED Discharge Orders    None       Milton Ferguson, MD 08/22/19 1042

## 2019-08-22 NOTE — Progress Notes (Signed)
CSW received call from [facility] with DSS stating that a RN from the ED made an APS report for patient as patient returned to the ED yesterday evening with the same hospital gown and medical tape she had on when she presented to the ED on Saturday (showing signs that patient has not been care for since then). Patient is a resident at [facility]. CSW provided more information for the report. Angie reports the report will likely be screened in for follow up.   [place], LCSW Transitions of Care Department [facility] ED [phone]

## 2019-08-22 NOTE — Plan of Care (Signed)
  Problem: Education: Goal: Knowledge of General Education information will improve Description: Including pain rating scale, medication(s)/side effects and non-pharmacologic comfort measures Outcome: Progressing   Problem: Education: Goal: Knowledge of General Education information will improve Description: Including pain rating scale, medication(s)/side effects and non-pharmacologic comfort measures Outcome: Progressing   Problem: Education: Goal: Knowledge of General Education information will improve Description: Including pain rating scale, medication(s)/side effects and non-pharmacologic comfort measures Outcome: Progressing   Problem: Health Behavior/Discharge Planning: Goal: Ability to manage health-related needs will improve Outcome: Progressing

## 2019-08-22 NOTE — Evaluation (Signed)
Physical Therapy One Time Evaluation Patient Details Name: Katie Silva MRN: [id number] DOB: August 08, 1926 Today's Date: 08/22/2019   History of Present Illness  85 year old female with history of advanced dementia, left breast cancer, CVA x3, gastric and duodenal ulcer, H. pylori, HTN, osteoporosis, paroxysmal A. Fib, and humeral fracture presenting with altered mental status and poor p.o. intake.  Recently diagnosed with COVID-19.  Pt admitted for failure to thrive.  Clinical Impression  Patient evaluated by Physical Therapy with no further acute PT needs identified. All education has been completed and the patient has no further questions.  Pt not able to participate and mostly moaning during session.  Pt will likely require increased care upon d/c.  See below for any follow-up Physical Therapy or equipment needs. PT is signing off. Thank you for this referral.  (Also noted PT order now discontinued, and pt made full comfort care this afternoon)     Follow Up Recommendations No PT follow up    Equipment Recommendations  None recommended by PT    Recommendations for Other Services       Precautions / Restrictions Precautions Precautions: Fall      Mobility  Bed Mobility Overal bed mobility: Needs Assistance             General bed mobility comments: pt right sidelying upon entering room, pt stated yes to assist for sitting EOB however not assisting with movement, brought LEs over EOB and provided hands for pt to assist trunk however pt not assisting so did not complete task, O2 [place] around patient's neck on arrival however SPo2 94-95% on room air so removed tubing due to choking hazard, positioned pt to comfort prior to exiting room  (RN made aware of Spo2 as well)  Transfers                    Ambulation/Gait                Stairs            Wheelchair Mobility    Modified Rankin (Stroke Patients Only)       Balance                                              Pertinent Vitals/Pain Pain Assessment: Faces Faces Pain Scale: Hurts even more Pain Location: pt appears in pain and moans however states no to pain question Pain Intervention(s): Repositioned;Monitored during session    Home Living Family/patient expects to be discharged to:: Assisted living                      Prior Function Level of Independence: Needs assistance   Gait / Transfers Assistance Needed: pt poor historian, from [facility] per chart review, likely requires assist           Hand Dominance        Extremity/Trunk Assessment   Upper Extremity Assessment Upper Extremity Assessment: Difficult to assess due to impaired cognition    Lower Extremity Assessment Lower Extremity Assessment: Difficult to assess due to impaired cognition;Generalized weakness       Communication   Communication: (pt mostly moaning and answering yes/no)  Cognition     Overall Cognitive Status: No family/caregiver present to determine baseline cognitive functioning  General Comments: hx of dementia, pt not following commands, mostly moaning      General Comments      Exercises     Assessment/Plan    PT Assessment Patent does not need any further PT services  PT Problem List         PT Treatment Interventions      PT Goals (Current goals can be found in the Care Plan section)  Acute Rehab PT Goals PT Goal Formulation: All assessment and education complete, DC therapy    Frequency     Barriers to discharge        Co-evaluation               AM-PAC PT "6 Clicks" Mobility  Outcome Measure Help needed turning from your back to your side while in a flat bed without using bedrails?: Total Help needed moving from lying on your back to sitting on the side of a flat bed without using bedrails?: Total Help needed moving to and from a bed to a chair (including a  wheelchair)?: Total Help needed standing up from a chair using your arms (e.g., wheelchair or bedside chair)?: Total Help needed to walk in hospital room?: Total Help needed climbing 3-5 steps with a railing? : Total 6 Click Score: 6    End of Session   Activity Tolerance: Other (comment)(limited by cognition) Patient left: in bed;with call bell/phone within reach;with bed alarm set Nurse Communication: Mobility status PT Visit Diagnosis: Adult, failure to thrive (R62.7)    Time: [id number] PT Time Calculation (min) (ACUTE ONLY): 15 min   Charges:   PT Evaluation $PT Eval Low Complexity: 1 Low     Kati PT, DPT Acute Rehabilitation Services Office: [phone]  Trena Platt 08/22/2019, 1:36 PM

## 2019-08-23 DIAGNOSIS — N1831 Chronic kidney disease, stage 3a: Secondary | ICD-10-CM

## 2019-08-23 DIAGNOSIS — F0390 Unspecified dementia without behavioral disturbance: Secondary | ICD-10-CM

## 2019-08-23 DIAGNOSIS — Z789 Other specified health status: Secondary | ICD-10-CM

## 2019-08-23 DIAGNOSIS — Z515 Encounter for palliative care: Secondary | ICD-10-CM

## 2019-08-23 MED ORDER — MORPHINE SULFATE (CONCENTRATE) 10 MG /0.5 ML PO SOLN: 10.0000 mg | ORAL | 0 refills | Status: AC | PRN

## 2019-08-23 MED ORDER — GLYCOPYRROLATE 1 MG PO TABS: 1.0000 mg | ORAL_TABLET | Freq: Three times a day (TID) | ORAL | 0 refills | Status: AC | PRN

## 2019-08-23 MED ORDER — ONDANSETRON 4 MG PO TBDP: 4.0000 mg | ORAL_TABLET | Freq: Four times a day (QID) | ORAL | 0 refills | Status: AC | PRN

## 2019-08-23 MED ORDER — LORAZEPAM 1 MG PO TABS: 1.0000 mg | ORAL_TABLET | ORAL | 0 refills | Status: AC | PRN

## 2019-08-23 MED ORDER — HALOPERIDOL 0.5 MG PO TABS: 0.5000 mg | ORAL_TABLET | ORAL | 0 refills | Status: AC | PRN

## 2019-08-23 NOTE — Progress Notes (Signed)
Nutrition Brief Note RD working remotely.   Chart reviewed. Patient screened for MST score. She is COVID-19 positive and is now transitioning to comfort care with plan for discharge to residential hospice; facility is full with no beds on the COVID unit at this time so patient placed on wait list.   No further nutrition interventions warranted at this time.  Please re-consult as needed.     Jarome Matin, MS, RD, LDN, [facility] Inpatient Clinical Dietitian Pager # [phone] After hours/weekend pager # [phone]

## 2019-08-23 NOTE — Progress Notes (Signed)
Called Katie Silva, daughter, with updates and plan of care.

## 2019-08-23 NOTE — Progress Notes (Signed)
[facility]  Discussed with pt's daughter the hospice services and philosophy. She is in agreement and does accept a bed at the [facility]. Unfortunately We are full and do not have a bed on COVID unit due to capacity.  We will follow up tomorrow and update SW on bed availability. She is next on list for a COVID bed. Webb Silversmith RN [phone]

## 2019-08-23 NOTE — Discharge Summary (Signed)
Physician Discharge Summary  Katie Silva [id number] DOB: 02-Jun-1926 DOA: 08/21/2019  PCP: Katie Brothers, MD  Admit date: 08/21/2019 Discharge date: 08/23/2019  Admitted From: SNF Disposition: Residential hospice  Recommendations for Outpatient Follow-up:  1. Follow ups as below. 2. Please obtain CBC/BMP/Mag at follow up 3. Please follow up on the following pending results: None  Discharge Condition: Guarded prognosis CODE STATUS: DNR/DNI  Hospital Course: 85 year old female with history of advanced dementia, left breast cancer, CVA x3, gastric and duodenal ulcer, H. pylori, HTN, osteoporosis, paroxysmal A. Fib, and humeral fracture presenting with altered mental status and poor p.o. intake.  Recently diagnosed with COVID-19.   Of note, patient had ED visit on 12/9 and 12/12 for emesis and fall respectively.  UA obtained on 12/9 concerning for UTI.  She was discharged on Keflex.  Urine culture grew ESBL E. coli.  In ED, hemodynamically stable and saturating at 95% on RA.  Hypernatremic to 155.  CBC within normal.  Creatinine 1.37.  BUN 56.  AST 56.  UA with 5 ketones and rare bacteria.  Admitted for acute metabolic encephalopathy, hypernatremia and failure to thrive.  Started on IV dextrose with improvement in hypernatremia.  However, patient remained encephalopathic.   After extensive discussion about patient's condition and prognosis with patient's daughter, Katie Silva POA, patient was transitioned to full comfort care on 08/22/2019 at about 13:05 p.m.   CSW consulted on 08/23/2019 for residential hospice placement  Discharge Diagnoses:  Acute metabolic encephalopathy in patient with advanced dementia due to COVID-19 infection, hypernatremia and azotemia. She remains encephalopathic but without distress. No apparent focal neuro deficit.  Dehydration/hypovolemic hypernatremia due to poor p.o. intake and patient with advanced dementia.  She has prerenal azotemia/AKI  and ketonuria suggestive for dehydration.  COVID-19 infection: No apparent respiratory distress.  Saturating at 96% on room air.  Inflammatory markers elevated. Unclear how long she had COVID-19  Acute kidney injury/azotemia: Likely prerenal from poor p.o. intake:  Essential hypertension:   A. fib with intermittent RVR: At home, on metoprolol for rate control.  Not on anticoagulation due to risk of fall.  History of CVA: No apparent focal neuro deficits other than altered mental status which limits her neuro exam.  Does not seem to be on any medication.   ESBL E. coli bacteriuria vs UTI: Urine culture on 12/9 with ESBL E. Coli.  Likely colonization per infectious disease.  ID recommended not treating.  Urinalysis on this admission without significant finding.  History of seizure?  Not listed on her history.  Noted Depakote on her home medication  Hypothyroidism:  On Synthroid at home.  DNR/DNI  Pressure Injury 08/21/19 Heel Right;Left Stage I -  Intact skin with non-blanchable redness of a localized area usually over a bony prominence. redden area blanchable (Active)  08/21/19 1857  Location: Heel  Location Orientation: Right;Left  Staging: Stage I -  Intact skin with non-blanchable redness of a localized area usually over a bony prominence.  Wound Description (Comments): redden area blanchable  Present on Admission: Yes     Pressure Injury 08/21/19 Sacrum Mid Stage II -  Partial thickness loss of dermis presenting as a shallow open ulcer with a red, pink wound bed without slough. redden and excorated (Active)  08/21/19 1857  Location: Sacrum  Location Orientation: Mid  Staging: Stage II -  Partial thickness loss of dermis presenting as a shallow open ulcer with a red, pink wound bed without slough.  Wound Description (Comments): redden and  excorated  Present on Admission: Yes   Severe malnutrition: BMI 21.29.  Significant weight loss and muscle mass loss.    Wt  Readings from Last 10 Encounters:  08/21/19 51.1 kg  05/04/19 61.3 kg  06/13/18 59.9 kg  05/02/18 110.6 kg  11/29/17 62.8 kg  09/13/17 60.9 kg  07/01/17 59.6 kg  05/31/17 60.6 kg  12/03/16 61.7 kg  12/31/15 64.2 kg  -Consult SLP and dietitian.   Goals of care:  Comfort measures only.  Plan for transfer to residential hospice.  08/22/2019-Extensive discussion with patient's daughter, Katie Silva, [place] about patient's condition as above.  Confirmed DNR/DNI status.  At baseline patient could only feed and dress herself but has been in decline since she tested positive for Covid about 10 days ago. She has been isolated which made it hard for her.  Patient's daughter noted significant decline in her mental status and physical condition.  She was not even able to recall her when she went to visit at facility.  We discussed about patient's prognosis in light of her current illness, underlying comorbidity which I believe is very poor, may be days at maximum.  Patient's daughter understandably stated, "my mother is ready to go and be with God.  I am sure she does not want to live like this.  I do not want her life to be prolonged. Please make her comfortable for the time she have left".  I have discussed about comfort measures only care which emphasizes alleviation of pain and suffering. Katie Silva voiced understanding and appreciated the communication and honest discussion. She asked about visitation. I told her we will clarify and let her know. Comfort measures initiated at 13:05 p.m.      Discharge Instructions   Allergies as of 08/23/2019      Reactions   Fexofenadine Hcl Other (See Comments)   Doesn't agree with patient   Aricept [donepezil Hcl] Other (See Comments)   nightmares   Ativan [lorazepam] Other (See Comments)   On MAR   Penicillins Other (See Comments)   Tolerated Augmentin 12/20. Pt does not remember reaction to PCN ("did not work") Did it involve swelling of the  face/tongue/throat, SOB, or low BP? Unknown Did it involve sudden or severe rash/hives, skin peeling, or any reaction on the inside of your mouth or nose? Unknown Did you need to seek medical attention at a hospital or doctor's office? Unknown When did it last happen? If all above answers are "NO", may proceed with cephalosporin use.   Tylenol [acetaminophen] Other (See Comments)   "funny feeling in head."      Medication List    STOP taking these medications   acetaminophen 325 MG tablet Commonly known as: TYLENOL   dimethicone-zinc oxide cream   divalproex 125 MG DR tablet Commonly known as: DEPAKOTE   docusate sodium 100 MG capsule Commonly known as: COLACE   Ensure   ferrous sulfate 325 (65 FE) MG tablet   levothyroxine 25 MCG tablet Commonly known as: SYNTHROID   lisinopril 20 MG tablet Commonly known as: ZESTRIL   metoprolol tartrate 25 MG tablet Commonly known as: LOPRESSOR   mirtazapine 7.5 MG tablet Commonly known as: REMERON   multivitamin with minerals Tabs tablet   OSTEO BI-FLEX JOINT SHIELD PO     TAKE these medications   glycopyrrolate 1 MG tablet Commonly known as: ROBINUL Take 1 tablet (1 mg total) by mouth 3 (three) times daily as needed (excessive secretions).   haloperidol 0.5 MG tablet  Commonly known as: HALDOL Take 1 tablet (0.5 mg total) by mouth every 4 (four) hours as needed for agitation (or delirium).   LORazepam 1 MG tablet Commonly known as: ATIVAN Take 1 tablet (1 mg total) by mouth every 4 (four) hours as needed for anxiety.   morphine CONCENTRATE 10 mg / 0.5 ml concentrated solution Take 0.5 mLs (10 mg total) by mouth every 3 (three) hours as needed for moderate pain, severe pain or shortness of breath.   ondansetron 4 MG disintegrating tablet Commonly known as: ZOFRAN-ODT Take 1 tablet (4 mg total) by mouth every 6 (six) hours as needed for nausea.       Consultations:  Infectious  disease  Procedures/Studies:  2D Echo: None   DG Chest 2 View  Result Date: 08/16/2019 CLINICAL DATA:  Hematemesis.  Possible mediastinal air. EXAM: CHEST - 2 VIEW COMPARISON:  05/01/2019 FINDINGS: Patient is rotated to the right. Lungs are adequately inflated without focal airspace consolidation. Possible small amount of posterior right pleural fluid. No evidence of mediastinal air. Borderline cardiomegaly unchanged. Calcification over the mitral valve annulus. Remainder of the exam is unchanged. IMPRESSION: Possible small amount of posterior right pleural fluid, otherwise no acute cardiopulmonary disease. No evidence of mediastinal air. Mild stable cardiomegaly. Electronically Signed   By: Marin Olp M.D.   On: 08/16/2019 12:31   CT Head Wo Contrast  Result Date: 08/19/2019 CLINICAL DATA:  Pt arrived by EMS from [facility] after a witnessed fall out of bed. Staff reports patient hit head; no obvious injury. No loss of consciousness EXAM: CT HEAD WITHOUT CONTRAST CT CERVICAL SPINE WITHOUT CONTRAST TECHNIQUE: Multidetector CT imaging of the head and cervical spine was performed following the standard protocol without intravenous contrast. Multiplanar CT image reconstructions of the cervical spine were also generated. COMPARISON:  10/09/2018. FINDINGS: CT HEAD FINDINGS Brain: No evidence of acute infarction, hemorrhage, hydrocephalus, extra-axial collection or mass lesion/mass effect. There is ventricular sulcal enlargement reflecting to moderate atrophy. There is periventricular white matter hypoattenuation consistent with mild chronic microvascular ischemic change. Vascular: No hyperdense vessel or unexpected calcification. Skull: Normal. Negative for fracture or focal lesion. Sinuses/Orbits: Globes and orbits are unremarkable. Visualized sinuses and mastoid air cells are clear. Other: None. CT CERVICAL SPINE FINDINGS Alignment: Slight anterolisthesis of C4 on C5 and C5 on C6, degenerative in  origin. Mild curvature, convex to the right. Skull base and vertebrae: No acute fracture. No primary bone lesion or focal pathologic process. Soft tissues and spinal canal: No prevertebral fluid or swelling. No visible canal hematoma. Disc levels: Moderate loss of disc height at C3-C4-C5-C6 and C6-C7. Mild spondylotic disc bulging with endplate spurring. Facet degenerative changes, most evident on the left at C2-C3. No convincing disc herniation. Upper chest: No acute findings. Other: None. IMPRESSION: HEAD CT 1. No acute intracranial abnormalities. 2. Atrophy and mild chronic microvascular ischemic change. CERVICAL CT 1. No fracture or acute finding. Electronically Signed   By: Lajean Manes M.D.   On: 08/19/2019 18:32   CT Cervical Spine Wo Contrast  Result Date: 08/19/2019 CLINICAL DATA:  Pt arrived by EMS from [facility] after a witnessed fall out of bed. Staff reports patient hit head; no obvious injury. No loss of consciousness EXAM: CT HEAD WITHOUT CONTRAST CT CERVICAL SPINE WITHOUT CONTRAST TECHNIQUE: Multidetector CT imaging of the head and cervical spine was performed following the standard protocol without intravenous contrast. Multiplanar CT image reconstructions of the cervical spine were also generated. COMPARISON:  10/09/2018. FINDINGS: CT  HEAD FINDINGS Brain: No evidence of acute infarction, hemorrhage, hydrocephalus, extra-axial collection or mass lesion/mass effect. There is ventricular sulcal enlargement reflecting to moderate atrophy. There is periventricular white matter hypoattenuation consistent with mild chronic microvascular ischemic change. Vascular: No hyperdense vessel or unexpected calcification. Skull: Normal. Negative for fracture or focal lesion. Sinuses/Orbits: Globes and orbits are unremarkable. Visualized sinuses and mastoid air cells are clear. Other: None. CT CERVICAL SPINE FINDINGS Alignment: Slight anterolisthesis of C4 on C5 and C5 on C6, degenerative in origin. Mild  curvature, convex to the right. Skull base and vertebrae: No acute fracture. No primary bone lesion or focal pathologic process. Soft tissues and spinal canal: No prevertebral fluid or swelling. No visible canal hematoma. Disc levels: Moderate loss of disc height at C3-C4-C5-C6 and C6-C7. Mild spondylotic disc bulging with endplate spurring. Facet degenerative changes, most evident on the left at C2-C3. No convincing disc herniation. Upper chest: No acute findings. Other: None. IMPRESSION: HEAD CT 1. No acute intracranial abnormalities. 2. Atrophy and mild chronic microvascular ischemic change. CERVICAL CT 1. No fracture or acute finding. Electronically Signed   By: Lajean Manes M.D.   On: 08/19/2019 18:32   CT ABDOMEN PELVIS W CONTRAST  Result Date: 08/16/2019 CLINICAL DATA:  Nausea, vomiting. EXAM: CT ABDOMEN AND PELVIS WITH CONTRAST TECHNIQUE: Multidetector CT imaging of the abdomen and pelvis was performed using the standard protocol following bolus administration of intravenous contrast. CONTRAST:  59mL OMNIPAQUE IOHEXOL 300 MG/ML  SOLN COMPARISON:  None. FINDINGS: Lower chest: Minimal right pleural effusion is noted with adjacent subsegmental atelectasis. Hepatobiliary: No focal liver abnormality is seen. No gallstones, gallbladder wall thickening, or biliary dilatation. Pancreas: Unremarkable. No pancreatic ductal dilatation or surrounding inflammatory changes. Spleen: Normal in size without focal abnormality. Adrenals/Urinary Tract: Adrenal glands appear normal. Right renal cyst is noted. No hydronephrosis or renal obstruction is noted. No renal or ureteral calculi are noted. Urinary bladder is unremarkable. Stomach/Bowel: The stomach appears normal. There is no evidence of bowel obstruction or inflammation. Stool seen throughout the colon, including large amount of stool in the rectum. The appendix is not visualized. Vascular/Lymphatic: Aortic atherosclerosis. No enlarged abdominal or pelvic lymph  nodes. Reproductive: Status post hysterectomy. No adnexal masses. Other: No abdominal wall hernia or abnormality. No abdominopelvic ascites. Musculoskeletal: No acute or significant osseous findings. IMPRESSION: 1. Minimal right pleural effusion with adjacent subsegmental atelectasis. 2. Aortic atherosclerosis. 3. Stool seen throughout the colon, including large amount of stool in the rectum. 4. No other significant abnormality seen in the abdomen or pelvis. Aortic Atherosclerosis (ICD10-I70.0). Electronically Signed   By: Marijo Conception M.D.   On: 08/16/2019 13:48   DG Chest Port 1 View  Result Date: 08/21/2019 CLINICAL DATA:  Failure to thrive.  History of dementia. EXAM: PORTABLE CHEST 1 VIEW COMPARISON:  Single-view of the chest 08/19/2019. CT chest 05/04/2019. FINDINGS: The patient is rotated on the exam. Lungs are clear. Heart size is normal. No pneumothorax or pleural effusion. Surgical clips left axilla noted. No acute or focal bony abnormality. IMPRESSION: No acute disease. Electronically Signed   By: Inge Rise M.D.   On: 08/21/2019 13:59   DG Chest Port 1 View  Result Date: 08/19/2019 CLINICAL DATA:  Unwitnessed fall EXAM: PORTABLE CHEST 1 VIEW COMPARISON:  08/16/2019 FINDINGS: Heart size is mildly enlarged, unchanged. No focal consolidation, pleural effusion, or pneumothorax. IMPRESSION: No active disease. Electronically Signed   By: Davina Poke M.D.   On: 08/19/2019 18:27      Discharge  Exam: Vitals:   08/22/19 2207 08/23/19 0032  BP:  111/63  Pulse: 65 84  Resp: (!) 26 18  Temp:  (!) 97.4 F (36.3 C)  SpO2: 95% 90%    GENERAL: No apparent distress.  Frail RESP:  No IWOB. CVS:  RRR. Heart sounds normal.  NEURO: Sleepy   The results of significant diagnostics from this hospitalization (including imaging, microbiology, ancillary and laboratory) are listed below for reference.     Microbiology: Recent Results (from the past 240 hour(s))  Urine culture      Status: Abnormal   Collection Time: 08/16/19  2:17 PM   Specimen: Urine, Catheterized  Result Value Ref Range Status   Specimen Description   Final    URINE, CATHETERIZED Performed at [facility] [address], [place], [place] [address]    Special Requests   Final    Normal Performed at [facility], [place] [address], [place], [place] [address]    Culture (A)  Final    >=100,000 COLONIES/mL ESCHERICHIA COLI Confirmed Extended Spectrum Beta-Lactamase Producer (ESBL).  In bloodstream infections from ESBL organisms, carbapenems are preferred over piperacillin/tazobactam. They are shown to have a lower risk of mortality.    Report Status 08/21/2019 FINAL  Final   Organism ID, Bacteria ESCHERICHIA COLI (A)  Final      Susceptibility   Escherichia coli - MIC*    AMPICILLIN >=32 RESISTANT Resistant     CEFAZOLIN >=64 RESISTANT Resistant     CEFTRIAXONE RESISTANT Resistant     CIPROFLOXACIN >=4 RESISTANT Resistant     GENTAMICIN <=1 SENSITIVE Sensitive     IMIPENEM <=0.25 SENSITIVE Sensitive     NITROFURANTOIN <=16 SENSITIVE Sensitive     TRIMETH/SULFA >=320 RESISTANT Resistant     AMPICILLIN/SULBACTAM 4 SENSITIVE Sensitive     PIP/TAZO <=4 SENSITIVE Sensitive     * >=100,000 COLONIES/mL ESCHERICHIA COLI  SARS CORONAVIRUS 2 (TAT 6-24 HRS) Nasopharyngeal Nasopharyngeal Swab     Status: Abnormal   Collection Time: 08/21/19  4:08 PM   Specimen: Nasopharyngeal Swab  Result Value Ref Range Status   SARS Coronavirus 2 POSITIVE (A) NEGATIVE Final    Comment: RESULT CALLED TO, READ BACK BY AND VERIFIED WITH: RN ALEXIS FRAZIER AT 0255 BY MESSAN HOUEGNIFIO ON 08/22/2019 (NOTE) SARS-CoV-2 target nucleic acids are DETECTED. The SARS-CoV-2 RNA is generally detectable in upper and lower respiratory specimens during the acute phase of infection. Positive results are indicative of the presence of SARS-CoV-2 RNA. Clinical correlation with patient history and  other diagnostic information is  necessary to determine patient infection status. Positive results do not rule out bacterial infection or co-infection with other viruses.  The expected result is Negative. Fact Sheet for Patients: SugarRoll.be Fact Sheet for Healthcare Providers: https://www.woods-mathews.com/ This test is not yet approved or cleared by the [place] FDA and  has been authorized for detection and/or diagnosis of SARS-CoV-2 by FDA under an Emergency Use Authorization (EUA). This EUA will remain  in effect (meaning this  test can be used) for the duration of the COVID-19 declaration under Section 564(b)(1) of the Act, 21 U.S.C. section 360bbb-3(b)(1), unless the authorization is terminated or revoked sooner. Performed at [facility], [place] [address] [place], [place] [address]      Labs: BNP (last 3 results) Recent Labs    08/21/19 1957  BNP 0000000*   Basic Metabolic Panel: Recent Labs  Lab 08/19/19 1641 08/21/19 1342 08/21/19 1407 08/22/19 0335  NA  148* 155* 154* 149*  K 4.5 4.4 4.0 4.0  CL 110 116* 117* 115*  CO2 24 26  --  23  GLUCOSE 137* 100* 93 139*  BUN 34* 56* 48* 46*  CREATININE 0.96 1.37* 1.40* 0.97  CALCIUM 8.5* 8.8*  --  8.4*  MG  --   --   --  2.7*  PHOS  --   --   --  2.4*   Liver Function Tests: Recent Labs  Lab 08/21/19 1342 08/22/19 0335  AST 56* 54*  ALT 29 29  ALKPHOS 35* 32*  BILITOT 0.6 0.6  PROT 6.3* 5.8*  ALBUMIN 2.9* 2.7*   No results for input(s): LIPASE, AMYLASE in the last 168 hours. No results for input(s): AMMONIA in the last 168 hours. CBC: Recent Labs  Lab 08/19/19 1641 08/21/19 1342 08/21/19 1407 08/22/19 0335  WBC 9.1 9.7  --  10.3  NEUTROABS 4.8 6.5  --  7.2  HGB 14.9 13.5 13.9 12.6  HCT 50.2* 45.3 41.0 42.7  MCV 87.3 86.5  --  87.0  PLT 224 226  --  229   Cardiac Enzymes: No results for input(s): CKTOTAL, CKMB, CKMBINDEX, TROPONINI in the last 168  hours. BNP: Invalid input(s): POCBNP CBG: Recent Labs  Lab 08/22/19 0905  GLUCAP 131*   D-Dimer Recent Labs    08/21/19 1957 08/22/19 0335  DDIMER 4.63* 3.67*   Hgb A1c No results for input(s): HGBA1C in the last 72 hours. Lipid Profile No results for input(s): CHOL, HDL, LDLCALC, TRIG, CHOLHDL, LDLDIRECT in the last 72 hours. Thyroid function studies No results for input(s): TSH, T4TOTAL, T3FREE, THYROIDAB in the last 72 hours.  Invalid input(s): FREET3 Anemia work up Recent Labs    08/21/19 1957 08/22/19 0335  FERRITIN 806* 887*   Urinalysis    Component Value Date/Time   COLORURINE AMBER (A) 08/21/2019 1608   APPEARANCEUR HAZY (A) 08/21/2019 1608   LABSPEC 1.028 08/21/2019 1608   PHURINE 5.0 08/21/2019 1608   GLUCOSEU NEGATIVE 08/21/2019 1608   HGBUR NEGATIVE 08/21/2019 1608   BILIRUBINUR NEGATIVE 08/21/2019 1608   BILIRUBINUR Negative 11/29/2017 1605   KETONESUR 5 (A) 08/21/2019 1608   PROTEINUR 30 (A) 08/21/2019 1608   UROBILINOGEN 0.2 11/29/2017 1605   UROBILINOGEN 0.2 10/08/2009 1326   NITRITE NEGATIVE 08/21/2019 1608   LEUKOCYTESUR NEGATIVE 08/21/2019 1608   Sepsis Labs Invalid input(s): PROCALCITONIN,  WBC,  LACTICIDVEN   Time coordinating discharge: 35 minutes  SIGNED:  Mercy Riding, MD  [name] 08/23/2019, 1:35 PM  If 7PM-7AM, please contact night-coverage [email or web address] Password [email or web address]

## 2019-08-23 NOTE — TOC Progression Note (Signed)
Transition of Care [facility]) - Progression Note    Patient Details  Name: Katie Silva MRN: [id number] Date of Birth: 20-Jul-1926  [facility] of [facility]) CM/SW Contact  Purcell Mouton, RN Phone Number: 08/23/2019, 2:41 PM  Clinical Narrative:    There are no Hospice bed available at residential hospice today will follow up in the am.        Expected Discharge Plan and Services                                                 Social Determinants of Health (SDOH) Interventions    Readmission Risk Interventions No flowsheet data found.

## 2019-08-23 NOTE — TOC Progression Note (Signed)
Transition of Care [facility]) - Progression Note    Patient Details  Name: Katie Silva MRN: [id number] Date of Birth: 04/24/1926  Transition of Care [facility]) CM/SW Contact  Purcell Mouton, RN Phone Number: 08/23/2019, 1:01 PM  Clinical Narrative:     Spoke with pt's daughter [name] [phone] concerning Residential Hospice Home. Referral called to [facility].        Expected Discharge Plan and Services                                                 Social Determinants of Health (SDOH) Interventions    Readmission Risk Interventions No flowsheet data found.

## 2019-08-24 NOTE — Discharge Summary (Signed)
Physician Discharge Summary  Katie Silva [id number] DOB: 08/29/1926 DOA: 08/21/2019  PCP: Sande Brothers, MD  Admit date: 08/21/2019 Discharge date: 08/24/2019  Admitted From: SNF Disposition: Residential hospice  Recommendations for Outpatient Follow-up:  1. Follow ups as below. 2. Please obtain CBC/BMP/Mag at follow up 3. Please follow up on the following pending results: None  Discharge Condition: Guarded prognosis CODE STATUS: DNR/DNI  Hospital Course: 85 year old female with history of advanced dementia, left breast cancer, CVA x3, gastric and duodenal ulcer, H. pylori, HTN, osteoporosis, paroxysmal A. Fib, and humeral fracture presenting with altered mental status and poor p.o. intake.  Recently diagnosed with COVID-19.   Of note, patient had ED visit on 12/9 and 12/12 for emesis and fall respectively.  UA obtained on 12/9 concerning for UTI.  She was discharged on Keflex.  Urine culture grew ESBL E. coli.  In ED, hemodynamically stable and saturating at 95% on RA.  Hypernatremic to 155.  CBC within normal.  Creatinine 1.37.  BUN 56.  AST 56.  UA with 5 ketones and rare bacteria.  Admitted for acute metabolic encephalopathy, hypernatremia and failure to thrive.  Started on IV dextrose with improvement in hypernatremia.  However, patient remained encephalopathic.   After extensive discussion about patient's condition and prognosis with patient's daughter, [name] [facility], patient was transitioned to full comfort care on 08/22/2019 at about 13:05 p.m.   CSW consulted on 08/23/2019 for residential hospice placement  Discharge Diagnoses:  Acute metabolic encephalopathy in patient with advanced dementia due to COVID-19 infection, hypernatremia and azotemia. She remains encephalopathic but without distress. No apparent focal neuro deficit.  Dehydration/hypovolemic hypernatremia due to poor p.o. intake and patient with advanced dementia.  She has prerenal azotemia/AKI  and ketonuria suggestive for dehydration.  COVID-19 infection: No apparent respiratory distress.  Saturating at 96% on room air.  Inflammatory markers elevated. Unclear how long she had COVID-19  Acute kidney injury/azotemia: Likely prerenal from poor p.o. intake:  Essential hypertension:   A. fib with intermittent RVR: At home, on metoprolol for rate control.  Not on anticoagulation due to risk of fall.  History of CVA: No apparent focal neuro deficits other than altered mental status which limits her neuro exam.  Does not seem to be on any medication.   ESBL E. coli bacteriuria vs UTI: Urine culture on 12/9 with ESBL E. Coli.  Likely colonization per infectious disease.  ID recommended not treating.  Urinalysis on this admission without significant finding.  History of seizure?  Not listed on her history.  Noted Depakote on her home medication  Hypothyroidism:  On Synthroid at home.  DNR/DNI  Pressure Injury 08/21/19 Heel Right;Left Stage I -  Intact skin with non-blanchable redness of a localized area usually over a bony prominence. redden area blanchable (Active)  08/21/19 1857  Location: Heel  Location Orientation: Right;Left  Staging: Stage I -  Intact skin with non-blanchable redness of a localized area usually over a bony prominence.  Wound Description (Comments): redden area blanchable  Present on Admission: Yes     Pressure Injury 08/21/19 Sacrum Mid Stage II -  Partial thickness loss of dermis presenting as a shallow open ulcer with a red, pink wound bed without slough. redden and excorated (Active)  08/21/19 1857  Location: Sacrum  Location Orientation: Mid  Staging: Stage II -  Partial thickness loss of dermis presenting as a shallow open ulcer with a red, pink wound bed without slough.  Wound Description (Comments): redden and  excorated  Present on Admission: Yes   Severe malnutrition: BMI 21.29.  Significant weight loss and muscle mass loss.    Wt  Readings from Last 10 Encounters:  08/21/19 51.1 kg  05/04/19 61.3 kg  06/13/18 59.9 kg  05/02/18 110.6 kg  11/29/17 62.8 kg  09/13/17 60.9 kg  07/01/17 59.6 kg  05/31/17 60.6 kg  12/03/16 61.7 kg  12/31/15 64.2 kg  -Consult SLP and dietitian.   Goals of care:  Comfort measures only.  Plan for transfer to residential hospice.  08/22/2019-Extensive discussion with patient's daughter, [name], [place] about patient's condition as above.  Confirmed DNR/DNI status.  At baseline patient could only feed and dress herself but has been in decline since she tested positive for Covid about 10 days ago. She has been isolated which made it hard for her.  Patient's daughter noted significant decline in her mental status and physical condition.  She was not even able to recall her when she went to visit at facility.  We discussed about patient's prognosis in light of her current illness, underlying comorbidity which I believe is very poor, may be days at maximum.  Patient's daughter understandably stated, "my mother is ready to go and be with God.  I am sure she does not want to live like this.  I do not want her life to be prolonged. Please make her comfortable for the time she have left".  I have discussed about comfort measures only care which emphasizes alleviation of pain and suffering. [name] voiced understanding and appreciated the communication and honest discussion. She asked about visitation. I told her we will clarify and let her know. Comfort measures initiated at 13:05 p.m on 08/22/2019.  Transfered to residential hospice in [place] on 08/24/2019.     Discharge Instructions   Allergies as of 08/24/2019      Reactions   Fexofenadine Hcl Other (See Comments)   Doesn't agree with patient   Aricept [donepezil Hcl] Other (See Comments)   nightmares   Ativan [lorazepam] Other (See Comments)   On MAR   Penicillins Other (See Comments)   Tolerated Augmentin 12/20. Pt does not remember  reaction to PCN ("did not work") Did it involve swelling of the face/tongue/throat, SOB, or low BP? Unknown Did it involve sudden or severe rash/hives, skin peeling, or any reaction on the inside of your mouth or nose? Unknown Did you need to seek medical attention at a hospital or doctor's office? Unknown When did it last happen? If all above answers are "NO", may proceed with cephalosporin use.   Tylenol [acetaminophen] Other (See Comments)   "funny feeling in head."      Medication List    STOP taking these medications   acetaminophen 325 MG tablet Commonly known as: TYLENOL   dimethicone-zinc oxide cream   divalproex 125 MG DR tablet Commonly known as: DEPAKOTE   docusate sodium 100 MG capsule Commonly known as: COLACE   Ensure   ferrous sulfate 325 (65 FE) MG tablet   levothyroxine 25 MCG tablet Commonly known as: SYNTHROID   lisinopril 20 MG tablet Commonly known as: ZESTRIL   metoprolol tartrate 25 MG tablet Commonly known as: LOPRESSOR   mirtazapine 7.5 MG tablet Commonly known as: REMERON   multivitamin with minerals Tabs tablet   OSTEO BI-FLEX JOINT SHIELD PO     TAKE these medications   glycopyrrolate 1 MG tablet Commonly known as: ROBINUL Take 1 tablet (1 mg total) by mouth 3 (three) times daily  as needed (excessive secretions).   haloperidol 0.5 MG tablet Commonly known as: HALDOL Take 1 tablet (0.5 mg total) by mouth every 4 (four) hours as needed for agitation (or delirium).   LORazepam 1 MG tablet Commonly known as: ATIVAN Take 1 tablet (1 mg total) by mouth every 4 (four) hours as needed for anxiety.   morphine CONCENTRATE 10 mg / 0.5 ml concentrated solution Take 0.5 mLs (10 mg total) by mouth every 3 (three) hours as needed for moderate pain, severe pain or shortness of breath.   ondansetron 4 MG disintegrating tablet Commonly known as: ZOFRAN-ODT Take 1 tablet (4 mg total) by mouth every 6 (six) hours as needed for nausea.         Consultations:  Infectious disease  Procedures/Studies:  2D Echo: None   DG Chest 2 View  Result Date: 08/16/2019 CLINICAL DATA:  Hematemesis.  Possible mediastinal air. EXAM: CHEST - 2 VIEW COMPARISON:  05/01/2019 FINDINGS: Patient is rotated to the right. Lungs are adequately inflated without focal airspace consolidation. Possible small amount of posterior right pleural fluid. No evidence of mediastinal air. Borderline cardiomegaly unchanged. Calcification over the mitral valve annulus. Remainder of the exam is unchanged. IMPRESSION: Possible small amount of posterior right pleural fluid, otherwise no acute cardiopulmonary disease. No evidence of mediastinal air. Mild stable cardiomegaly. Electronically Signed   By: Marin Olp M.D.   On: 08/16/2019 12:31   CT Head Wo Contrast  Result Date: 08/19/2019 CLINICAL DATA:  Pt arrived by EMS from [facility] after a witnessed fall out of bed. Staff reports patient hit head; no obvious injury. No loss of consciousness EXAM: CT HEAD WITHOUT CONTRAST CT CERVICAL SPINE WITHOUT CONTRAST TECHNIQUE: Multidetector CT imaging of the head and cervical spine was performed following the standard protocol without intravenous contrast. Multiplanar CT image reconstructions of the cervical spine were also generated. COMPARISON:  10/09/2018. FINDINGS: CT HEAD FINDINGS Brain: No evidence of acute infarction, hemorrhage, hydrocephalus, extra-axial collection or mass lesion/mass effect. There is ventricular sulcal enlargement reflecting to moderate atrophy. There is periventricular white matter hypoattenuation consistent with mild chronic microvascular ischemic change. Vascular: No hyperdense vessel or unexpected calcification. Skull: Normal. Negative for fracture or focal lesion. Sinuses/Orbits: Globes and orbits are unremarkable. Visualized sinuses and mastoid air cells are clear. Other: None. CT CERVICAL SPINE FINDINGS Alignment: Slight anterolisthesis of C4 on  C5 and C5 on C6, degenerative in origin. Mild curvature, convex to the right. Skull base and vertebrae: No acute fracture. No primary bone lesion or focal pathologic process. Soft tissues and spinal canal: No prevertebral fluid or swelling. No visible canal hematoma. Disc levels: Moderate loss of disc height at C3-C4-C5-C6 and C6-C7. Mild spondylotic disc bulging with endplate spurring. Facet degenerative changes, most evident on the left at C2-C3. No convincing disc herniation. Upper chest: No acute findings. Other: None. IMPRESSION: HEAD CT 1. No acute intracranial abnormalities. 2. Atrophy and mild chronic microvascular ischemic change. CERVICAL CT 1. No fracture or acute finding. Electronically Signed   By: Lajean Manes M.D.   On: 08/19/2019 18:32   CT Cervical Spine Wo Contrast  Result Date: 08/19/2019 CLINICAL DATA:  Pt arrived by EMS from [facility] after a witnessed fall out of bed. Staff reports patient hit head; no obvious injury. No loss of consciousness EXAM: CT HEAD WITHOUT CONTRAST CT CERVICAL SPINE WITHOUT CONTRAST TECHNIQUE: Multidetector CT imaging of the head and cervical spine was performed following the standard protocol without intravenous contrast. Multiplanar CT image reconstructions of  the cervical spine were also generated. COMPARISON:  10/09/2018. FINDINGS: CT HEAD FINDINGS Brain: No evidence of acute infarction, hemorrhage, hydrocephalus, extra-axial collection or mass lesion/mass effect. There is ventricular sulcal enlargement reflecting to moderate atrophy. There is periventricular white matter hypoattenuation consistent with mild chronic microvascular ischemic change. Vascular: No hyperdense vessel or unexpected calcification. Skull: Normal. Negative for fracture or focal lesion. Sinuses/Orbits: Globes and orbits are unremarkable. Visualized sinuses and mastoid air cells are clear. Other: None. CT CERVICAL SPINE FINDINGS Alignment: Slight anterolisthesis of C4 on C5 and C5 on  C6, degenerative in origin. Mild curvature, convex to the right. Skull base and vertebrae: No acute fracture. No primary bone lesion or focal pathologic process. Soft tissues and spinal canal: No prevertebral fluid or swelling. No visible canal hematoma. Disc levels: Moderate loss of disc height at C3-C4-C5-C6 and C6-C7. Mild spondylotic disc bulging with endplate spurring. Facet degenerative changes, most evident on the left at C2-C3. No convincing disc herniation. Upper chest: No acute findings. Other: None. IMPRESSION: HEAD CT 1. No acute intracranial abnormalities. 2. Atrophy and mild chronic microvascular ischemic change. CERVICAL CT 1. No fracture or acute finding. Electronically Signed   By: Lajean Manes M.D.   On: 08/19/2019 18:32   CT ABDOMEN PELVIS W CONTRAST  Result Date: 08/16/2019 CLINICAL DATA:  Nausea, vomiting. EXAM: CT ABDOMEN AND PELVIS WITH CONTRAST TECHNIQUE: Multidetector CT imaging of the abdomen and pelvis was performed using the standard protocol following bolus administration of intravenous contrast. CONTRAST:  50mL OMNIPAQUE IOHEXOL 300 MG/ML  SOLN COMPARISON:  None. FINDINGS: Lower chest: Minimal right pleural effusion is noted with adjacent subsegmental atelectasis. Hepatobiliary: No focal liver abnormality is seen. No gallstones, gallbladder wall thickening, or biliary dilatation. Pancreas: Unremarkable. No pancreatic ductal dilatation or surrounding inflammatory changes. Spleen: Normal in size without focal abnormality. Adrenals/Urinary Tract: Adrenal glands appear normal. Right renal cyst is noted. No hydronephrosis or renal obstruction is noted. No renal or ureteral calculi are noted. Urinary bladder is unremarkable. Stomach/Bowel: The stomach appears normal. There is no evidence of bowel obstruction or inflammation. Stool seen throughout the colon, including large amount of stool in the rectum. The appendix is not visualized. Vascular/Lymphatic: Aortic atherosclerosis. No  enlarged abdominal or pelvic lymph nodes. Reproductive: Status post hysterectomy. No adnexal masses. Other: No abdominal wall hernia or abnormality. No abdominopelvic ascites. Musculoskeletal: No acute or significant osseous findings. IMPRESSION: 1. Minimal right pleural effusion with adjacent subsegmental atelectasis. 2. Aortic atherosclerosis. 3. Stool seen throughout the colon, including large amount of stool in the rectum. 4. No other significant abnormality seen in the abdomen or pelvis. Aortic Atherosclerosis (ICD10-I70.0). Electronically Signed   By: Marijo Conception M.D.   On: 08/16/2019 13:48   DG Chest Port 1 View  Result Date: 08/21/2019 CLINICAL DATA:  Failure to thrive.  History of dementia. EXAM: PORTABLE CHEST 1 VIEW COMPARISON:  Single-view of the chest 08/19/2019. CT chest 05/04/2019. FINDINGS: The patient is rotated on the exam. Lungs are clear. Heart size is normal. No pneumothorax or pleural effusion. Surgical clips left axilla noted. No acute or focal bony abnormality. IMPRESSION: No acute disease. Electronically Signed   By: Inge Rise M.D.   On: 08/21/2019 13:59   DG Chest Port 1 View  Result Date: 08/19/2019 CLINICAL DATA:  Unwitnessed fall EXAM: PORTABLE CHEST 1 VIEW COMPARISON:  08/16/2019 FINDINGS: Heart size is mildly enlarged, unchanged. No focal consolidation, pleural effusion, or pneumothorax. IMPRESSION: No active disease. Electronically Signed   By: Marisue Brooklyn.D.  On: 08/19/2019 18:27      Discharge Exam: Vitals:   08/23/19 0032 08/24/19 0338  BP: 111/63 (!) 141/80  Pulse: 84 (!) 103  Resp: 18 18  Temp: (!) 97.4 F (36.3 C) 98.2 F (36.8 C)  SpO2: 90% 92%    GENERAL: No apparent distress.  Frail RESP:  No IWOB. CVS:  RRR. Heart sounds normal.  NEURO: Sleepy   The results of significant diagnostics from this hospitalization (including imaging, microbiology, ancillary and laboratory) are listed below for reference.      Microbiology: Recent Results (from the past 240 hour(s))  Urine culture     Status: Abnormal   Collection Time: 08/16/19  2:17 PM   Specimen: Urine, Catheterized  Result Value Ref Range Status   Specimen Description   Final    URINE, CATHETERIZED Performed at [facility] [address], [place], [place] [address]    Special Requests   Final    Normal Performed at [facility], [place] [address], [place], [place] [id number]    Culture (A)  Final    >=100,000 COLONIES/mL ESCHERICHIA COLI Confirmed Extended Spectrum Beta-Lactamase Producer (ESBL).  In bloodstream infections from ESBL organisms, carbapenems are preferred over piperacillin/tazobactam. They are shown to have a lower risk of mortality.    Report Status 08/21/2019 FINAL  Final   Organism ID, Bacteria ESCHERICHIA COLI (A)  Final      Susceptibility   Escherichia coli - MIC*    AMPICILLIN >=32 RESISTANT Resistant     CEFAZOLIN >=64 RESISTANT Resistant     CEFTRIAXONE RESISTANT Resistant     CIPROFLOXACIN >=4 RESISTANT Resistant     GENTAMICIN <=1 SENSITIVE Sensitive     IMIPENEM <=0.25 SENSITIVE Sensitive     NITROFURANTOIN <=16 SENSITIVE Sensitive     TRIMETH/SULFA >=320 RESISTANT Resistant     AMPICILLIN/SULBACTAM 4 SENSITIVE Sensitive     PIP/TAZO <=4 SENSITIVE Sensitive     * >=100,000 COLONIES/mL ESCHERICHIA COLI  SARS CORONAVIRUS 2 (TAT 6-24 HRS) Nasopharyngeal Nasopharyngeal Swab     Status: Abnormal   Collection Time: 08/21/19  4:08 PM   Specimen: Nasopharyngeal Swab  Result Value Ref Range Status   SARS Coronavirus 2 POSITIVE (A) NEGATIVE Final    Comment: RESULT CALLED TO, READ BACK BY AND VERIFIED WITH: RN ALEXIS FRAZIER AT 0255 BY MESSAN HOUEGNIFIO ON 08/22/2019 (NOTE) SARS-CoV-2 target nucleic acids are DETECTED. The SARS-CoV-2 RNA is generally detectable in upper and lower respiratory specimens during the acute phase of infection. Positive results are  indicative of the presence of SARS-CoV-2 RNA. Clinical correlation with patient history and other diagnostic information is  necessary to determine patient infection status. Positive results do not rule out bacterial infection or co-infection with other viruses.  The expected result is Negative. Fact Sheet for Patients: SugarRoll.be Fact Sheet for Healthcare Providers: https://www.woods-mathews.com/ This test is not yet approved or cleared by the [place] FDA and  has been authorized for detection and/or diagnosis of SARS-CoV-2 by FDA under an Emergency Use Authorization (EUA). This EUA will remain  in effect (meaning this  test can be used) for the duration of the COVID-19 declaration under Section 564(b)(1) of the Act, 21 U.S.C. section 360bbb-3(b)(1), unless the authorization is terminated or revoked sooner. Performed at [facility], [place] [address], [place], [place] [address]      Labs: BNP (last 3 results) Recent Labs    08/21/19 1957  BNP 0000000*   Basic Metabolic Panel: Recent  Labs  Lab 08/19/19 1641 08/21/19 1342 08/21/19 1407 08/22/19 0335  NA 148* 155* 154* 149*  K 4.5 4.4 4.0 4.0  CL 110 116* 117* 115*  CO2 24 26  --  23  GLUCOSE 137* 100* 93 139*  BUN 34* 56* 48* 46*  CREATININE 0.96 1.37* 1.40* 0.97  CALCIUM 8.5* 8.8*  --  8.4*  MG  --   --   --  2.7*  PHOS  --   --   --  2.4*   Liver Function Tests: Recent Labs  Lab 08/21/19 1342 08/22/19 0335  AST 56* 54*  ALT 29 29  ALKPHOS 35* 32*  BILITOT 0.6 0.6  PROT 6.3* 5.8*  ALBUMIN 2.9* 2.7*   No results for input(s): LIPASE, AMYLASE in the last 168 hours. No results for input(s): AMMONIA in the last 168 hours. CBC: Recent Labs  Lab 08/19/19 1641 08/21/19 1342 08/21/19 1407 08/22/19 0335  WBC 9.1 9.7  --  10.3  NEUTROABS 4.8 6.5  --  7.2  HGB 14.9 13.5 13.9 12.6  HCT 50.2* 45.3 41.0 42.7  MCV 87.3 86.5  --  87.0  PLT 224 226  --  229    Cardiac Enzymes: No results for input(s): CKTOTAL, CKMB, CKMBINDEX, TROPONINI in the last 168 hours. BNP: Invalid input(s): POCBNP CBG: Recent Labs  Lab 08/22/19 0905  GLUCAP 131*   D-Dimer Recent Labs    08/21/19 1957 08/22/19 0335  DDIMER 4.63* 3.67*   Hgb A1c No results for input(s): HGBA1C in the last 72 hours. Lipid Profile No results for input(s): CHOL, HDL, LDLCALC, TRIG, CHOLHDL, LDLDIRECT in the last 72 hours. Thyroid function studies No results for input(s): TSH, T4TOTAL, T3FREE, THYROIDAB in the last 72 hours.  Invalid input(s): FREET3 Anemia work up Recent Labs    08/21/19 1957 08/22/19 0335  FERRITIN 806* 887*   Urinalysis    Component Value Date/Time   COLORURINE AMBER (A) 08/21/2019 1608   APPEARANCEUR HAZY (A) 08/21/2019 1608   LABSPEC 1.028 08/21/2019 1608   PHURINE 5.0 08/21/2019 1608   GLUCOSEU NEGATIVE 08/21/2019 1608   HGBUR NEGATIVE 08/21/2019 1608   BILIRUBINUR NEGATIVE 08/21/2019 1608   BILIRUBINUR Negative 11/29/2017 1605   KETONESUR 5 (A) 08/21/2019 1608   PROTEINUR 30 (A) 08/21/2019 1608   UROBILINOGEN 0.2 11/29/2017 1605   UROBILINOGEN 0.2 10/08/2009 1326   NITRITE NEGATIVE 08/21/2019 1608   LEUKOCYTESUR NEGATIVE 08/21/2019 1608   Sepsis Labs Invalid input(s): PROCALCITONIN,  WBC,  LACTICIDVEN   Time coordinating discharge: 35 minutes  SIGNED:  Mercy Riding, MD  [name] 08/24/2019, 12:29 PM  If 7PM-7AM, please contact night-coverage [email or web address] Password [email or web address]

## 2019-08-24 NOTE — TOC Progression Note (Signed)
Transition of Care [facility]) - Progression Note    Patient Details  Name: Katie Silva MRN: [id number] Date of Birth: [birth year]  Transition of Care [facility]) CM/SW Contact  Purcell Mouton, RN Phone Number: 08/24/2019, 1:42 PM  Clinical Narrative:    Transportation was called.         Expected Discharge Plan and Services           Expected Discharge Date: 08/24/19                                     Social Determinants of Health (SDOH) Interventions    Readmission Risk Interventions No flowsheet data found.

## 2019-08-24 NOTE — Progress Notes (Signed)
Report called to [facility] at [facility]. Patient not interactive at this time. Will tranfser via [facility] to facility. CSW spoke with family to arrange transfer.

## 2019-08-24 NOTE — Progress Notes (Signed)
Pt picked up by [facility], sats 93% on RA and pt with no visible signs of distress at this time.

## 2019-08-24 NOTE — Progress Notes (Signed)
[facility]   Pt has been approved for COVID bed at [facility].  Family has accepted the bed which is available today. She will sign paper work around [place] will arrange ambulance at 200pm.   RN report number is [facility] RN [phone]

## 2019-09-08 DEATH — deceased

## 2020-02-07 IMAGING — CT CT HEAD W/O CM
4 series · 17 of 47 positions shown, 19 images · non-contrast
Comparison: Head CT dated 06/15/2016

CLINICAL DATA: [AGE] female with stroke-like symptoms.

EXAM:
CT HEAD WITHOUT CONTRAST
TECHNIQUE: Contiguous axial images were obtained from the base of the skull
through the vertex without intravenous contrast.

[Series 3: head bone · axial · 0.39mm/px · z∈[-628,-572]mm · 4 of 79 slices shown]
[im 8/79  bone]
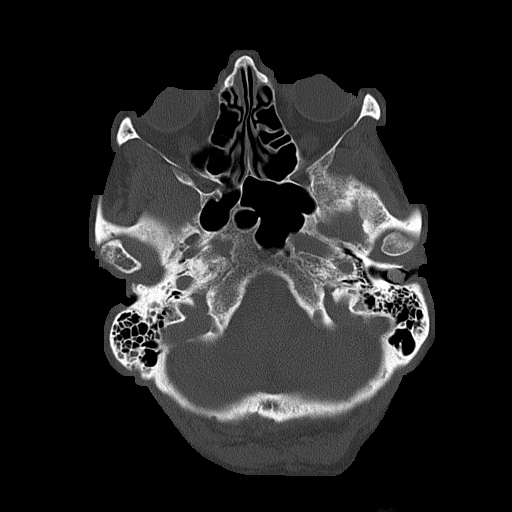
[im 16/79  bone]
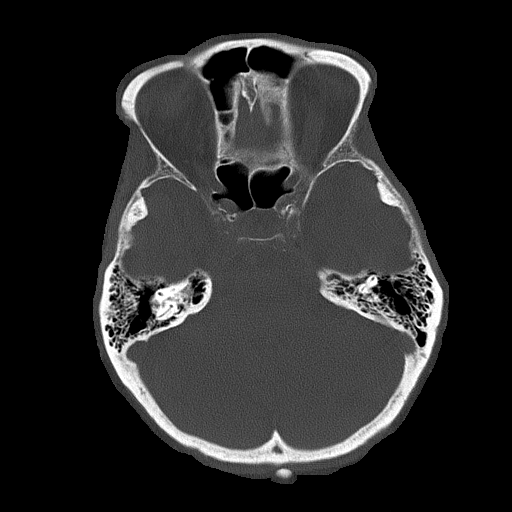
[im 24/79  bone]
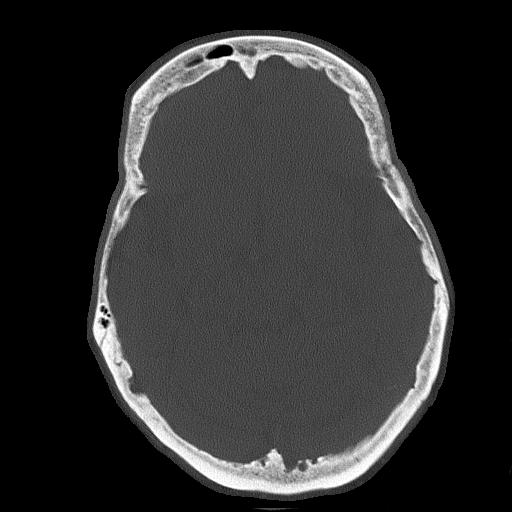
[im 36/79  bone]
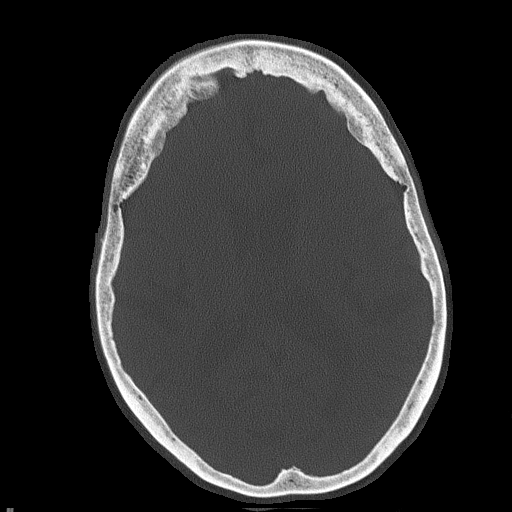

[Series 4: head without · axial · non-contrast · 0.39mm/px · z∈[-627,-507]mm · 7 of 32 slices shown, 9 images]
[im 4/32  brain]
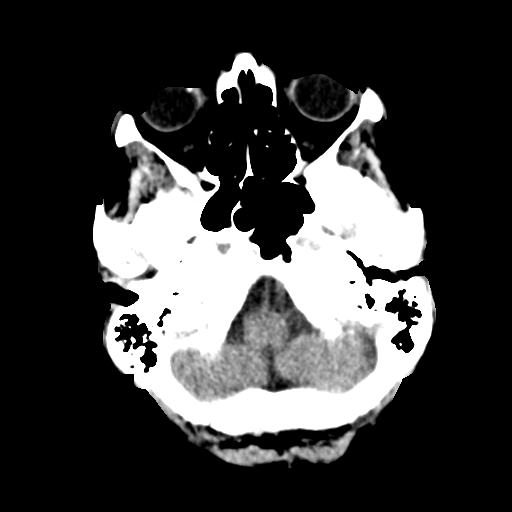
[im 4/32  bone]
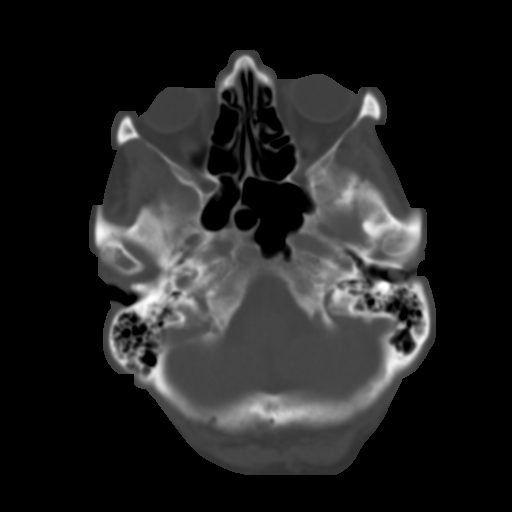
[im 8/32  brain]
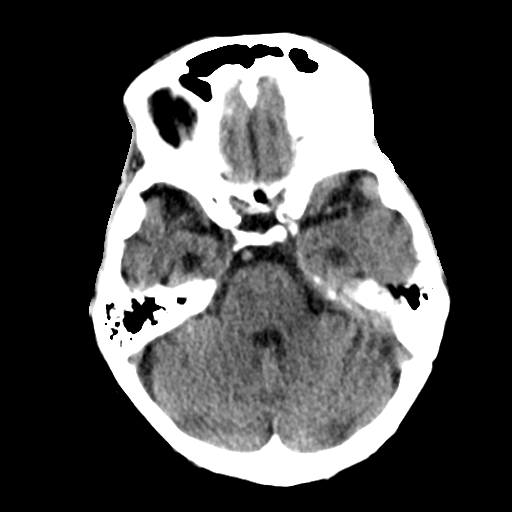
[im 12/32  brain]
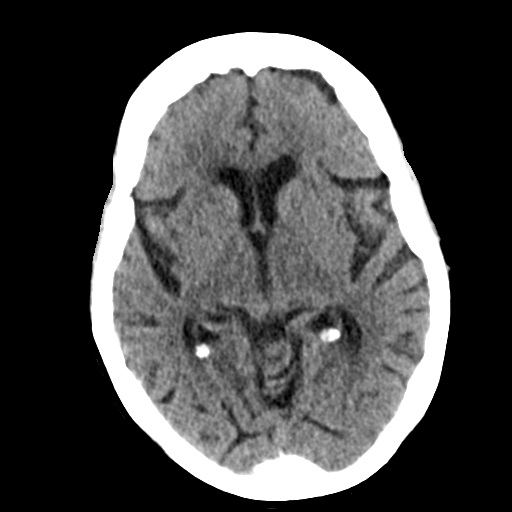
[im 16/32  brain]
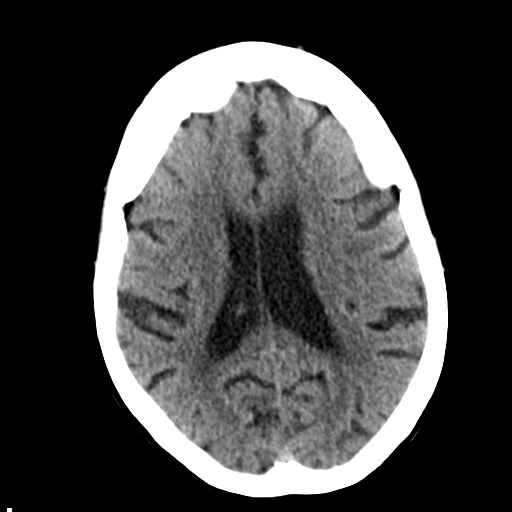
[im 20/32  brain]
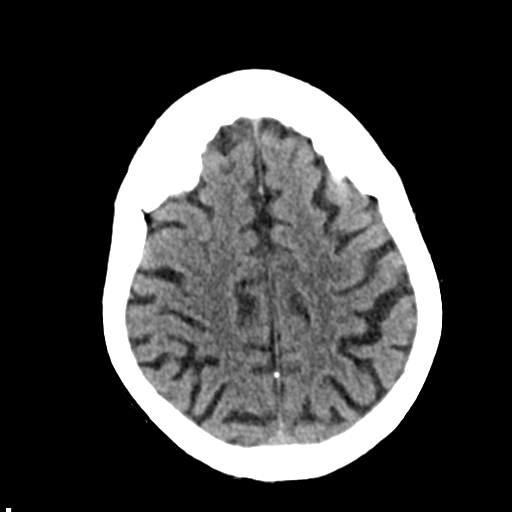
[im 20/32  bone]
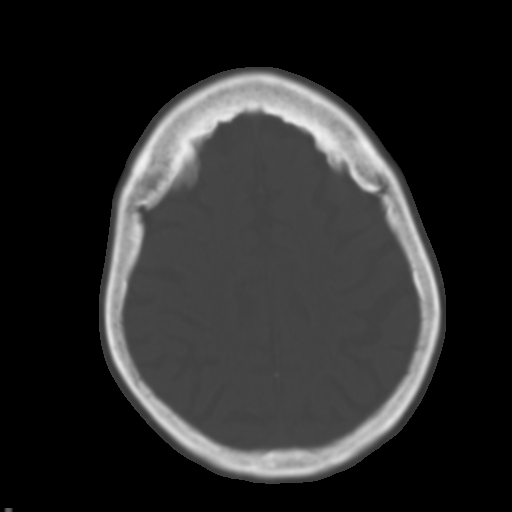
[im 24/32  brain]
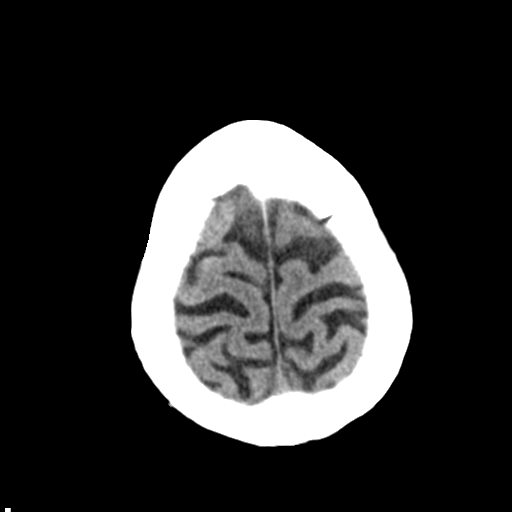
[im 28/32  brain]
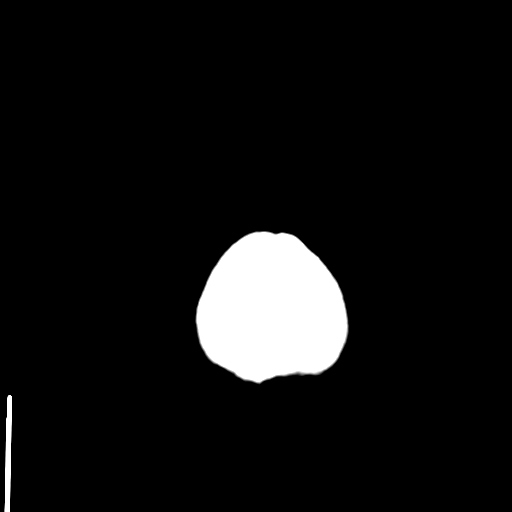

[Series 5: head without cor · coronal · non-contrast · 0.30mm/px · 3 of 67 slices shown]
[im 23/67  brain]
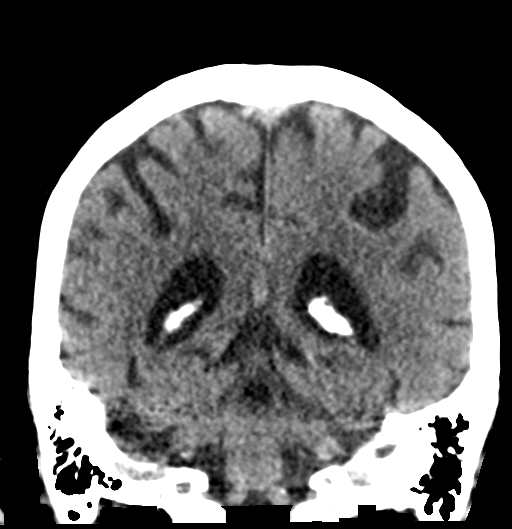
[im 30/67  brain]
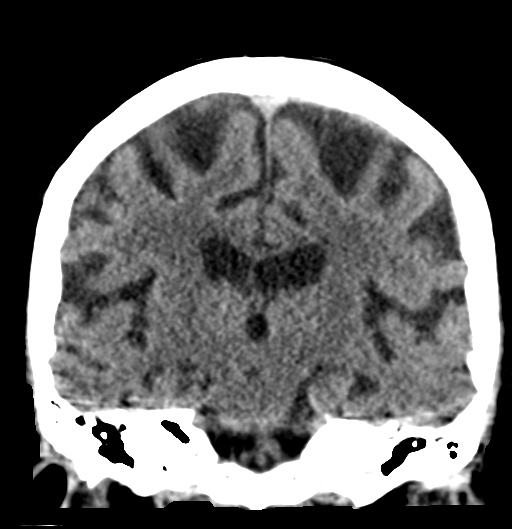
[im 37/67  brain]
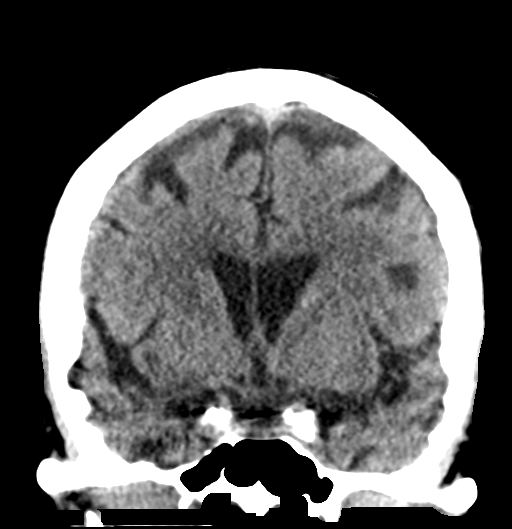

[Series 6: head without sag · sagittal · non-contrast · 0.31mm/px · 3 of 51 slices shown]
[im 17/51  brain]
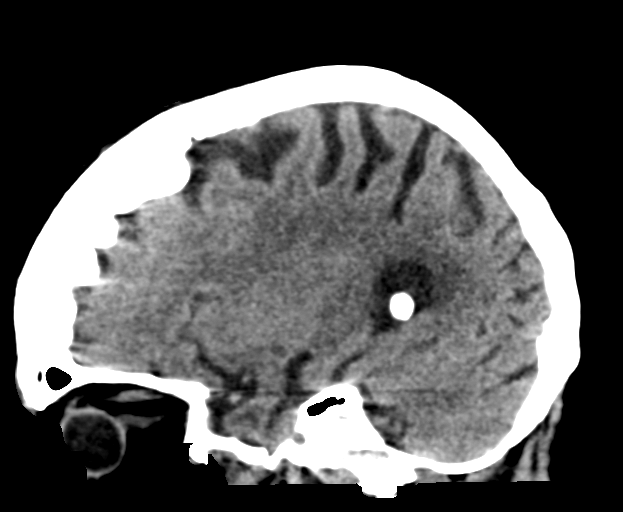
[im 26/51  brain]
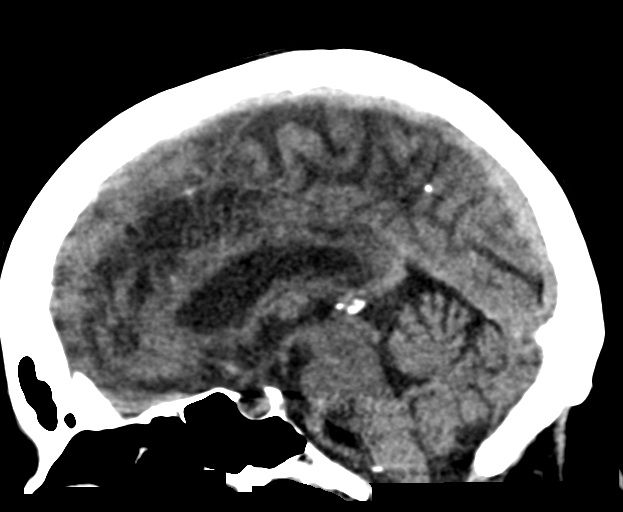
[im 34/51  brain]
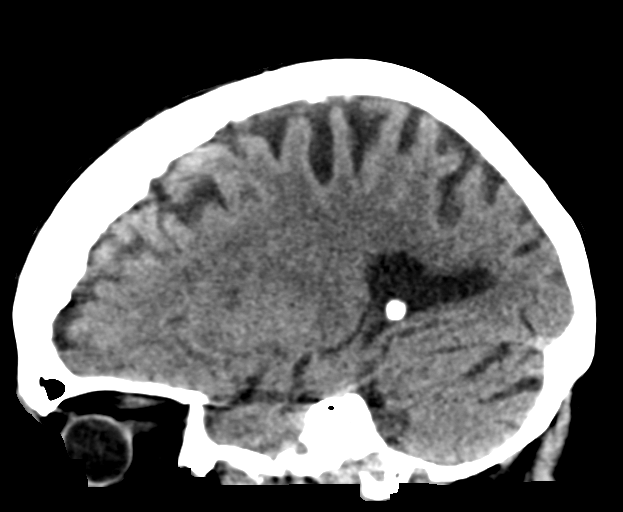

[17 of 47 positions shown; findings below may reference images not displayed]

FINDINGS: Brain: There is mild age-related atrophy and chronic microvascular
ischemic changes. A small area of old infarct and noted involving
the anterior horn of the left internal capsule. There is no acute
intracranial hemorrhage. No mass effect or midline shift. No
extra-axial fluid collection.

Vascular: No hyperdense vessel or unexpected calcification.

Skull: Normal. Negative for fracture or focal lesion.

Sinuses/Orbits: No acute finding.

Other: None
IMPRESSION: 1. No acute intracranial pathology.
2. Age-related atrophy and chronic microvascular ischemic changes.
